# Patient Record
Sex: Female | Born: 1960 | Race: White | Hispanic: No | Marital: Married | Smoking: Never smoker
Health system: Southern US, Community
[De-identification: ages and names within clinical notes are randomized; demographics above are authoritative.]

## PROBLEM LIST (undated history)

## (undated) DIAGNOSIS — F319 Bipolar disorder, unspecified: Secondary | ICD-10-CM

## (undated) DIAGNOSIS — T7840XA Allergy, unspecified, initial encounter: Secondary | ICD-10-CM

## (undated) DIAGNOSIS — I10 Essential (primary) hypertension: Secondary | ICD-10-CM

## (undated) DIAGNOSIS — F32A Depression, unspecified: Secondary | ICD-10-CM

## (undated) DIAGNOSIS — K219 Gastro-esophageal reflux disease without esophagitis: Secondary | ICD-10-CM

## (undated) DIAGNOSIS — K589 Irritable bowel syndrome without diarrhea: Secondary | ICD-10-CM

## (undated) DIAGNOSIS — M545 Low back pain, unspecified: Secondary | ICD-10-CM

## (undated) DIAGNOSIS — S3992XA Unspecified injury of lower back, initial encounter: Secondary | ICD-10-CM

## (undated) DIAGNOSIS — G8929 Other chronic pain: Secondary | ICD-10-CM

## (undated) DIAGNOSIS — R51 Headache: Secondary | ICD-10-CM

## (undated) DIAGNOSIS — M797 Fibromyalgia: Secondary | ICD-10-CM

## (undated) DIAGNOSIS — F329 Major depressive disorder, single episode, unspecified: Secondary | ICD-10-CM

## (undated) HISTORY — DX: Allergy, unspecified, initial encounter: T78.40XA

## (undated) HISTORY — DX: Low back pain: M54.5

## (undated) HISTORY — PX: CARPAL TUNNEL RELEASE: SHX101

## (undated) HISTORY — PX: NASAL SINUS SURGERY: SHX719

## (undated) HISTORY — DX: Gastro-esophageal reflux disease without esophagitis: K21.9

## (undated) HISTORY — PX: OTHER SURGICAL HISTORY: SHX169

## (undated) HISTORY — DX: Depression, unspecified: F32.A

## (undated) HISTORY — DX: Irritable bowel syndrome, unspecified: K58.9

## (undated) HISTORY — DX: Headache: R51

## (undated) HISTORY — PX: CERVICAL SPINE SURGERY: SHX589

## (undated) HISTORY — DX: Essential (primary) hypertension: I10

## (undated) HISTORY — DX: Other chronic pain: G89.29

## (undated) HISTORY — DX: Fibromyalgia: M79.7

## (undated) HISTORY — DX: Low back pain, unspecified: M54.50

## (undated) HISTORY — DX: Bipolar disorder, unspecified: F31.9

## (undated) HISTORY — PX: TRIGGER FINGER RELEASE: SHX641

## (undated) HISTORY — DX: Major depressive disorder, single episode, unspecified: F32.9

## (undated) HISTORY — PX: CHOLECYSTECTOMY: SHX55

## (undated) HISTORY — PX: APPENDECTOMY: SHX54

## (undated) HISTORY — DX: Unspecified injury of lower back, initial encounter: S39.92XA

---

## 1994-09-20 HISTORY — PX: OTHER SURGICAL HISTORY: SHX169

## 1998-01-21 ENCOUNTER — Other Ambulatory Visit: Admission: RE | Admit: 1998-01-21 | Discharge: 1998-01-21 | Payer: Self-pay | Admitting: Gynecology

## 1998-07-28 ENCOUNTER — Emergency Department (HOSPITAL_COMMUNITY): Admission: EM | Admit: 1998-07-28 | Discharge: 1998-07-28 | Payer: Self-pay | Admitting: Emergency Medicine

## 1998-07-29 ENCOUNTER — Encounter: Payer: Self-pay | Admitting: Emergency Medicine

## 1998-07-29 ENCOUNTER — Emergency Department (HOSPITAL_COMMUNITY): Admission: RE | Admit: 1998-07-29 | Discharge: 1998-07-29 | Payer: Self-pay | Admitting: Emergency Medicine

## 1999-01-21 ENCOUNTER — Other Ambulatory Visit: Admission: RE | Admit: 1999-01-21 | Discharge: 1999-01-21 | Payer: Self-pay | Admitting: Gynecology

## 1999-05-27 ENCOUNTER — Ambulatory Visit (HOSPITAL_COMMUNITY): Admission: RE | Admit: 1999-05-27 | Discharge: 1999-05-27 | Payer: Self-pay | Admitting: Gastroenterology

## 1999-05-27 ENCOUNTER — Encounter: Payer: Self-pay | Admitting: Gastroenterology

## 1999-06-05 ENCOUNTER — Encounter: Payer: Self-pay | Admitting: Gastroenterology

## 1999-06-05 ENCOUNTER — Ambulatory Visit (HOSPITAL_COMMUNITY): Admission: RE | Admit: 1999-06-05 | Discharge: 1999-06-05 | Payer: Self-pay | Admitting: Gastroenterology

## 1999-08-31 ENCOUNTER — Ambulatory Visit (HOSPITAL_BASED_OUTPATIENT_CLINIC_OR_DEPARTMENT_OTHER): Admission: RE | Admit: 1999-08-31 | Discharge: 1999-08-31 | Payer: Self-pay | Admitting: Orthopedic Surgery

## 1999-11-09 ENCOUNTER — Ambulatory Visit: Admission: RE | Admit: 1999-11-09 | Discharge: 1999-11-09 | Payer: Self-pay | Admitting: *Deleted

## 2000-02-02 ENCOUNTER — Other Ambulatory Visit: Admission: RE | Admit: 2000-02-02 | Discharge: 2000-02-02 | Payer: Self-pay | Admitting: Gynecology

## 2000-12-15 ENCOUNTER — Encounter: Payer: Self-pay | Admitting: Gastroenterology

## 2000-12-15 ENCOUNTER — Encounter: Admission: RE | Admit: 2000-12-15 | Discharge: 2000-12-15 | Payer: Self-pay | Admitting: Gastroenterology

## 2001-01-18 ENCOUNTER — Ambulatory Visit (HOSPITAL_COMMUNITY): Admission: RE | Admit: 2001-01-18 | Discharge: 2001-01-18 | Payer: Self-pay | Admitting: Gastroenterology

## 2001-01-18 ENCOUNTER — Encounter: Payer: Self-pay | Admitting: Gastroenterology

## 2001-01-18 ENCOUNTER — Encounter: Admission: RE | Admit: 2001-01-18 | Discharge: 2001-04-18 | Payer: Self-pay | Admitting: Anesthesiology

## 2001-02-10 ENCOUNTER — Encounter (INDEPENDENT_AMBULATORY_CARE_PROVIDER_SITE_OTHER): Payer: Self-pay | Admitting: *Deleted

## 2001-02-10 ENCOUNTER — Ambulatory Visit (HOSPITAL_COMMUNITY): Admission: RE | Admit: 2001-02-10 | Discharge: 2001-02-11 | Payer: Self-pay | Admitting: General Surgery

## 2001-02-10 ENCOUNTER — Encounter (HOSPITAL_BASED_OUTPATIENT_CLINIC_OR_DEPARTMENT_OTHER): Payer: Self-pay | Admitting: General Surgery

## 2001-03-21 ENCOUNTER — Other Ambulatory Visit: Admission: RE | Admit: 2001-03-21 | Discharge: 2001-03-21 | Payer: Self-pay | Admitting: Gynecology

## 2002-05-02 ENCOUNTER — Other Ambulatory Visit: Admission: RE | Admit: 2002-05-02 | Discharge: 2002-05-02 | Payer: Self-pay | Admitting: Gynecology

## 2002-07-26 ENCOUNTER — Encounter: Payer: Self-pay | Admitting: Emergency Medicine

## 2002-07-26 ENCOUNTER — Emergency Department (HOSPITAL_COMMUNITY): Admission: EM | Admit: 2002-07-26 | Discharge: 2002-07-26 | Payer: Self-pay | Admitting: Emergency Medicine

## 2003-05-29 ENCOUNTER — Other Ambulatory Visit: Admission: RE | Admit: 2003-05-29 | Discharge: 2003-05-29 | Payer: Self-pay | Admitting: Gynecology

## 2003-11-29 ENCOUNTER — Encounter: Admission: RE | Admit: 2003-11-29 | Discharge: 2003-11-29 | Payer: Self-pay | Admitting: Orthopedic Surgery

## 2003-12-10 ENCOUNTER — Encounter: Admission: RE | Admit: 2003-12-10 | Discharge: 2003-12-10 | Payer: Self-pay | Admitting: Orthopedic Surgery

## 2004-02-27 ENCOUNTER — Ambulatory Visit (HOSPITAL_COMMUNITY): Admission: RE | Admit: 2004-02-27 | Discharge: 2004-02-28 | Payer: Self-pay | Admitting: Orthopaedic Surgery

## 2004-05-19 ENCOUNTER — Other Ambulatory Visit: Admission: RE | Admit: 2004-05-19 | Discharge: 2004-05-19 | Payer: Self-pay | Admitting: Gynecology

## 2004-07-28 ENCOUNTER — Ambulatory Visit: Payer: Self-pay | Admitting: Family Medicine

## 2004-08-05 ENCOUNTER — Encounter: Admission: RE | Admit: 2004-08-05 | Discharge: 2004-08-05 | Payer: Self-pay | Admitting: Family Medicine

## 2004-08-10 ENCOUNTER — Ambulatory Visit: Payer: Self-pay | Admitting: Family Medicine

## 2004-10-15 ENCOUNTER — Ambulatory Visit: Payer: Self-pay | Admitting: Family Medicine

## 2004-11-14 ENCOUNTER — Ambulatory Visit: Payer: Self-pay | Admitting: Family Medicine

## 2005-01-06 ENCOUNTER — Ambulatory Visit: Payer: Self-pay | Admitting: Family Medicine

## 2005-03-20 IMAGING — CR DG CHEST 2V
2 series · 2 of 2 positions shown · non-contrast
Comparison: none

CLINICAL DATA: Asthma.  Hypertension, C5-6 disk herniation, preoperative evaluation.
 CHEST TWO VIEWS
 Comparison report from [HOSPITAL] on 07/26/02.
 Normal sized heart.  Diffuse accentuation of the interstitial markings.  Cholecystectomy clips.  Unremarkable bones.  
 IMPRESSION
 Mild to moderate chronic interstitial lung disease.

[view not recorded (1 of 2)]
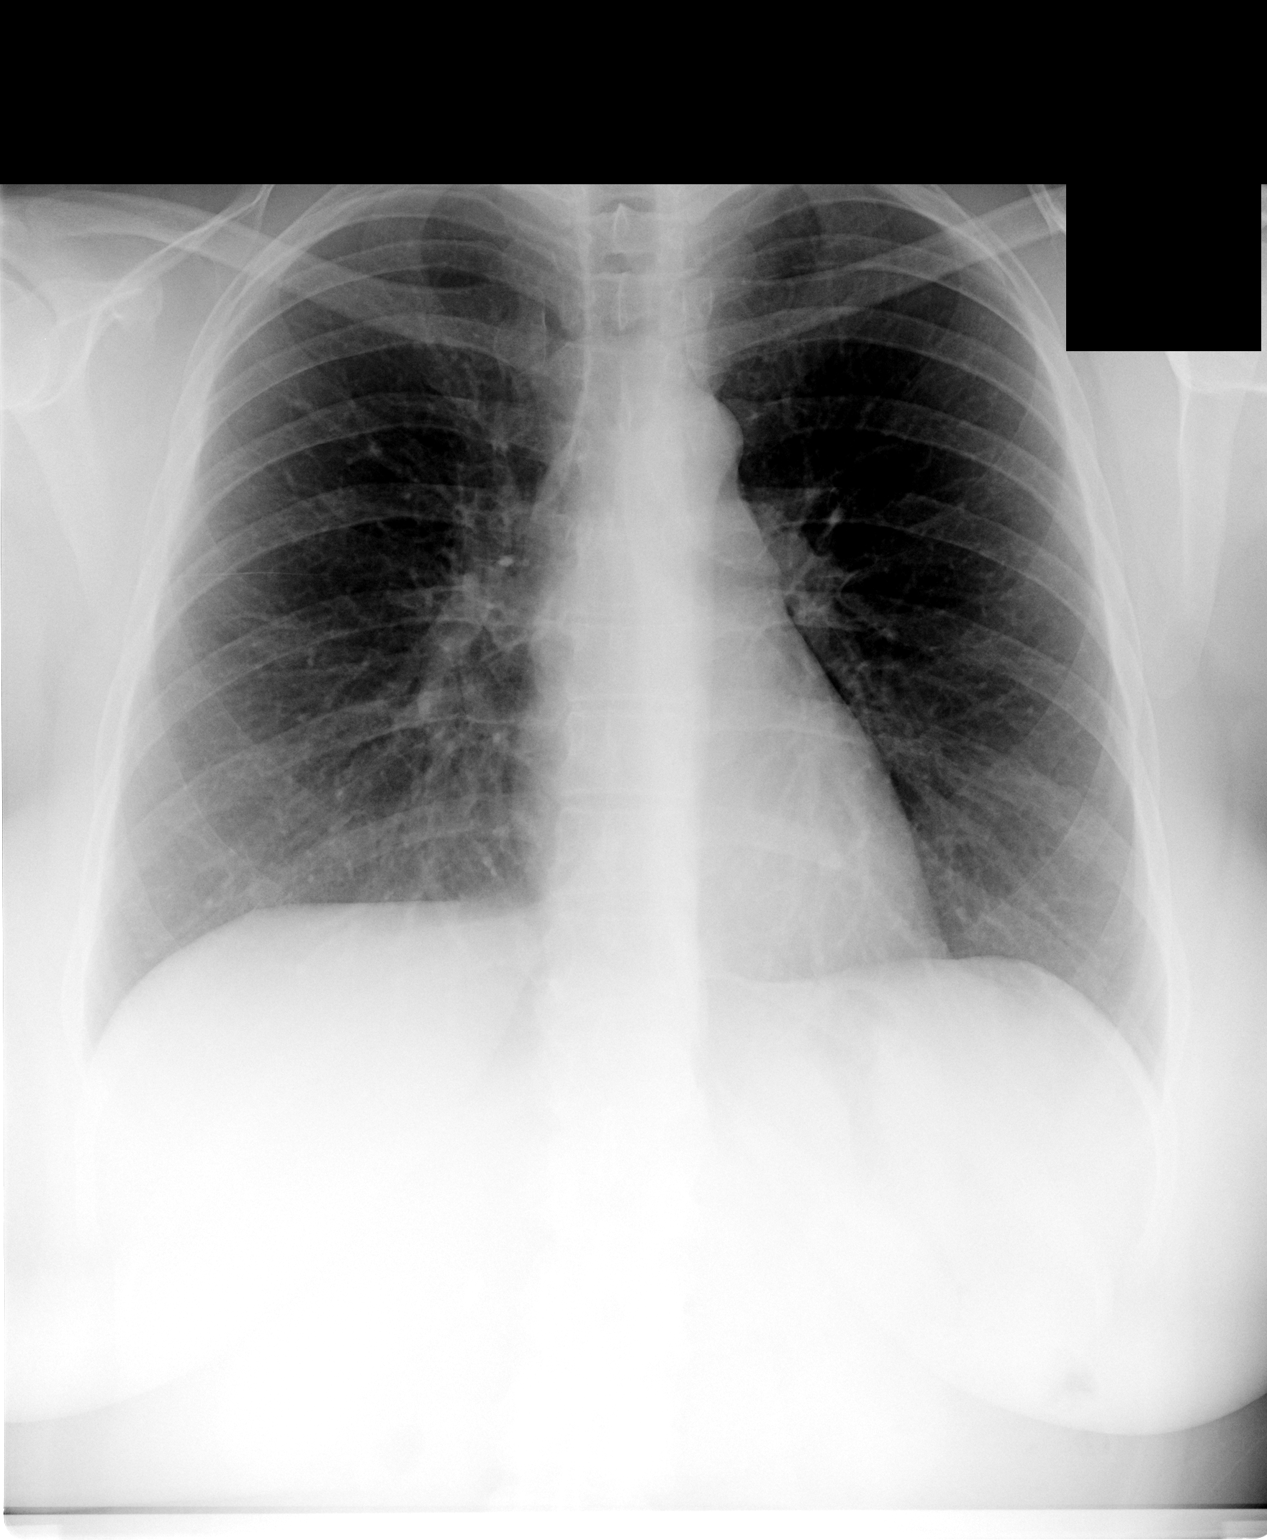

[view not recorded (2 of 2)]
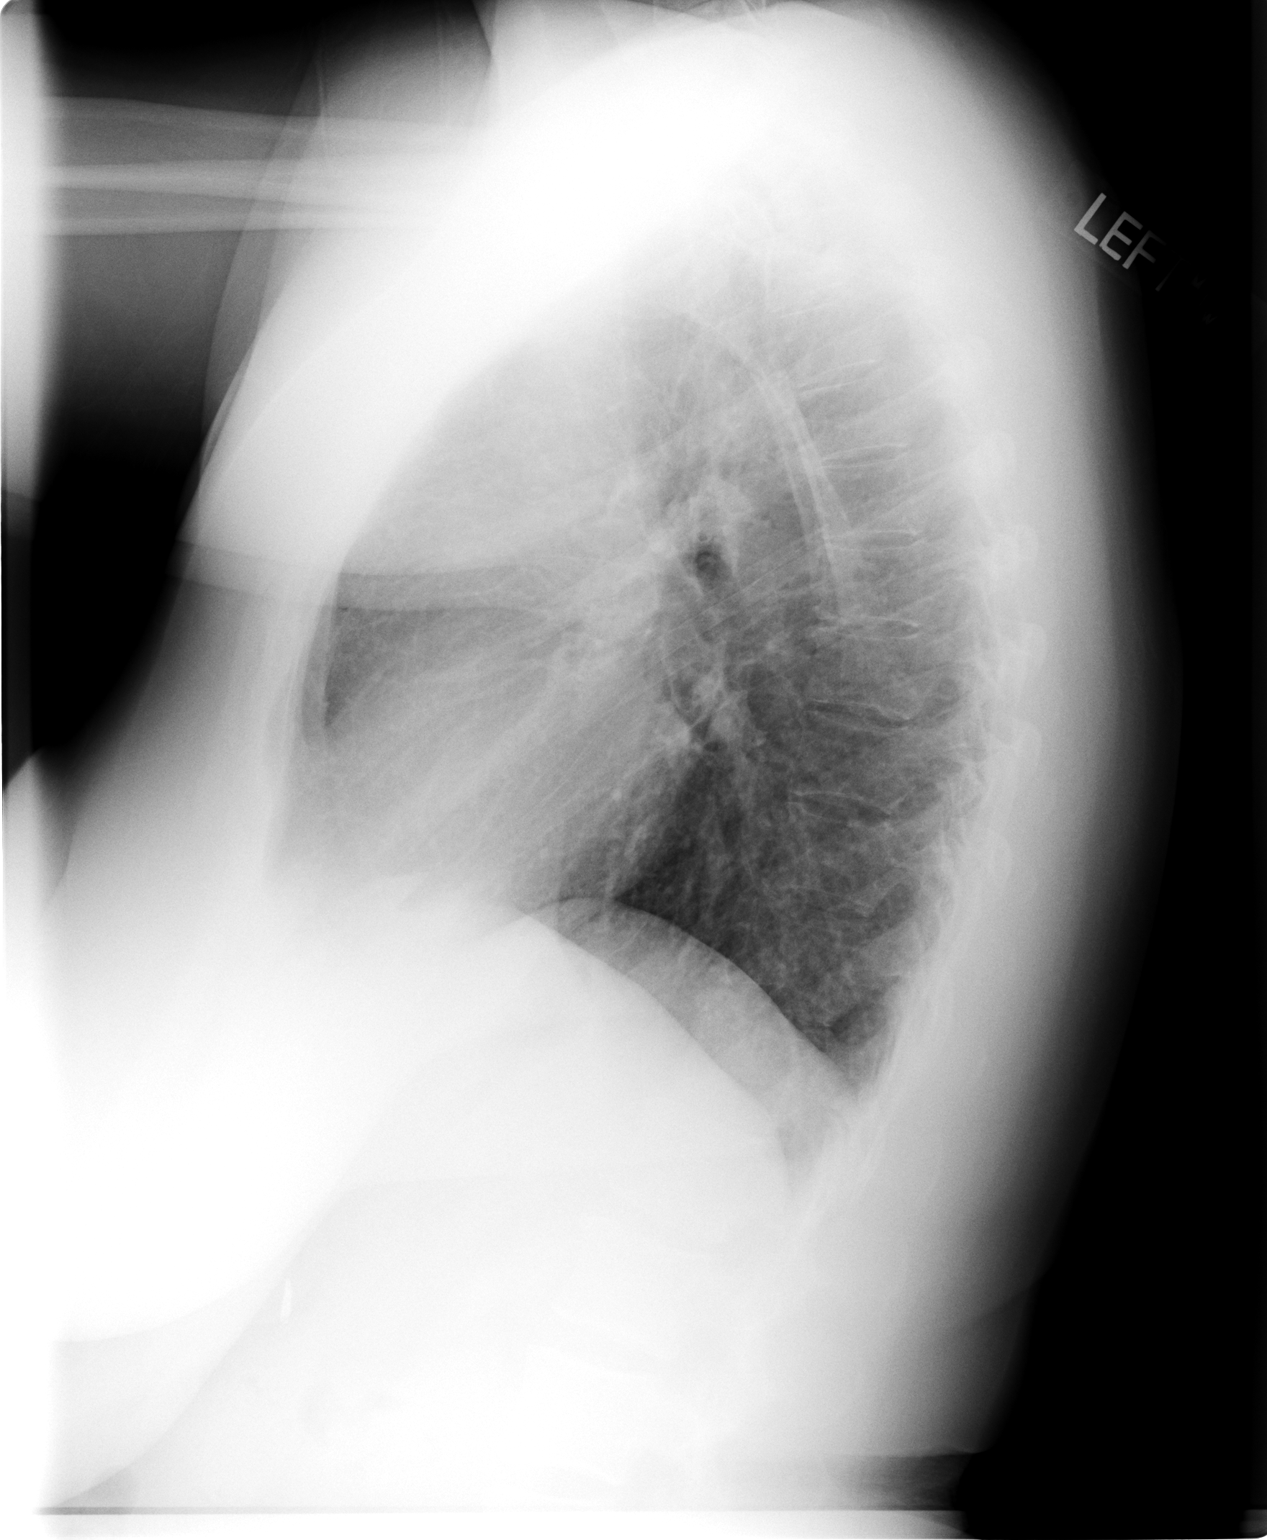

[2 of 2 positions shown; findings below may reference images not displayed]

## 2005-04-02 ENCOUNTER — Ambulatory Visit: Payer: Self-pay | Admitting: Family Medicine

## 2005-06-03 ENCOUNTER — Other Ambulatory Visit: Admission: RE | Admit: 2005-06-03 | Discharge: 2005-06-03 | Payer: Self-pay | Admitting: Gynecology

## 2005-06-24 ENCOUNTER — Ambulatory Visit: Payer: Self-pay | Admitting: Family Medicine

## 2005-07-07 ENCOUNTER — Ambulatory Visit: Payer: Self-pay | Admitting: Family Medicine

## 2005-07-09 ENCOUNTER — Ambulatory Visit: Payer: Self-pay | Admitting: Family Medicine

## 2005-08-05 ENCOUNTER — Ambulatory Visit: Payer: Self-pay | Admitting: Family Medicine

## 2005-09-15 ENCOUNTER — Ambulatory Visit: Payer: Self-pay | Admitting: Family Medicine

## 2005-09-23 DIAGNOSIS — S3992XA Unspecified injury of lower back, initial encounter: Secondary | ICD-10-CM

## 2005-09-23 HISTORY — DX: Unspecified injury of lower back, initial encounter: S39.92XA

## 2005-12-07 ENCOUNTER — Ambulatory Visit: Payer: Self-pay | Admitting: Family Medicine

## 2006-02-28 ENCOUNTER — Ambulatory Visit: Payer: Self-pay | Admitting: Family Medicine

## 2006-04-27 ENCOUNTER — Ambulatory Visit: Payer: Self-pay | Admitting: Family Medicine

## 2006-06-20 ENCOUNTER — Other Ambulatory Visit: Admission: RE | Admit: 2006-06-20 | Discharge: 2006-06-20 | Payer: Self-pay | Admitting: Gynecology

## 2006-07-01 ENCOUNTER — Ambulatory Visit (HOSPITAL_BASED_OUTPATIENT_CLINIC_OR_DEPARTMENT_OTHER): Admission: RE | Admit: 2006-07-01 | Discharge: 2006-07-01 | Payer: Self-pay | Admitting: Orthopedic Surgery

## 2006-07-11 ENCOUNTER — Ambulatory Visit: Payer: Self-pay | Admitting: Family Medicine

## 2006-07-11 LAB — CONVERTED CEMR LAB
ALT: 23 units/L (ref 0–40)
AST: 21 units/L (ref 0–37)
Albumin: 4.3 g/dL (ref 3.5–5.2)
Alkaline Phosphatase: 78 units/L (ref 39–117)
BUN: 7 mg/dL (ref 6–23)
Basophils Absolute: 0 10*3/uL (ref 0.0–0.1)
Basophils Relative: 0.5 % (ref 0.0–1.0)
CO2: 28 meq/L (ref 19–32)
Calcium: 9.7 mg/dL (ref 8.4–10.5)
Chloride: 111 meq/L (ref 96–112)
Chol/HDL Ratio, serum: 5.3
Cholesterol: 170 mg/dL (ref 0–200)
Creatinine, Ser: 1 mg/dL (ref 0.4–1.2)
Eosinophil percent: 0.8 % (ref 0.0–5.0)
GFR calc non Af Amer: 64 mL/min
Glomerular Filtration Rate, Af Am: 78 mL/min/{1.73_m2}
Glucose, Bld: 87 mg/dL (ref 70–99)
HCT: 43.4 % (ref 36.0–46.0)
HDL: 31.8 mg/dL — ABNORMAL LOW (ref >39.0)
Hemoglobin: 14.5 g/dL (ref 12.0–15.0)
LDL Cholesterol: 111 mg/dL — ABNORMAL HIGH (ref 0–99)
Lymphocytes Relative: 35.5 % (ref 12.0–46.0)
MCHC: 33.3 g/dL (ref 30.0–36.0)
MCV: 93.2 fL (ref 78.0–100.0)
Monocytes Absolute: 0.7 10*3/uL (ref 0.2–0.7)
Monocytes Relative: 8.1 % (ref 3.0–11.0)
Neutro Abs: 4.9 10*3/uL (ref 1.4–7.7)
Neutrophils Relative %: 55.1 % (ref 43.0–77.0)
Platelets: 310 10*3/uL (ref 150–400)
Potassium: 4 meq/L (ref 3.5–5.1)
RBC: 4.66 M/uL (ref 3.87–5.11)
RDW: 12.3 % (ref 11.5–14.6)
Sodium: 145 meq/L (ref 135–145)
TSH: 2.35 microintl units/mL (ref 0.35–5.50)
Total Bilirubin: 0.6 mg/dL (ref 0.3–1.2)
Total Protein: 7.1 g/dL (ref 6.0–8.3)
Triglyceride fasting, serum: 136 mg/dL (ref 0–149)
VLDL: 27 mg/dL (ref 0–40)
WBC: 8.8 10*3/uL (ref 4.5–10.5)

## 2006-07-19 ENCOUNTER — Ambulatory Visit: Payer: Self-pay | Admitting: Family Medicine

## 2006-08-03 ENCOUNTER — Ambulatory Visit: Payer: Self-pay | Admitting: Pain Medicine

## 2006-08-16 ENCOUNTER — Ambulatory Visit: Payer: Self-pay | Admitting: Family Medicine

## 2006-10-10 ENCOUNTER — Ambulatory Visit: Payer: Self-pay | Admitting: Family Medicine

## 2006-10-14 ENCOUNTER — Ambulatory Visit: Payer: Self-pay | Admitting: Family Medicine

## 2006-10-17 ENCOUNTER — Ambulatory Visit: Payer: Self-pay | Admitting: Family Medicine

## 2006-11-07 ENCOUNTER — Ambulatory Visit: Payer: Self-pay | Admitting: Family Medicine

## 2006-11-09 ENCOUNTER — Ambulatory Visit: Payer: Self-pay | Admitting: Pain Medicine

## 2006-11-17 ENCOUNTER — Ambulatory Visit: Payer: Self-pay | Admitting: Pain Medicine

## 2006-11-30 ENCOUNTER — Encounter: Payer: Self-pay | Admitting: Pain Medicine

## 2006-12-05 ENCOUNTER — Ambulatory Visit: Payer: Self-pay | Admitting: Physician Assistant

## 2006-12-20 ENCOUNTER — Encounter: Payer: Self-pay | Admitting: Pain Medicine

## 2006-12-22 ENCOUNTER — Ambulatory Visit: Payer: Self-pay | Admitting: Pain Medicine

## 2007-01-04 ENCOUNTER — Ambulatory Visit: Payer: Self-pay | Admitting: Physician Assistant

## 2007-01-19 ENCOUNTER — Ambulatory Visit: Payer: Self-pay | Admitting: Pain Medicine

## 2007-01-26 ENCOUNTER — Ambulatory Visit: Payer: Self-pay | Admitting: Pain Medicine

## 2007-01-30 ENCOUNTER — Ambulatory Visit: Payer: Self-pay | Admitting: Family Medicine

## 2007-02-28 ENCOUNTER — Ambulatory Visit: Payer: Self-pay | Admitting: Physician Assistant

## 2007-03-13 DIAGNOSIS — J309 Allergic rhinitis, unspecified: Secondary | ICD-10-CM | POA: Insufficient documentation

## 2007-03-13 DIAGNOSIS — F329 Major depressive disorder, single episode, unspecified: Secondary | ICD-10-CM

## 2007-03-13 DIAGNOSIS — R51 Headache: Secondary | ICD-10-CM

## 2007-03-13 DIAGNOSIS — J45909 Unspecified asthma, uncomplicated: Secondary | ICD-10-CM | POA: Insufficient documentation

## 2007-03-13 DIAGNOSIS — R519 Headache, unspecified: Secondary | ICD-10-CM | POA: Insufficient documentation

## 2007-03-13 DIAGNOSIS — K589 Irritable bowel syndrome without diarrhea: Secondary | ICD-10-CM

## 2007-03-13 DIAGNOSIS — M797 Fibromyalgia: Secondary | ICD-10-CM | POA: Insufficient documentation

## 2007-03-13 DIAGNOSIS — A6000 Herpesviral infection of urogenital system, unspecified: Secondary | ICD-10-CM | POA: Insufficient documentation

## 2007-03-29 ENCOUNTER — Ambulatory Visit: Admit: 2007-03-29 | Disposition: A | Discharge status: Home / Self Care | Payer: Self-pay | Admitting: Pain Medicine

## 2007-03-29 ENCOUNTER — Ambulatory Visit: Payer: Self-pay | Admitting: Physician Assistant

## 2007-04-06 ENCOUNTER — Ambulatory Visit: Payer: Self-pay | Admitting: Pain Medicine

## 2007-04-24 ENCOUNTER — Ambulatory Visit: Payer: Self-pay | Admitting: Family Medicine

## 2007-04-24 DIAGNOSIS — E876 Hypokalemia: Secondary | ICD-10-CM

## 2007-04-24 DIAGNOSIS — B370 Candidal stomatitis: Secondary | ICD-10-CM

## 2007-04-26 LAB — CONVERTED CEMR LAB
BUN: 10 mg/dL (ref 6–23)
CO2: 25 meq/L (ref 19–32)
Calcium: 9.2 mg/dL (ref 8.4–10.5)
Chloride: 109 meq/L (ref 96–112)
Creatinine, Ser: 0.8 mg/dL (ref 0.4–1.2)
GFR calc Af Amer: 100 mL/min
GFR calc non Af Amer: 83 mL/min
Glucose, Bld: 80 mg/dL (ref 70–99)
Potassium: 4.1 meq/L (ref 3.5–5.1)
Sodium: 142 meq/L (ref 135–145)

## 2007-04-27 ENCOUNTER — Ambulatory Visit: Payer: Self-pay | Admitting: Physician Assistant

## 2007-06-06 ENCOUNTER — Encounter: Payer: Self-pay | Admitting: Maternal & Fetal Medicine

## 2007-06-12 ENCOUNTER — Ambulatory Visit: Payer: Self-pay | Admitting: Physician Assistant

## 2007-06-21 ENCOUNTER — Encounter: Payer: Self-pay | Admitting: Maternal & Fetal Medicine

## 2007-06-26 ENCOUNTER — Telehealth: Payer: Self-pay | Admitting: Family Medicine

## 2007-06-27 ENCOUNTER — Telehealth: Payer: Self-pay | Admitting: Family Medicine

## 2007-06-28 ENCOUNTER — Telehealth: Payer: Self-pay | Admitting: Family Medicine

## 2007-07-11 ENCOUNTER — Ambulatory Visit: Payer: Self-pay | Admitting: Pain Medicine

## 2007-07-14 ENCOUNTER — Ambulatory Visit: Payer: Self-pay | Admitting: Family Medicine

## 2007-07-18 LAB — CONVERTED CEMR LAB
ALT: 116 units/L — ABNORMAL HIGH (ref 0–35)
AST: 115 units/L — ABNORMAL HIGH (ref 0–37)
Albumin: 4 g/dL (ref 3.5–5.2)
Alkaline Phosphatase: 90 units/L (ref 39–117)
BUN: 7 mg/dL (ref 6–23)
Basophils Absolute: 0 10*3/uL (ref 0.0–0.1)
Basophils Relative: 0 % (ref 0.0–1.0)
Bilirubin, Direct: 0.2 mg/dL (ref 0.0–0.3)
CO2: 29 meq/L (ref 19–32)
Calcium: 9.5 mg/dL (ref 8.4–10.5)
Chloride: 104 meq/L (ref 96–112)
Cholesterol: 176 mg/dL (ref 0–200)
Creatinine, Ser: 0.8 mg/dL (ref 0.4–1.2)
Direct LDL: 105.7 mg/dL
Eosinophils Absolute: 0 10*3/uL (ref 0.0–0.6)
Eosinophils Relative: 0.1 % (ref 0.0–5.0)
GFR calc Af Amer: 100 mL/min
GFR calc non Af Amer: 83 mL/min
Glucose, Bld: 93 mg/dL (ref 70–99)
HCT: 41.5 % (ref 36.0–46.0)
HDL: 38.8 mg/dL — ABNORMAL LOW (ref >39.0)
Hemoglobin: 14.7 g/dL (ref 12.0–15.0)
Lymphocytes Relative: 22.2 % (ref 12.0–46.0)
MCHC: 35.4 g/dL (ref 30.0–36.0)
MCV: 90.5 fL (ref 78.0–100.0)
Monocytes Absolute: 1.1 10*3/uL — ABNORMAL HIGH (ref 0.2–0.7)
Monocytes Relative: 8.7 % (ref 3.0–11.0)
Neutro Abs: 8.5 10*3/uL — ABNORMAL HIGH (ref 1.4–7.7)
Neutrophils Relative %: 69 % (ref 43.0–77.0)
Platelets: 321 10*3/uL (ref 150–400)
Potassium: 5 meq/L (ref 3.5–5.1)
RBC: 4.59 M/uL (ref 3.87–5.11)
RDW: 11.5 % (ref 11.5–14.6)
Sodium: 138 meq/L (ref 135–145)
TSH: 1.41 microintl units/mL (ref 0.35–5.50)
Total Bilirubin: 0.6 mg/dL (ref 0.3–1.2)
Total CHOL/HDL Ratio: 4.5
Total Protein: 6.8 g/dL (ref 6.0–8.3)
Triglycerides: 229 mg/dL (ref 0–149)
VLDL: 46 mg/dL — ABNORMAL HIGH (ref 0–40)
WBC: 12.4 10*3/uL — ABNORMAL HIGH (ref 4.5–10.5)

## 2007-07-22 ENCOUNTER — Encounter: Payer: Self-pay | Admitting: Maternal & Fetal Medicine

## 2007-08-07 ENCOUNTER — Other Ambulatory Visit: Admission: RE | Admit: 2007-08-07 | Discharge: 2007-08-07 | Payer: Self-pay | Admitting: Gynecology

## 2007-08-09 ENCOUNTER — Ambulatory Visit: Payer: Self-pay | Admitting: Physician Assistant

## 2007-08-09 ENCOUNTER — Telehealth: Payer: Self-pay | Admitting: Family Medicine

## 2007-08-14 ENCOUNTER — Ambulatory Visit: Payer: Self-pay | Admitting: Family Medicine

## 2007-08-14 DIAGNOSIS — N318 Other neuromuscular dysfunction of bladder: Secondary | ICD-10-CM | POA: Insufficient documentation

## 2007-08-14 LAB — CONVERTED CEMR LAB
Bilirubin Urine: NEGATIVE
Blood in Urine, dipstick: NEGATIVE
Glucose, Urine, Semiquant: NEGATIVE
Ketones, urine, test strip: NEGATIVE
Nitrite: NEGATIVE
Protein, U semiquant: NEGATIVE
Specific Gravity, Urine: <1.005
Urobilinogen, UA: 0.2
WBC Urine, dipstick: NEGATIVE
pH: 6

## 2007-08-23 ENCOUNTER — Encounter: Payer: Self-pay | Admitting: Family Medicine

## 2007-08-30 ENCOUNTER — Ambulatory Visit: Payer: Self-pay | Admitting: Family Medicine

## 2007-08-30 DIAGNOSIS — K219 Gastro-esophageal reflux disease without esophagitis: Secondary | ICD-10-CM

## 2007-09-01 ENCOUNTER — Telehealth: Payer: Self-pay | Admitting: Family Medicine

## 2007-09-05 ENCOUNTER — Ambulatory Visit: Payer: Self-pay | Admitting: Physician Assistant

## 2007-09-07 ENCOUNTER — Telehealth: Payer: Self-pay | Admitting: Family Medicine

## 2007-09-18 ENCOUNTER — Encounter: Payer: Self-pay | Admitting: Family Medicine

## 2007-09-18 ENCOUNTER — Telehealth (INDEPENDENT_AMBULATORY_CARE_PROVIDER_SITE_OTHER): Payer: Self-pay | Admitting: *Deleted

## 2007-09-29 ENCOUNTER — Encounter: Payer: Self-pay | Admitting: Family Medicine

## 2007-10-06 ENCOUNTER — Ambulatory Visit: Payer: Self-pay | Admitting: Family Medicine

## 2007-10-06 ENCOUNTER — Telehealth: Payer: Self-pay | Admitting: Family Medicine

## 2007-10-12 ENCOUNTER — Ambulatory Visit: Payer: Self-pay | Admitting: Pain Medicine

## 2007-10-16 ENCOUNTER — Telehealth: Payer: Self-pay | Admitting: Family Medicine

## 2007-10-18 ENCOUNTER — Encounter: Payer: Self-pay | Admitting: Family Medicine

## 2007-10-19 ENCOUNTER — Telehealth: Payer: Self-pay | Admitting: Family Medicine

## 2007-11-09 ENCOUNTER — Ambulatory Visit: Payer: Self-pay | Admitting: Physician Assistant

## 2007-11-30 ENCOUNTER — Ambulatory Visit: Payer: Self-pay | Admitting: Pain Medicine

## 2007-12-06 ENCOUNTER — Ambulatory Visit: Payer: Self-pay | Admitting: Physician Assistant

## 2007-12-11 ENCOUNTER — Encounter: Payer: Self-pay | Admitting: Pain Medicine

## 2007-12-20 ENCOUNTER — Encounter: Payer: Self-pay | Admitting: Pain Medicine

## 2007-12-22 ENCOUNTER — Ambulatory Visit: Payer: Self-pay | Admitting: Family Medicine

## 2007-12-22 ENCOUNTER — Telehealth: Payer: Self-pay | Admitting: Family Medicine

## 2008-01-09 ENCOUNTER — Ambulatory Visit: Payer: Self-pay | Admitting: Physician Assistant

## 2008-01-19 ENCOUNTER — Encounter: Payer: Self-pay | Admitting: Pain Medicine

## 2008-02-06 ENCOUNTER — Encounter: Payer: Self-pay | Admitting: Family Medicine

## 2008-02-06 ENCOUNTER — Encounter: Admission: RE | Admit: 2008-02-06 | Discharge: 2008-02-06 | Payer: Self-pay | Admitting: Family Medicine

## 2008-02-07 ENCOUNTER — Encounter: Payer: Self-pay | Admitting: Family Medicine

## 2008-02-07 ENCOUNTER — Ambulatory Visit: Payer: Self-pay | Admitting: Physician Assistant

## 2008-02-14 ENCOUNTER — Encounter: Payer: Self-pay | Admitting: Family Medicine

## 2008-02-19 ENCOUNTER — Encounter: Payer: Self-pay | Admitting: Pain Medicine

## 2008-03-06 ENCOUNTER — Ambulatory Visit: Payer: Self-pay | Admitting: Physician Assistant

## 2008-03-07 ENCOUNTER — Ambulatory Visit: Payer: Self-pay | Admitting: Family Medicine

## 2008-04-04 ENCOUNTER — Ambulatory Visit: Payer: Self-pay | Admitting: Physician Assistant

## 2008-04-11 ENCOUNTER — Ambulatory Visit: Payer: Self-pay | Admitting: Pain Medicine

## 2008-05-08 ENCOUNTER — Ambulatory Visit: Payer: Self-pay | Admitting: Physician Assistant

## 2008-05-29 ENCOUNTER — Ambulatory Visit: Payer: Self-pay | Admitting: Family Medicine

## 2008-05-29 DIAGNOSIS — R1031 Right lower quadrant pain: Secondary | ICD-10-CM

## 2008-05-29 LAB — CONVERTED CEMR LAB
Blood in Urine, dipstick: NEGATIVE
Glucose, Urine, Semiquant: NEGATIVE
Nitrite: NEGATIVE
Specific Gravity, Urine: >=1.03
Urobilinogen, UA: 1
WBC Urine, dipstick: NEGATIVE
pH: 5.5

## 2008-05-30 ENCOUNTER — Encounter (INDEPENDENT_AMBULATORY_CARE_PROVIDER_SITE_OTHER): Payer: Self-pay | Admitting: General Surgery

## 2008-05-30 ENCOUNTER — Inpatient Hospital Stay (HOSPITAL_COMMUNITY): Admission: EM | Admit: 2008-05-30 | Discharge: 2008-05-31 | Payer: Self-pay | Admitting: Emergency Medicine

## 2008-06-12 ENCOUNTER — Ambulatory Visit: Payer: Self-pay | Admitting: Family Medicine

## 2008-06-17 ENCOUNTER — Encounter: Payer: Self-pay | Admitting: Family Medicine

## 2008-06-27 ENCOUNTER — Ambulatory Visit: Payer: Self-pay | Admitting: Family Medicine

## 2008-07-15 ENCOUNTER — Encounter: Payer: Self-pay | Admitting: Family Medicine

## 2008-07-29 ENCOUNTER — Encounter: Payer: Self-pay | Admitting: Family Medicine

## 2008-08-05 ENCOUNTER — Other Ambulatory Visit: Admission: RE | Admit: 2008-08-05 | Discharge: 2008-08-05 | Payer: Self-pay | Admitting: Gynecology

## 2008-08-05 ENCOUNTER — Encounter: Payer: Self-pay | Admitting: Family Medicine

## 2008-08-06 ENCOUNTER — Ambulatory Visit: Payer: Self-pay | Admitting: Physician Assistant

## 2008-08-21 ENCOUNTER — Encounter: Payer: Self-pay | Admitting: Family Medicine

## 2008-09-04 ENCOUNTER — Ambulatory Visit: Payer: Self-pay | Admitting: Physician Assistant

## 2008-09-05 ENCOUNTER — Ambulatory Visit: Payer: Self-pay | Admitting: Family Medicine

## 2008-09-06 LAB — CONVERTED CEMR LAB
ALT: 28 units/L (ref 0–35)
AST: 29 units/L (ref 0–37)
Albumin: 4 g/dL (ref 3.5–5.2)
Alkaline Phosphatase: 86 units/L (ref 39–117)
BUN: 9 mg/dL (ref 6–23)
Basophils Absolute: 0 10*3/uL (ref 0.0–0.1)
Basophils Relative: 0.6 % (ref 0.0–3.0)
Bilirubin, Direct: 0.1 mg/dL (ref 0.0–0.3)
CO2: 25 meq/L (ref 19–32)
Calcium: 9.2 mg/dL (ref 8.4–10.5)
Chloride: 110 meq/L (ref 96–112)
Cholesterol: 189 mg/dL (ref 0–200)
Creatinine, Ser: 0.9 mg/dL (ref 0.4–1.2)
Eosinophils Absolute: 0.1 10*3/uL (ref 0.0–0.7)
Eosinophils Relative: 1.4 % (ref 0.0–5.0)
GFR calc Af Amer: 87 mL/min
GFR calc non Af Amer: 72 mL/min
Glucose, Bld: 93 mg/dL (ref 70–99)
HCT: 41.3 % (ref 36.0–46.0)
HDL: 31.6 mg/dL — ABNORMAL LOW (ref >39.0)
Hemoglobin: 14.3 g/dL (ref 12.0–15.0)
LDL Cholesterol: 122 mg/dL — ABNORMAL HIGH (ref 0–99)
Lymphocytes Relative: 36.6 % (ref 12.0–46.0)
MCHC: 34.5 g/dL (ref 30.0–36.0)
MCV: 90.5 fL (ref 78.0–100.0)
Monocytes Absolute: 0.8 10*3/uL (ref 0.1–1.0)
Monocytes Relative: 10.5 % (ref 3.0–12.0)
Neutro Abs: 3.7 10*3/uL (ref 1.4–7.7)
Neutrophils Relative %: 50.9 % (ref 43.0–77.0)
Platelets: 243 10*3/uL (ref 150–400)
Potassium: 4 meq/L (ref 3.5–5.1)
RBC: 4.57 M/uL (ref 3.87–5.11)
RDW: 12.5 % (ref 11.5–14.6)
Sodium: 142 meq/L (ref 135–145)
TSH: 2.67 microintl units/mL (ref 0.35–5.50)
Total Bilirubin: 0.7 mg/dL (ref 0.3–1.2)
Total CHOL/HDL Ratio: 6
Total Protein: 6.5 g/dL (ref 6.0–8.3)
Triglycerides: 176 mg/dL — ABNORMAL HIGH (ref 0–149)
VLDL: 35 mg/dL (ref 0–40)
WBC: 7.2 10*3/uL (ref 4.5–10.5)

## 2008-09-09 ENCOUNTER — Ambulatory Visit: Payer: Self-pay | Admitting: Family Medicine

## 2008-10-07 ENCOUNTER — Ambulatory Visit: Payer: Self-pay | Admitting: Family Medicine

## 2008-10-07 DIAGNOSIS — I10 Essential (primary) hypertension: Secondary | ICD-10-CM

## 2008-11-07 ENCOUNTER — Ambulatory Visit: Payer: Self-pay | Admitting: Physician Assistant

## 2008-11-27 ENCOUNTER — Ambulatory Visit: Payer: Self-pay | Admitting: Family Medicine

## 2008-11-28 ENCOUNTER — Encounter: Payer: Self-pay | Admitting: Family Medicine

## 2008-11-29 ENCOUNTER — Telehealth: Payer: Self-pay | Admitting: Family Medicine

## 2008-12-04 ENCOUNTER — Telehealth: Payer: Self-pay | Admitting: Family Medicine

## 2008-12-09 ENCOUNTER — Telehealth: Payer: Self-pay | Admitting: Family Medicine

## 2009-01-02 ENCOUNTER — Ambulatory Visit: Payer: Self-pay | Admitting: Physician Assistant

## 2009-01-06 ENCOUNTER — Ambulatory Visit: Payer: Self-pay | Admitting: Family Medicine

## 2009-01-06 DIAGNOSIS — M722 Plantar fascial fibromatosis: Secondary | ICD-10-CM | POA: Insufficient documentation

## 2009-01-14 ENCOUNTER — Ambulatory Visit: Payer: Self-pay | Admitting: Pain Medicine

## 2009-02-05 ENCOUNTER — Ambulatory Visit: Payer: Self-pay | Admitting: Physician Assistant

## 2009-02-19 ENCOUNTER — Ambulatory Visit: Payer: Self-pay | Admitting: Family Medicine

## 2009-02-21 ENCOUNTER — Telehealth: Payer: Self-pay | Admitting: Family Medicine

## 2009-03-21 ENCOUNTER — Telehealth (INDEPENDENT_AMBULATORY_CARE_PROVIDER_SITE_OTHER): Payer: Self-pay | Admitting: *Deleted

## 2009-05-01 ENCOUNTER — Ambulatory Visit: Payer: Self-pay | Admitting: Physician Assistant

## 2009-05-13 ENCOUNTER — Ambulatory Visit: Payer: Self-pay | Admitting: Family Medicine

## 2009-07-21 LAB — HM MAMMOGRAPHY: HM Mammogram: NORMAL

## 2009-07-21 LAB — CONVERTED CEMR LAB: Pap Smear: NORMAL

## 2009-08-04 ENCOUNTER — Ambulatory Visit: Payer: Self-pay | Admitting: Family Medicine

## 2009-08-05 ENCOUNTER — Ambulatory Visit: Payer: Self-pay | Admitting: Physician Assistant

## 2009-10-24 ENCOUNTER — Ambulatory Visit: Payer: Self-pay | Admitting: Family Medicine

## 2009-11-03 ENCOUNTER — Ambulatory Visit: Payer: Self-pay | Admitting: Pain Medicine

## 2009-11-03 ENCOUNTER — Encounter: Payer: Self-pay | Admitting: Family Medicine

## 2009-11-12 ENCOUNTER — Ambulatory Visit: Payer: Self-pay | Admitting: Family Medicine

## 2009-11-13 LAB — CONVERTED CEMR LAB
ALT: 18 U/L
AST: 19 U/L
Albumin: 3.9 g/dL
Alkaline Phosphatase: 104 U/L
BUN: 8 mg/dL
Basophils Absolute: 0 K/uL
Basophils Relative: 0.2 %
Bilirubin Urine: NEGATIVE
Bilirubin, Direct: 0.1 mg/dL
CO2: 26 meq/L
Calcium: 9.3 mg/dL
Chloride: 109 meq/L
Cholesterol: 168 mg/dL
Creatinine, Ser: 0.9 mg/dL
Eosinophils Absolute: 0.1 K/uL
Eosinophils Relative: 1 %
GFR calc non Af Amer: 71.44 mL/min
Glucose, Bld: 106 mg/dL — ABNORMAL HIGH
Glucose, Urine, Semiquant: NEGATIVE
HCT: 40.6 %
HDL: 38.9 mg/dL — ABNORMAL LOW
Hemoglobin: 13.5 g/dL
Ketones, urine, test strip: NEGATIVE
LDL Cholesterol: 108 mg/dL — ABNORMAL HIGH
Lymphocytes Relative: 34.1 %
Lymphs Abs: 2.7 K/uL
MCHC: 33.1 g/dL
MCV: 91.5 fL
Monocytes Absolute: 0.7 K/uL
Monocytes Relative: 9.1 %
Neutro Abs: 4.4 K/uL
Neutrophils Relative %: 55.6 %
Nitrite: NEGATIVE
Platelets: 256 K/uL
Potassium: 4.6 meq/L
Protein, U semiquant: NEGATIVE
RBC: 4.44 M/uL
RDW: 12.1 %
Sodium: 142 meq/L
Specific Gravity, Urine: 1.015
TSH: 2.81 u[IU]/mL
Total Bilirubin: 0.5 mg/dL
Total CHOL/HDL Ratio: 4
Total Protein: 6.7 g/dL
Triglycerides: 108 mg/dL
Urobilinogen, UA: 0.2
VLDL: 21.6 mg/dL
WBC: 7.9 10*3/microliter
pH: 5

## 2009-11-19 ENCOUNTER — Ambulatory Visit: Payer: Self-pay | Admitting: Family Medicine

## 2009-12-26 ENCOUNTER — Telehealth: Payer: Self-pay | Admitting: Family Medicine

## 2010-01-14 ENCOUNTER — Ambulatory Visit: Payer: Self-pay | Admitting: Family Medicine

## 2010-04-06 ENCOUNTER — Telehealth: Payer: Self-pay | Admitting: Family Medicine

## 2010-04-07 ENCOUNTER — Ambulatory Visit: Payer: Self-pay | Admitting: Family Medicine

## 2010-04-29 ENCOUNTER — Ambulatory Visit: Payer: Self-pay | Admitting: Pain Medicine

## 2010-06-17 ENCOUNTER — Telehealth: Payer: Self-pay | Admitting: Family Medicine

## 2010-06-26 ENCOUNTER — Telehealth: Payer: Self-pay | Admitting: Family Medicine

## 2010-06-29 ENCOUNTER — Ambulatory Visit: Payer: Self-pay | Admitting: Family Medicine

## 2010-08-20 ENCOUNTER — Ambulatory Visit: Payer: Self-pay | Admitting: Pain Medicine

## 2010-09-18 ENCOUNTER — Ambulatory Visit: Payer: Self-pay | Admitting: Family Medicine

## 2010-09-18 ENCOUNTER — Telehealth: Payer: Self-pay | Admitting: Family Medicine

## 2010-10-10 ENCOUNTER — Encounter: Payer: Self-pay | Admitting: Family Medicine

## 2010-10-20 NOTE — Assessment & Plan Note (Signed)
Summary: DEPO PROVERA INJ//CCM  Nurse Visit   Allergies: 1)  ! Aspirin 2)  ! Cipro 3)  ! * Keppra 4)  ! Neurontin 5)  ! * Ultracet 6)  ! * Medical Tape  Medication Administration  Injection # 1:    Medication: Depo-Provera 150mg     Diagnosis: CONTRACEPTIVE MANAGEMENT (ICD-V25.09)    Route: IM    Site: RUOQ gluteus    Exp Date: 02/14    Lot #: [id number]    Mfr: [email or web address]    Patient tolerated injection without complications    Given by: Raechel Ache, RN (April 07, 2010 1:44 PM)  Orders Added: 1)  Depo-Provera 150mg  [J1055] 2)  Admin of Therapeutic Inj  intramuscular or subcutaneous [[id number]]

## 2010-10-20 NOTE — Progress Notes (Signed)
Summary: refill  Phone Note Refill Request Call back at Home Phone [phone] Message from:  Patient---live call  Refills Requested: Medication #1:  KLOR-CON M20 20 MEQ TBCR 1 by mouth two times a day send to [facility] pharmacy  Initial call taken by: Warnell Forester,  June 17, 2010 1:23 PM  Follow-up for Phone Call        done pt aware. Follow-up by: Pura Spice, RN,  June 17, 2010 2:25 PM    New/Updated Medications: KLOR-CON M20 20 MEQ TBCR (POTASSIUM CHLORIDE CRYS CR) 1 by mouth two times a day Prescriptions: KLOR-CON M20 20 MEQ TBCR (POTASSIUM CHLORIDE CRYS CR) 1 by mouth two times a day  #60 x 6   Entered by:   Pura Spice, RN   Authorized by:   Nelwyn Salisbury MD   Signed by:   Pura Spice, RN on 06/17/2010   Method used:   Electronically to        [facility]* (retail)       [address]       [place], [place]  [address]       Ph: [phone]       Fax: [phone]   RxID:   [id number]

## 2010-10-20 NOTE — Letter (Signed)
Summary: [facility]  [facility]   Imported By: Maryln Gottron 11/06/2009 15:05:28  _____________________________________________________________________  External Attachment:    Type:   Image     Comment:   External Document

## 2010-10-20 NOTE — Progress Notes (Signed)
Summary: adjustment to meds for 15mg  bid  Phone Note Call from Patient Call back at Home Phone [phone]   Caller: Patient Call For: Nelwyn Salisbury MD Summary of Call: pt stated pharmacist at [facility]  will no longer be able to get opane er 15 mg because manufactor will no longer make 15 mg however they will still make 5 and 10 mg. Pt will be able to get med this month only. please advise for month of may 2011  Initial call taken by: Heron Sabins,  December 26, 2009 11:58 AM  Follow-up for Phone Call        for next month i will plan on writing for one 5mg  and one 10mg  two times a day for a total of 15mg  two times a day  Follow-up by: Nelwyn Salisbury MD,  December 26, 2009 3:53 PM  Additional Follow-up for Phone Call Additional follow up Details #1::        attempt to call - ans mach - LMTCB if questions - Dr. Clent Ridges will be making adjustments to see that she gets 15 mg two times a day. KIK Additional Follow-up by: Duard Brady LPN,  December 26, 2009 5:08 PM

## 2010-10-20 NOTE — Assessment & Plan Note (Signed)
Summary: DEPO SHOT//CCM  Nurse Visit   Vital Signs:  Patient profile:   50 year old female BP sitting:   110 / 90  (left arm) Cuff size:   large  Vitals Entered By: Raechel Ache, RN (January 14, 2010 2:07 PM)   Allergies: 1)  ! Aspirin 2)  ! Cipro 3)  ! * Keppra 4)  ! Neurontin 5)  ! * Ultracet 6)  ! * Medical Tape  Medication Administration  Injection # 1:    Medication: Depo-Provera 150mg     Diagnosis: CONTRACEPTIVE MANAGEMENT (ICD-V25.09)    Route: IM    Site: LUOQ gluteus    Exp Date: 11/13    Lot #: [id number]    Mfr: [email or web address]    Patient tolerated injection without complications    Given by: Raechel Ache, RN (January 14, 2010 2:10 PM)  Orders Added: 1)  Depo-Provera 150mg  [J1055] 2)  Admin of Therapeutic Inj  intramuscular or subcutaneous [[id number]] Prescriptions: HYDROCODONE-ACETAMINOPHEN 10-325 MG  TABS (HYDROCODONE-ACETAMINOPHEN) three times a day as needed pain  #90 x 5   Entered and Authorized by:   Nelwyn Salisbury MD   Signed by:   Nelwyn Salisbury MD on 01/14/2010   Method used:   Print then Give to Patient   RxID:   [id number] OPANA ER 5 MG XR12H-TAB (OXYMORPHONE HCL) take 3 tabs two times a day, may fill on 03-16-10  #180 x 0   Entered and Authorized by:   Nelwyn Salisbury MD   Signed by:   Nelwyn Salisbury MD on 01/14/2010   Method used:   Print then Give to Patient   RxID:   [id number] OPANA ER 5 MG XR12H-TAB (OXYMORPHONE HCL) take 3 tabs two times a day, may fill on 02-13-10  #180 x 0   Entered and Authorized by:   Nelwyn Salisbury MD   Signed by:   Nelwyn Salisbury MD on 01/14/2010   Method used:   Print then Give to Patient   RxID:   [id number] OPANA ER 5 MG XR12H-TAB (OXYMORPHONE HCL) take 3 tabs two times a day  #180 x 0   Entered and Authorized by:   Nelwyn Salisbury MD   Signed by:   Nelwyn Salisbury MD on 01/14/2010   Method used:   Print then Give to Patient   RxID:   [id number]

## 2010-10-20 NOTE — Assessment & Plan Note (Signed)
Summary: cpx/cjr   Vital Signs:  Patient profile:   50 year old female Height:      62.5 inches Weight:      199 pounds BMI:     35.95 Temp:     98.3 degrees F oral Pulse rate:   99 / minute BP sitting:   124 / 84  (left arm) Cuff size:   large  Vitals Entered By: Alfred Levins, CMA (November 19, 2009 2:41 PM) CC: cpx, no pap, look at mole and red spot on bottom   History of Present Illness: 50 yr old female for cpx. She feels well in general and has no current concerns. Her chronic low back pain and fibromyalgia pains are stable. She has been on her current medication regimen for several years now with no changes. She sees Dr. Laban Emperor for her chronic pain management, but he has recently asked [place] to take over writing her prescriptions for pain meds. He stated to me in a letter dated 11-03-09 that his office was short staffed and that he would no longer be able to see her for routine med refills. He will still be available for injections or other interventional treatments. [name] asks me today if I would be willing to take this over.   Current Medications (verified): 1)  Depo-Provera 150 Mg/ml Im Susp (Medroxyprogesterone Acetate) .... Every 3 Mths 2)  Klor-Con M20 20 Meq Tbcr (Potassium Chloride Crys Cr) .Marland Kitchen.. 1 By Mouth Two Times A Day 3)  Opana Er 10 Mg  Tb12 (Oxymorphone Hcl) .Marland Kitchen.. 1 Every 12 Hrs 4)  Hydrocodone-Acetaminophen 10-325 Mg  Tabs (Hydrocodone-Acetaminophen) .... Qid Prn 5)  Voltaren 1 %  Gel (Diclofenac Sodium) .... Qid 6)  Alprazolam 2 Mg  Tb24 (Alprazolam) .Marland Kitchen.. 1 Three Times A Day 7)  Albuterol Sulfate 2 Mg Tabs (Albuterol Sulfate) .... As Needed 8)  Trazodone Hcl 100 Mg Tabs (Trazodone Hcl) .... As Needed 9)  Bupropion Hcl 150 Mg  Tb12 (Bupropion Hcl) .Marland Kitchen.. 1 Every Morning 10)  Omeprazole 20 Mg Cpdr (Omeprazole) .... One By Mouth Daily 11)  Risperdal 2 Mg Tabs (Risperidone) .... Once Daily 12)  Acyclovir 400 Mg  Tabs (Acyclovir) .Marland Kitchen.. 1 Once Daily 13)  Reglan 5 Mg  Tabs  (Metoclopramide Hcl) .... As Needed 14)  Lidoderm 5 % Ptch (Lidocaine) .... As Needed 15)  Lomotil 2.5-0.025 Mg Tabs (Diphenoxylate-Atropine) .... As Needed 16)  Gas-X Extra Strength 125 Mg  Caps (Simethicone) .... As Needed 17)  Calcium Citrate 250 Mg  Tabs (Calcium Citrate) .... Two Times A Day 18)  Glucosamine 1500 Complex   Caps (Glucosamine-Chondroit-Vit C-Mn) .Marland Kitchen.. 1 Once Daily 19)  Multivitamins   Caps (Multiple Vitamin) .Marland Kitchen.. 1 By Mouth Once Daily 20)  Oxybutynin Chloride 5 Mg  Tabs (Oxybutynin Chloride) .Marland Kitchen.. 1 By Mouth Two Times A Day 21)  Norflex 100mg  .... Two Times A Day 22)  Sumatriptan Succinate 100 Mg Tabs (Sumatriptan Succinate) .... As Needed 23)  Sulfacetamide Sodium-Sulfur 10-5 % Susp (Sulfacetamide Sodium-Sulfur) .... 2 Drops in Eye 4 Times A Day 24)  Imitrex 20 Mg/act Soln (Sumatriptan) .Marland Kitchen.. 1 Spray As Needed 25)  Macrobid 100 Mg Caps (Nitrofurantoin Monohyd Macro) .Marland Kitchen.. 1 By Mouth Two Times A Day  Allergies (verified): 1)  ! Aspirin 2)  ! Cipro 3)  ! * Keppra 4)  ! Neurontin 5)  ! * Ultracet 6)  ! * Medical Tape  Past History:  Past Medical History: Asthma Depression Headache Allergic rhinitis GERD and IBS per  Dr. Ewing Schlein reflex sympathetic dystrophy left hand, per Dr. Amanda Pea and Dr. Ethelene Hal bipolar disorder, per Dr. Orpha Bur in [place] sees Dr. Chevis Pretty for GYN exams fibromyalgia, sees Dr. Delano Metz  in [place]  Low back pain  Past Surgical History: Cholecystectomy Repair left-sided shoulder separation Left knee sugery Sinus surgery received a total of 8 radiofrequency ablations in April 2010 to the lower spine per Dr. Laban Emperor  Family History: Reviewed history from 08/30/2007 and no changes required. Family History Hypertension  Social History: Reviewed history from 08/30/2007 and no changes required. Married Never Smoked Alcohol use-yes disabled  Review of Systems  The patient denies anorexia, fever, weight loss, weight  gain, vision loss, decreased hearing, hoarseness, chest pain, syncope, dyspnea on exertion, peripheral edema, prolonged cough, headaches, hemoptysis, abdominal pain, melena, hematochezia, severe indigestion/heartburn, hematuria, incontinence, genital sores, muscle weakness, suspicious skin lesions, transient blindness, difficulty walking, depression, unusual weight change, abnormal bleeding, enlarged lymph nodes, angioedema, breast masses, and testicular masses.    Physical Exam  General:  overweight-appearing.   Head:  Normocephalic and atraumatic without obvious abnormalities. No apparent alopecia or balding. Eyes:  No corneal or conjunctival inflammation noted. EOMI. Perrla. Funduscopic exam benign, without hemorrhages, exudates or papilledema. Vision grossly normal. Ears:  External ear exam shows no significant lesions or deformities.  Otoscopic examination reveals clear canals, tympanic membranes are intact bilaterally without bulging, retraction, inflammation or discharge. Hearing is grossly normal bilaterally. Nose:  External nasal examination shows no deformity or inflammation. Nasal mucosa are pink and moist without lesions or exudates. Mouth:  Oral mucosa and oropharynx without lesions or exudates.  Teeth in good repair. Neck:  No deformities, masses, or tenderness noted. Chest Wall:  No deformities, masses, or tenderness noted. Lungs:  Normal respiratory effort, chest expands symmetrically. Lungs are clear to auscultation, no crackles or wheezes. Heart:  Normal rate and regular rhythm. S1 and S2 normal without gallop, murmur, click, rub or other extra sounds. Abdomen:  Bowel sounds positive,abdomen soft and non-tender without masses, organomegaly or hernias noted. Msk:  No deformity or scoliosis noted of thoracic or lumbar spine.   Pulses:  R and L carotid,radial,femoral,dorsalis pedis and posterior tibial pulses are full and equal bilaterally Extremities:  No clubbing, cyanosis, edema,  or deformity noted with normal full range of motion of all joints.   Neurologic:  No cranial nerve deficits noted. Station and gait are normal. Plantar reflexes are down-going bilaterally. DTRs are symmetrical throughout. Sensory, motor and coordinative functions appear intact. Skin:  Intact without suspicious lesions or rashes Cervical Nodes:  No lymphadenopathy noted Axillary Nodes:  No palpable lymphadenopathy Inguinal Nodes:  No significant adenopathy Psych:  Cognition and judgment appear intact. Alert and cooperative with normal attention span and concentration. No apparent delusions, illusions, hallucinations   Impression & Recommendations:  Problem # 1:  WELL ADULT EXAM (ICD-V70.0)  Complete Medication List: 1)  Depo-provera 150 Mg/ml Im Susp (Medroxyprogesterone acetate) .... Every 3 mths 2)  Klor-con M20 20 Meq Tbcr (Potassium chloride crys cr) .Marland Kitchen.. 1 by mouth two times a day 3)  Opana Er 10 Mg Tb12 (Oxymorphone hcl) .Marland Kitchen.. 1 every 12 hrs 4)  Hydrocodone-acetaminophen 10-325 Mg Tabs (Hydrocodone-acetaminophen) .... Qid prn 5)  Voltaren 1 % Gel (Diclofenac sodium) .... Qid 6)  Alprazolam 2 Mg Tb24 (Alprazolam) .Marland Kitchen.. 1 three times a day 7)  Albuterol Sulfate 2 Mg Tabs (Albuterol sulfate) .... As needed 8)  Trazodone Hcl 100 Mg Tabs (Trazodone hcl) .... As needed 9)  Bupropion Hcl 150  Mg Tb12 (Bupropion hcl) .Marland Kitchen.. 1 every morning 10)  Omeprazole 20 Mg Cpdr (Omeprazole) .... One by mouth daily 11)  Risperdal 2 Mg Tabs (Risperidone) .... Once daily 12)  Acyclovir 400 Mg Tabs (Acyclovir) .Marland Kitchen.. 1 once daily 13)  Reglan 5 Mg Tabs (Metoclopramide hcl) .... As needed 14)  Lomotil 2.5-0.025 Mg Tabs (Diphenoxylate-atropine) .... As needed 15)  Gas-x Extra Strength 125 Mg Caps (Simethicone) .... As needed 16)  Calcium Citrate 250 Mg Tabs (Calcium citrate) .... Two times a day 17)  Glucosamine 1500 Complex Caps (Glucosamine-chondroit-vit c-mn) .Marland Kitchen.. 1 once daily 18)  Multivitamins Caps (Multiple  vitamin) .Marland Kitchen.. 1 by mouth once daily 19)  Oxybutynin Chloride 5 Mg Tabs (Oxybutynin chloride) .Marland Kitchen.. 1 by mouth two times a day 20)  Norflex 100mg   .... Two times a day 21)  Sumatriptan Succinate 100 Mg Tabs (Sumatriptan succinate) .... As needed 22)  Imitrex 20 Mg/act Soln (Sumatriptan) .Marland Kitchen.. 1 spray as needed 23)  Macrobid 100 Mg Caps (Nitrofurantoin monohyd macro) .Marland Kitchen.. 1 by mouth two times a day  Patient Instructions: 1)  It is important that you exercise reguarly at least 20 minutes 5 times a week. If you develop chest pain, have severe difficulty breathing, or feel very tired, stop exercising immediately and seek medical attention.  2)  You need to lose weight. Consider a lower calorie diet and regular exercise.  3)  I agreed to take over writing for her pain meds. She has enough to last her for the next 2 months.  4)  Please schedule a follow-up appointment in 2 months.   Preventive Care Screening  Mammogram:    Date:  07/21/2009    Results:  normal   Pap Smear:    Date:  07/21/2009    Results:  normal

## 2010-10-20 NOTE — Assessment & Plan Note (Signed)
Summary: DEPO INJ//SLM  Nurse Visit   Vital Signs:  Patient profile:   50 year old female Weight:      194 pounds BP standing:   140 / 90  (left arm) Cuff size:   regular  Vitals Entered By: Alfred Levins, CMA (October 24, 2009 2:04 PM)   Allergies: 1)  ! Aspirin 2)  ! Cipro 3)  ! * Keppra 4)  ! Neurontin 5)  ! * Ultracet 6)  ! * Medical Tape  Medication Administration  Injection # 1:    Medication: Depo-Provera 150mg     Diagnosis: CONTRACEPTIVE MANAGEMENT (ICD-V25.09)    Route: IM    Site: RUOQ gluteus    Exp Date: 4/13    Lot #: [id number]    Mfr: Francisca December    Patient tolerated injection without complications    Given by: Alfred Levins, CMA (October 24, 2009 2:05 PM)  Orders Added: 1)  Depo-Provera 150mg  [J1055] 2)  Admin of Therapeutic Inj  intramuscular or subcutaneous [[id number]]

## 2010-10-20 NOTE — Progress Notes (Signed)
Summary: 3 month rx  Phone Note Call from Patient Call back at Home Phone [phone]   Caller: Patient Call For: Nelwyn Salisbury MD Summary of Call: pt needs 3 month rx for opana 5 mg Initial call taken by: Heron Sabins,  April 06, 2010 11:17 AM  Follow-up for Phone Call        done  Follow-up by: Nelwyn Salisbury MD,  April 06, 2010 5:29 PM    New/Updated Medications: OPANA ER 5 MG XR12H-TAB (OXYMORPHONE HCL) take 3 tabs two times a day OPANA ER 5 MG XR12H-TAB (OXYMORPHONE HCL) take 3 tabs two times a day, may fill on 05-07-10 OPANA ER 5 MG XR12H-TAB (OXYMORPHONE HCL) take 3 tabs two times a day, may fill on 06-07-10 OPANA ER 5 MG XR12H-TAB (OXYMORPHONE HCL) take 3 tabs two times a day OPANA ER 5 MG XR12H-TAB (OXYMORPHONE HCL) take 3 tabs two times a day, may fill on 05-08-10 OPANA ER 5 MG XR12H-TAB (OXYMORPHONE HCL) take 3 tabs two times a day, may fill on 06-08-10 Prescriptions: OPANA ER 5 MG XR12H-TAB (OXYMORPHONE HCL) take 3 tabs two times a day, may fill on 06-08-10  #180 x 0   Entered and Authorized by:   Nelwyn Salisbury MD   Signed by:   Nelwyn Salisbury MD on 04/07/2010   Method used:   Print then Give to Patient   RxID:   [id number] GEXBM ER 5 MG XR12H-TAB (OXYMORPHONE HCL) take 3 tabs two times a day, may fill on 05-08-10  #180 x 0   Entered and Authorized by:   Nelwyn Salisbury MD   Signed by:   Nelwyn Salisbury MD on 04/07/2010   Method used:   Print then Give to Patient   RxID:   [id number] UYQIH ER 5 MG XR12H-TAB (OXYMORPHONE HCL) take 3 tabs two times a day  #180 x 0   Entered and Authorized by:   Nelwyn Salisbury MD   Signed by:   Nelwyn Salisbury MD on 04/07/2010   Method used:   Print then Give to Patient   RxID:   [id number] OPANA ER 5 MG XR12H-TAB (OXYMORPHONE HCL) take 3 tabs two times a day, may fill on 06-07-10  #180 x 0   Entered and Authorized by:   Nelwyn Salisbury MD   Signed by:   Nelwyn Salisbury MD on 04/06/2010   Method used:   Print then Give to  Patient   RxID:   [id number] WFUXN ER 5 MG XR12H-TAB (OXYMORPHONE HCL) take 3 tabs two times a day, may fill on 05-07-10  #180 x 0   Entered and Authorized by:   Nelwyn Salisbury MD   Signed by:   Nelwyn Salisbury MD on 04/06/2010   Method used:   Print then Give to Patient   RxID:   [id number] CBJSE ER 5 MG XR12H-TAB (OXYMORPHONE HCL) take 3 tabs two times a day  #180 x 0   Entered and Authorized by:   Nelwyn Salisbury MD   Signed by:   Nelwyn Salisbury MD on 04/06/2010   Method used:   Print then Give to Patient   RxID:   [id number]

## 2010-10-20 NOTE — Progress Notes (Signed)
Summary: new rx  Phone Note Refill Request Call back at Home Phone [phone] Message from:  Ashley [name]  Refills Requested: Medication #1:  OPANA ER 5 MG XR12H-TAB take 3 tabs two times a day  Medication #2:  HYDROCODONE-ACETAMINOPHEN 10-325 MG  TABS three times a day as needed pain  Medication #3:  VOLTAREN 1 %  GEL qid pt will pick up rxs monday opana must be written for 3 months  Initial call taken by: Heron Sabins,  June 26, 2010 2:42 PM  Follow-up for Phone Call        done Follow-up by: Nelwyn Salisbury MD,  June 26, 2010 4:42 PM  Additional Follow-up for Phone Call Additional follow up Details #1::        pt aware up front at window 4 Additional Follow-up by: Pura Spice, RN,  June 26, 2010 4:46 PM    New/Updated Medications: HYDROCODONE-ACETAMINOPHEN 10-325 MG  TABS (HYDROCODONE-ACETAMINOPHEN) three times a day as needed pain VOLTAREN 1 %  GEL (DICLOFENAC SODIUM) apply 4 times a day OPANA ER 5 MG XR12H-TAB (OXYMORPHONE HCL) take 3 tabs two times a day, may fill on 08-26-10 OPANA ER 5 MG XR12H-TAB (OXYMORPHONE HCL) take 3 tabs two times a day OPANA ER 5 MG XR12H-TAB (OXYMORPHONE HCL) take 3 tabs two times a day, may fill on 07-27-10 Prescriptions: VOLTAREN 1 %  GEL (DICLOFENAC SODIUM) apply 4 times a day  #120 x 5   Entered and Authorized by:   Nelwyn Salisbury MD   Signed by:   Nelwyn Salisbury MD on 06/26/2010   Method used:   Print then Give to Ashley [name]   RxID:   [id number] HYDROCODONE-ACETAMINOPHEN 10-325 MG  TABS (HYDROCODONE-ACETAMINOPHEN) three times a day as needed pain  #90 x 5   Entered and Authorized by:   Nelwyn Salisbury MD   Signed by:   Nelwyn Salisbury MD on 06/26/2010   Method used:   Print then Give to Ashley [name]   RxID:   [id number] OPANA ER 5 MG XR12H-TAB (OXYMORPHONE HCL) take 3 tabs two times a day, may fill on 08-26-10  #180 x 0   Entered and Authorized by:   Nelwyn Salisbury MD   Signed by:   Nelwyn Salisbury MD on 06/26/2010   Method  used:   Print then Give to Ashley [name]   RxID:   [id number] OPANA ER 5 MG XR12H-TAB (OXYMORPHONE HCL) take 3 tabs two times a day, may fill on 07-27-10  #180 x 0   Entered and Authorized by:   Nelwyn Salisbury MD   Signed by:   Nelwyn Salisbury MD on 06/26/2010   Method used:   Print then Give to Ashley [name]   RxID:   [id number] IEPPI ER 5 MG XR12H-TAB (OXYMORPHONE HCL) take 3 tabs two times a day  #180 x 0   Entered and Authorized by:   Nelwyn Salisbury MD   Signed by:   Nelwyn Salisbury MD on 06/26/2010   Method used:   Print then Give to Ashley [name]   RxID:   [id number]

## 2010-10-20 NOTE — Assessment & Plan Note (Signed)
Summary: DEPO SHOT//CCM  rt gluteal   Nurse Visit   Review of Systems       Flu Vaccine Consent Questions     Do you have a history of severe allergic reactions to this vaccine? no    Any prior history of allergic reactions to egg and/or gelatin? no    Do you have a sensitivity to the preservative Thimersol? no    Do you have a past history of Guillan-Barre Syndrome? no    Do you currently have an acute febrile illness? no    Have you ever had a severe reaction to latex? no    Vaccine information given and explained to patient? yes    Are you currently pregnant? no    Lot Number:[id number]   Exp Date:03/20/2011   Site Given  Left Deltoid IM Pura Spice, RN  June 29, 2010 1:50 PM    Allergies: 1)  ! Aspirin 2)  ! Cipro 3)  ! * Keppra 4)  ! Neurontin 5)  ! * Ultracet 6)  ! * Medical Tape  Medication Administration  Injection # 1:    Medication: Depo-Provera 150mg     Diagnosis: CONTRACEPTIVE MANAGEMENT (ICD-V25.09)    Route: IM    Site: RUOQ gluteus    Exp Date: 11/2012    Lot #: [id number]    Mfr: [email or web address]    Comments: due 09-20-2010    Patient tolerated injection without complications    Given by: Pura Spice, RN (June 29, 2010 1:51 PM)  Orders Added: 1)  Admin 1st Vaccine [90471] 2)  Flu Vaccine 91yrs + [27253] 3)  Depo-Provera 150mg  [J1055] 4)  Admin of Therapeutic Inj  intramuscular or subcutaneous [66440]

## 2010-10-22 NOTE — Progress Notes (Signed)
Summary: refills  Phone Note Refill Request Call back at Home Phone [phone] Message from:  Patient--live call  Refills Requested: Medication #1:  OPANA ER 5 MG XR12H-TAB take 3 tabs two times a day  Medication #2:  VOLTAREN 1 %  GEL apply 4 times a day needs 3 rxs...will be in this afternoon for her depo shot. can get rxs then. need more than 1 tube for the gel please. wants generic Norflex as well.   Initial call taken by: Warnell Forester,  September 18, 2010 9:49 AM  Follow-up for Phone Call        pt picked up rxs  Follow-up by: Pura Spice, RN,  September 18, 2010 2:07 PM    New/Updated Medications: OPANA ER 5 MG XR12H-TAB (OXYMORPHONE HCL) take 3 tabs two times a day OPANA ER 5 MG XR12H-TAB (OXYMORPHONE HCL) take 3 tabs two times a day, may fill on 10-19-10 OPANA ER 5 MG XR12H-TAB (OXYMORPHONE HCL) take 3 tabs two times a day, may fill on 11-17-10 Prescriptions: OPANA ER 5 MG XR12H-TAB (OXYMORPHONE HCL) take 3 tabs two times a day, may fill on 11-17-10  #180 x 0   Entered and Authorized by:   Nelwyn Salisbury MD   Signed by:   Nelwyn Salisbury MD on 09/18/2010   Method used:   Print then Give to Patient   RxID:   [id number] OPANA ER 5 MG XR12H-TAB (OXYMORPHONE HCL) take 3 tabs two times a day, may fill on 10-19-10  #180 x 0   Entered and Authorized by:   Nelwyn Salisbury MD   Signed by:   Nelwyn Salisbury MD on 09/18/2010   Method used:   Print then Give to Patient   RxID:   [id number] UXLKG ER 5 MG XR12H-TAB (OXYMORPHONE HCL) take 3 tabs two times a day  #180 x 0   Entered and Authorized by:   Nelwyn Salisbury MD   Signed by:   Nelwyn Salisbury MD on 09/18/2010   Method used:   Print then Give to Patient   RxID:   [id number] NORFLEX 100MG  two times a day  #60 x 11   Entered and Authorized by:   Nelwyn Salisbury MD   Signed by:   Nelwyn Salisbury MD on 09/18/2010   Method used:   Print then Give to Patient   RxID:   [id number]

## 2010-10-22 NOTE — Assessment & Plan Note (Signed)
Summary: depo injection//lch rsc per doc/njr pt rsc/njr left gluteal  Nurse Visit   Allergies: 1)  ! Aspirin 2)  ! Cipro 3)  ! * Keppra 4)  ! Neurontin 5)  ! * Ultracet 6)  ! * Medical Tape  Medication Administration  Injection # 1:    Medication: Depo-Provera 150mg     Diagnosis: CONTRACEPTIVE MANAGEMENT (ICD-V25.09)    Route: IM    Site: LUOQ gluteus    Exp Date: 01/2013    Lot #: [id number]    Mfr: greenstone    Comments: to return December 11 2010 for next inj.     Patient tolerated injection without complications    Given by: Pura Spice, RN (September 18, 2010 1:55 PM)  Orders Added: 1)  Depo-Provera 150mg  [J1055] 2)  Admin of Therapeutic Inj  intramuscular or subcutaneous [16109]

## 2010-11-02 ENCOUNTER — Ambulatory Visit: Payer: Self-pay | Admitting: Pain Medicine

## 2010-12-03 ENCOUNTER — Encounter: Payer: Self-pay | Admitting: Family Medicine

## 2010-12-11 ENCOUNTER — Telehealth: Payer: Self-pay | Admitting: Family Medicine

## 2010-12-11 ENCOUNTER — Ambulatory Visit: Payer: Self-pay | Admitting: Family Medicine

## 2010-12-11 ENCOUNTER — Ambulatory Visit (INDEPENDENT_AMBULATORY_CARE_PROVIDER_SITE_OTHER): Payer: Medicare Other | Admitting: Family Medicine

## 2010-12-11 DIAGNOSIS — Z309 Encounter for contraceptive management, unspecified: Secondary | ICD-10-CM

## 2010-12-11 MED ORDER — HYDROCODONE-ACETAMINOPHEN 10-325 MG PO TABS: 1.0000 | ORAL_TABLET | Freq: Three times a day (TID) | ORAL | Status: DC | PRN

## 2010-12-11 MED ORDER — MEDROXYPROGESTERONE ACETATE 150 MG/ML IM SUSP
150.0000 mg | Freq: Once | INTRAMUSCULAR | Status: AC
  Administered 2010-12-11: 150 mg via INTRAMUSCULAR

## 2010-12-11 MED ORDER — OXYMORPHONE HCL ER 5 MG PO TB12: 5.0000 mg | ORAL_TABLET | Freq: Two times a day (BID) | ORAL | Status: DC

## 2010-12-11 NOTE — Telephone Encounter (Signed)
Refill Norco and Opana. Patient will be here this afternoon for an injection and would like to pick up rxs at her appt .

## 2010-12-11 NOTE — Telephone Encounter (Signed)
done

## 2011-01-27 ENCOUNTER — Other Ambulatory Visit: Payer: Self-pay | Admitting: Family Medicine

## 2011-01-27 NOTE — Telephone Encounter (Signed)
Pt called re: her last Opana ER 5 mg tid twice daily. Pt said that he only filled for #180 and pt said that he normally fills enough to last for 3 month. Pt is req the other part of her refills. Pls call when ready for pick up.

## 2011-01-28 ENCOUNTER — Telehealth: Payer: Self-pay | Admitting: *Deleted

## 2011-01-28 MED ORDER — OXYMORPHONE HCL ER 5 MG PO TB12: 5.0000 mg | ORAL_TABLET | Freq: Two times a day (BID) | ORAL | Status: DC

## 2011-01-28 NOTE — Telephone Encounter (Signed)
LMOM for Pt to inform that Rxs are ready for p/u at office.

## 2011-01-28 NOTE — Telephone Encounter (Signed)
Done for 3 months

## 2011-02-02 NOTE — Op Note (Signed)
NAMEKYMORAH, KORF                    ACCOUNT NO.:  [id number]   MEDICAL RECORD NO.:  [id number]          PATIENT TYPE:  INP   LOCATION:  0098                         FACILITY:  [facility]   PHYSICIAN:  Juanetta Gosling, MDDATE OF BIRTH:  03-30-1961   DATE OF PROCEDURE:  05/30/2008  DATE OF DISCHARGE:                               OPERATIVE REPORT   PREOPERATIVE DIAGNOSIS:  Acute appendicitis.   POSTOPERATIVE DIAGNOSIS:  Acute suppurative appendicitis.   PROCEDURE:  Laparoscopic appendectomy.   SURGEON:  Juanetta Gosling, MD.   ASSISTANT:  None.   ANESTHESIA:  General endotracheal.   FINDINGS:  Acute suppurative appendicitis.   SPECIMENS:  Appendix to pathology.   ESTIMATED BLOOD LOSS:  Minimal.   COMPLICATIONS:  None.   DRAINS:  None.   DISPOSITION:  To PACU in stable condition.   HISTORY OF PRESENT ILLNESS:  Mrs. Greiner is a 50 year old female with  about a 24-hour history of abdominal pain that has localized mostly to  her right lower quadrant.  She was afebrile, was not hungry, had a  mildly elevated white count and an exam consistent with acute  appendicitis.  I counseled her for a laparoscopic or possible open  appendectomy.   PROCEDURE:  After informed consent was obtained, the patient was  administered 1 gram of Invanz  via the emergency room.  She was then  taken to the operating room, where she was placed under general  endotracheal anesthesia without complications.  Sequential monitoring  and devices were on her lower extremities throughout the operation.  Her  abdomen was then prepped and draped in standard sterile surgical  fashion.  After the insertion of a Foley catheter, a surgical time-out  was then performed.  A 10-mm vertical incision was then made below her  belly button.  The incision was carried out down to the level of her  fascia, which was entered sharply.  The peritoneum was then entered  bluntly with a Kelly clamp.  A Vicryl pursestring  suture was then placed  on and around at the defect.  Hasson trocar was then inserted and the  abdomen was insufflated to 15 mmHg pressure.  A further 5-mm trocar was  placed in the right upper quadrant and a 5-mm suprapubic trocar was also  placed, after infiltration with local anesthetic.  The appendix was  noted to be in the retrocecal position and curving upwards.  The white  line of Toldt was taken down with a harmonic scalpel for several  centimeters in able to roll the cecum more medial.  The appendix was  noted to be acutely suppurative.  The base was clearly identified.  There were multiple attachments of the appendix to the sidewall as well  as to the cecum, which were taken down with a combination of blunt  dissection and a harmonic scalpel.  Once the appendix was freed, the  base was then dissected free from the mesentery.  A 5-mm scope was then  placed in the right upper quadrant and a blue load on the stapler was  then placed  across the appendix.  This was then fired and with good  position of all the staples.  The appendix was only attached by the  mesentery, which was noted to be thickened.  I then inserted a vascular  load in the stapler and then it took 2 fires to come across the  mesentery.  This was hemostatic following this.  A portion of the area  that I had taken down for the white line of Toldt had some hematoma  associated with this.  Irrigation was performed.  The staple line for  the appendix was clean.  The staple line for the mesentery was also  clean without any evidence of bleeding.  I then inspected the area that  I had taken down to the white line of Toldt, and this removed all the  hematoma.  There was no evidence of any active bleeding in this area.  Irrigation was performed; this was clear.  I then removed the trocars,  desufflated the abdomen.  I tied the pursestring suture.  With no  evidence of any further defect, the wounds were all closed with a  4-0  Monocryl in subcuticular fashion.  Dermabond was placed over them.  Her  Foley catheter was removed.  She was extubated in the operating room and  transferred to the PACU in stable condition.      Juanetta Gosling, MD  Electronically Signed     MCW/MEDQ  D:  05/30/2008  T:  05/30/2008  Job:  [id number]

## 2011-02-02 NOTE — H&P (Signed)
NAME:  [name], [name]                    ACCOUNT NO.:  [id number]   MEDICAL RECORD NO.:  [id number]          PATIENT TYPE:  INP   LOCATION:  1620                         [facility]ACILITY:  [facility]   PHYSICIAN:  Juanetta Gosling, MDDATE OF BIRTH:  22-May-1961   DATE OF ADMISSION:  05/29/2008  DATE OF DISCHARGE:                              HISTORY & PHYSICAL   ER consult from Rozell Searing, PA   CHIEF COMPLAINT AND HISTORY OF THE PRESENT ILLNESS:  The patient  presents with a chief complaint of abdominal pain.  Ashley [name] is a 50 year old  female with about 24 hours of abdominal pain that has now localized  primarily to her right lower quadrant.  Ashley [name] denies any fevers; however,  Ashley [name] has been nauseated during this period as well.  Ashley [name] thought Ashley [name] had  a kidney stone and saw her primary care physician this afternoon where  he performed a urinalysis, which was negative.  He was concerned for an  appendicitis and then sent her to the emergency room at [facility].  Upon arrival here Ashley [name] had some labs drawn with an elevated white count  and a CT scan consistent with an acute appendicitis.   PAST MEDICAL HISTORY:  The past medical history is significant for:  1. Irritable bowel syndrome.  2. Chronic back pain.  3. Bipolar disorder.  4. Gastroesophageal reflux disease.  5. Reactive airway disease.   PAST SURGICAL HISTORY:  The patient's past surgical history includes:  1. Laparoscopic cholecystectomy.  2. Cervical fusion.  3. Bilateral carpal tunnel.  4. Surgery on the left shoulder x2.   SOCIAL HISTORY:  The social history include occasional alcohol.  Ashley [name] is  a nonsmoker.   ALLERGIES:  Drug allergies include ASPIRIN, CIPRO, KEPPRA, NEURONTIN,  SURGICAL TAPE, and ULTRACET.   MEDICATIONS:  The patient's medications are:  1. Trazodone 50 at bedtime.  2. Xanax 2 mg three times a day.  3. Seroquel 25/50 every day.  4. Potassium 20 mEq every day.  5. Wellbutrin 150 mg every morning.  6. Narco as  needed for pain.  7. Voltaren gel.  8. Reglan 10 mg three times a day.  9. Protonix 40 mg every day.   REVIEW OF SYSTEMS:  The review of systems is pertinent for her multiple  pain issues.   PHYSICAL EXAMINATION:  VITAL SIGNS:  Physical exam shows a temperature  of 98.1, pulse of 96, respirations of 20 and blood pressure 142/89.  GENERAL APPEARANCE:  On exam this is a well-appearing female in no  distress.  NECK:  The patient's neck is supple without adenopathy.  CHEST:  The chest is clear bilaterally.  HEART:  The heart has a regular rate and rhythm.  ABDOMEN:  The abdomen is soft with a well-healed laparoscopic scar.  Ashley [name]  has a tender right lower quadrant and right upper quadrant with local  peritoneal signs in the right lower quadrant.  EXTREMITIES:  The extremities are all nontender.  NEUROLOGIC EXAMINATION:  Neurologically Ashley [name] moves all her extremities;  and, this is grossly normal.   LABORATORY DATA:  The patient's laboratory exam shows an HCG that is  negative and a white blood cell count of 11.3.  Her x-ray examinations  showed a CT scan consistent with an acute appendicitis.   IMPRESSION:  Acute appendicitis.   PLAN:  The plan of action is a laparoscopic, possible open,  appendectomy.  We discussed this procedure as the treatment for her  disease.  Risks including, but not limited to bleeding, infection,  abscess and/or further procedures for infection, and then an open  procedure.  Ashley [name], her husband and her mother were present for this  conversation; and, all agreed to the planned procedure.  Ashley [name] has been  admitted  __________  the emergency room, and we will proceed tonight  with the appendectomy.      Juanetta Gosling, MD  Electronically Signed     MCW/MEDQ  D:  05/30/2008  T:  05/30/2008  Job:  [id number]

## 2011-02-05 NOTE — Assessment & Plan Note (Signed)
[facility] OFFICE NOTE   NAME:Ashley [name]                         MRN:          [id number]  DATE:07/19/2006                            DOB:          May 24, 1961    This is a 50 year old woman here for a nongynecological physical  examination. In general, she is doing well. As far as issues that we have  been working on, her depression is stable, her asthma is stable, she  continues to see Dr. Ethelene Hal for fibromyalgia and chronic back pain. She has  been under a lot of stress lately however fighting with lawyers and with  [employer]ers [employer] people to try to get her benefits paid as she thinks  she deserves. She was recently taken off of OxyContin and put back on  Vicodin. This has caused her to experience a good deal more myalgia-type  pains than she had been experiencing before. She recently had surgery to  release a tendon in her right thumb per Dr. Merlyn Lot. She continues to see Dr.  Chevis Pretty for gynecology exams. Further details of her past medical history,  family history, social history, habits, etc. per her last physical noted  dated July 09, 2005.   ALLERGIES:  ASPIRIN, ULTRACET, LYRICA, BIAXIN and CIPRO.   CURRENT MEDICATIONS:  1. Depo-Provera shots every 3 months.  2. K-Dur 20 mEq once a day.  3. Seroquel 100 mg q.h.s.  4. Wellbutrin 150 mg q.h.s.  5. Protonix 40 mg per day.  6. Pulmicort inhaler 1 puff per day.  7. Trazodone 25 mg q.h.s.  8. Valtrex 500 mg 1 every 3 days.  9. Imitrex 20 mg nasal spray as needed for migraine headaches.  10.Zanaflex 4 mg 2 capsules t.i.d.  11.Vicodin 10/325 up to 6 times a day as needed for pain.  12.Albuterol inhaler as needed.  13.Xanax 2 mg t.i.d. as needed.   OBJECTIVE:  VITAL SIGNS:  Height 5 foot 3 inches, weight 172, BP 142/98,  pulse 96 and regular.  GENERAL:  She remains obese. She is fairly anxious today.  HEENT:  Eyes clear, ears clear, oropharynx  clear.  NECK:  Supple without lymphadenopathy or masses.  LUNGS:  Clear.  CARDIAC:  Rate and rhythm regular without gallops, murmurs or rubs. Distal  pulses are full.  ABDOMEN:  Soft, normal bowel sounds, nontender, no masses.  EXTREMITIES:  No clubbing, cyanosis or edema.  NEUROLOGIC:  Grossly intact.   She was here for fasting labs on October 22. These were all in fairly good  shape. Her HDL remains low at 31 but has come up a lot in the past year. Her  LDL remains a little high at 111.   ASSESSMENT/PLAN:  1. Complete physical. We talked about increasing exercise and decreasing      weight as much as she is able.  2. Back pain and fibromyalgia. She will followup with Dr. Ethelene Hal.  3. Migraine headache, stable.  4. Asthma, stable.  5. Elevated blood pressure. Will continue to watch this closely and begin  treatment if it stays elevated.  6. Depression, stable.  7. She was given a flu shot today.    ______________________________  Tera Mater. Clent Ridges, MD    SAF/MedQ  DD: 07/19/2006  DT: 07/20/2006  Job #: [id number]

## 2011-02-05 NOTE — Op Note (Signed)
[place]  Patient:    Ashley [name]                  MRN: [id number] Proc. Date: 08/31/99 Adm. Date:  [id number] Attending:  Alinda Deem                           Operative Report  PREOPERATIVE DIAGNOSIS:  Chondromalacia patella of the left knee.  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  PROCEDURE:  Left knee arthroscopic removal of chondromalacia patella and suprapatellar fibrous band.  SURGEON:  Alinda Deem, M.D.  ASSISTANT:  Dorthula Matas, P.A.-C.  ANESTHESIA:  General endotracheal.  ESTIMATED BLOOD LOSS:  Minimal.  FLUID REPLACEMENT:  700 cc of crystalloid.  DRAINS:  None.  TOURNIQUET TIME:  None.  INDICATIONS:  A 50 year old woman followed for what clinically is patellofemoral joint syndrome of the left knee for the last few years, who has  failed conservative treatment with anti-inflammatory medicines, straight leg raising exercises and did get temporary relief from an intraarticular cortisone injection. Because of persistent pain, despite a normal MRI scan, arthroscopic evaluation f the left knee was desired and consented to by the patient.  Risks and benefits f surgery understood by the patient.  DESCRIPTION OF PROCEDURE:  The patient identified by armband, taken to the operating room at [facility].  Appropriate anesthetic monitors were attached and general endotracheal anesthesia induced with the patient in the supine position.  The left lower extremity was then prepped and draped in usual sterile fashion from the ankle to the mid thigh and a lateral post applied to the table. Standard inferomedial and inferolateral peripatellar portals were then made with a #11 blade allowing introduction of the arthroscope into the lateral portal and he outflow through the inferomedial portal.  Some chondromalacia with small  flap tearing of the lateral facet of the patella was identified and debrided with a 4.2 mm [employer], as well as, a suprapatellar fibrous band, which is also removed with the sucker/shaver.  The plica did not appear to be inflamed and was only incidentally trimmed.  The articular and meniscal cartilage of the  medial and lateral compartment were pristine and no debridement was undertaken.  The ACL and the PCL were intact as well.  The gutters were cleared, the knee was washed out with normal saline solution.  The arthroscopic instruments removed and a dressing of Xeroform, 4 x 4 dressing sponges, Webril and Ace wrap applied.  The  patient was then awakened and taken to recovery room without difficulty. DD:  08/31/99 TD:  08/31/99 Job: [id number]

## 2011-02-05 NOTE — Op Note (Signed)
[place]  Patient:    Ashley [name], Ashley [name]                   MRN: [id number] Proc. Date: 02/10/01 Adm. Date:  [id number] Attending:  Fortino Sic CC:         Petra Kuba, M.D.   Operative Report  PREOPERATIVE DIAGNOSIS:  Probable biliary dyskinesia.  POSTOPERATIVE DIAGNOSIS:  Cholesterolosis of the gallbladder.  PROCEDURE:  Laparoscopic cholecystectomy with intraoperative cholangiogram.  SURGEON:  Marnee Spring. Wiliam Ke, M.D.  ASSISTANT:  Mardene Celeste. Lurene Shadow, M.D.  ANESTHESIA:  Endotracheal by hospital.  DESCRIPTION OF PROCEDURE:  Under good endotracheal anesthesia, the skin of the abdomen was prepped and draped in the usual manner.  The abdomen was entered through an incision above the umbilicus, a pursestring suture was placed, Hasson cannula was inserted, and pneumoperitoneum was obtained.  The peritoneal surfaces appeared normal.  A 10 mm and two 5 mm cannulas were placed in the usual fashion.  Dissecting the porta hepatis, the cystic duct- gallbladder junction was identified.  The cystic duct was opened. Cholangiogram was performed with the Reddick catheter under fluoroscopic technique.  This was normal.  The cystic duct was triply clipped and cut, leaving two clips proximally, and the cystic artery was triply clipped and cut, leaving two clips proximally.  The gallbladder was removed via the liver bed with electrocautery current, and hemostasis was obtained with electrocautery current.  The right upper quadrant was irrigated with saline and sucked dry.  The gallbladder was removed in an Endopouch via the umbilical port.  The pursestring suture was tied, cannulas were removed.  Wounds were closed with 4-0 Dexon, and Steri-Strips were applied.  Estimated blood loss minimal.  The patient received no blood and left the operating room in stable condition after sponge and needle counts were verified. DD:  02/10/01 TD:  02/11/01 Job:  [id number]

## 2011-02-05 NOTE — Op Note (Signed)
NAME:  Ashley [name], Ashley [name]                    ACCOUNT NO.:  [id number]   MEDICAL RECORD NO.:  [id number]                   PATIENT TYPE:  OIB   LOCATION:  5006                                 FACILITY:  [facility]   PHYSICIAN:  Sharolyn Douglas, M.D.                     DATE OF BIRTH:  01-01-1961   DATE OF PROCEDURE:  02/27/2004  DATE OF DISCHARGE:  02/28/2004                                 OPERATIVE REPORT   PREOPERATIVE DIAGNOSIS:  Cervical spondylotic radiculopathy, C5-6.   POSTOPERATIVE DIAGNOSIS:  Cervical spondylotic radiculopathy, C5-6.   PROCEDURE:  1. Anterior cervical diskectomy, C5-6, with decompression of the spinal cord     and nerve roots bilaterally.  2. Anterior cervical arthrodesis, C5-6, with placement of an invasive     allograft prosthesis, spacer packed with local autogenous bone graft.  3. Anterior cervical plating, C5-6, utilizing the [employer] system.   SURGEON:  Sharolyn Douglas, M.D.   ASSISTANT:  Verlin Fester, P.A.   ANESTHESIA:  General endotracheal anesthesia.   COMPLICATIONS:  None.   INDICATIONS:  The patient  is a  50 year old female with severe right upper  extremity radiculopathy thought to be secondary to C5-6 radicular  spondylosis and disk herniation.  She has failed to respond to appropriate  conservative measures.  Risks, benefits and alternatives of ACDF, C5-6, with  allograft and plate were reviewed.  She elected to proceed.   DESCRIPTION OF PROCEDURE:  The patient was properly identified in the  holding area and taken to the operating room.  She underwent general  endotracheal anesthesia without difficulty.  She was given prophylactic IV  antibiotics.  She was carefully positioned on the operating room table with  the Mayfield head rest.  Her neck was placed in slight extension.  Five  pounds of halter traction applied.  Neck prepped, draped in the usual  sterile fashion.  A 4-cm transverse incision was made at the level of the  cricoid  cartilage in a natural skin crease on the left side.  Dissection was  carried sharply to the platysma.  The interval between the SCM and strap  muscles medially was developed down to the prevertebral space.  The C5-6  level was identified and a spinal needle placed to confirm location.  Intraoperative x-ray was taken.  The esophagus, trachea, carotid sheath were  identified and protected all times.  The longus colli muscle was elevated  out of the C5-6 interspace.  Deep Shurly retractor placed.  Caspar  distraction pins placed C5 and C6 vertebral bodies.  Microscope draped and  brought into the field.  Diskectomy was carried out to the posterior  longitudinal ligament using curets and pituitary rongeurs.  The disk  material was degenerative.  The disk space was narrowed.  Uncovertebral  osteophytes and posterior lipping identified.  A high-speed burr was used to  remove the posterior osteophytes as well as take down the uncovertebral  joints.  The posterior vertebral margins were under-cut using the 2-mm  Kerrison.  Wide foraminotomies were completed bilaterally.  We felt we had a  good decompression of the foramen.  The cartilaginous end plates were taken  down with the high-speed burr.  An 8-mm allograft prosthesis spacer was  packed with the local autogenous bone graft from the burr shavings and  carefully impacted into the interspace, counter-sunk 1 mm.  Anterior  cervical plate, 24 mm from the [employer] system was then chosen and  fixed with four 12-mm screws.  We had good screw purchase.  We ensured the  locking mechanism engaged.  Bleeding was controlled with bipolar  electrocautery and Gelfoam.  The wound was irrigated, the esophagus,  trachea, carotid sheath were inspected and there were no apparent injuries.  Deep Penrose drain left in place.  Platysma was closed with interrupted 2-0  Vicryl followed by 3-0 Vicryl in the subcutaneous layer and a 4-0 running  subcuticular  Vicryl suture to approximate the skin edges.  Benzoin and Steri-  Strips were placed.  Sterile dressing was applied.  Soft collar was applied.  The patient was extubated without difficulty and transferred to the recovery  room in stable condition, able to move her upper and lower extremities.                                               Sharolyn Douglas, M.D.    MC/MEDQ  D:  03/01/2004  T:  03/02/2004  Job:  [id number]

## 2011-02-05 NOTE — H&P (Signed)
[facility]  Patient:    Ashley, [name]                 MRN: [id number] Adm. Date:  [id number] Attending:  Thyra Breed CC:         Petra Kuba, M.D.  Candy Sledge, M.D.  Evette Georges, M.D. [facility]  Dr. Gaynell Face   History and Physical  NEW PATIENT EVALUATION  HISTORY OF PRESENT ILLNESS:  Ashley [name] is a 50 year old who is sent to [place] by Dr. Fransisca Connors for evaluation and consideration for intercostal nerve blocks.  The patient has a history of abdominal pain which began approximately 10 weeks ago when she was taken off of Neurontin and switched to Depakote.  With the switch to Depakote, she developed vomiting and wretching and within two to three days she developed right upper quadrant pain.  She was initially noted to have tenderness in the right inframammary region and saw Dr. Maple Hudson, her gynecologist, who examined her and was concerned it might be gallbladder disease and sent her to Dr. Lina Sar.  At that time she was describing a very sharp localized discomfort.  Associated with this she was having nausea with eating and increased pain as well as diarrhea.  She has had persistent diarrhea since this time.  She has noted no blood in her stools.  Dr. Juanda Chance apparently obtained some laboratory investigations and did not feel that her pain was GI in origin.  She saw Dr. Clent Ridges, her primary care physician, who performed a CT scan which she was advised was unremarkable.  She underwent a chest x-ray to look for rib fractures which apparently did not show these problems, but she was told that she probably had some neuritis.  She was sent to Dr. Noreene Filbert who saw her initially on December 08, 2000, although the date of dictation is listed as December 09, 1999, at which time he felt she might have a radicular component to her pain and obtained a thoracic MRI which was interpreted by Dr. Orlin Hilding on December 28, 2000, as being normal.  She  was seen by Dr. Ewing Schlein, her previous gastroenterologist who had diagnosed her as having irritable bowel syndrome, and she underwent an ultrasound of the gallbladder which was apparently normal.  Laboratory investigations she was advised showed some mild abnormalities of her liver function tests, although she did not know the results of these.  Yesterday, she underwent a hepatobiliary scan which was interpreted as normal with gallbladder ejection fraction of 47.2%.  She describes her pain as a constant achy feeling in the right upper quadrant which gets sharp like spasms with eating.  She denied any numbness and tingling over the abdomen.  She denied any blood diarrhea, but does have have diarrhea and this has been fairly troublesome and has been attributed to irritable bowel syndrome.  She notes that her pain is made worse by lifting weights, especially with right upper extremity.  Two liter bottles of pop can aggravate this.  Heat will reduce her pain to an extent.  She has been tried on Phenergan, Zofran, Donnatal, and Lomotil for the nausea and diarrhea and given Vicodin and Ultram, which she states during the day will help to a degree, but at night is minimally helpful.  She does not note that these medications exacerbate her discomfort.  She complains predominantly of the pain in the inframammary right upper quadrant area.  CURRENT MEDICATIONS:  1. Donnatal p.r.n.  2. Phenergan 25 mg  q.6-8h.  3. Lomotil  4. Vicodin 5/500 1 q.4h. p.r.n.; she takes usually about 2 of these a day.  5. Xanax 2 mg 2 x q.d.  6. Ultram 50 mg 1 q.6-8h. which sometimes reduces her discomfort.  7. Zofran 4 mg q.8-12h.  8. Nasacort.  9. Proventil. 10. Singulair. 11. Protonix. 12. Valtrex. 13. Wellbutrin. 14. Ambien. 15. Seroquel. 16. Imitrex. 17. Claritin. 18. Proventil.  ALLERGIES:  ASPIRIN.  FAMILY HISTORY:  Positive for cancer, coronary artery disease, hypertension, and seizures.  PAST  SURGICAL HISTORY:  Significant for left knee arthroscopy, two shoulder surgeries for an impingement syndrome after a dislocated shoulder, ultimately requiring a distal clavectomy, and sinus surgery.  ACTIVE MEDICAL PROBLEMS:  Asthma, sleep apnea syndrome which seems to be exacerbated by her Neurontin, irritable bowel syndrome, manic depression, genital herpes, gastroesophageal reflux disease with hiatal hernia, and migraines.  REVIEW OF SYSTEMS:  GENERAL:  Negative.  HEAD:  Significant for migraines. EYES:  Significant for blurred vision at times.  NOSE/MOUTH/THROAT:  Negative. EARS:  Negative.  PULMONARY:  Positive for asthma.    CARDIOVASCULAR: Negative.  GI:  See HPI.  GU:  Negative, except for herpes.  MUSCULOSKELETAL: See past surgical history.   NEUROLOGIC:  Negative for seizures, strokes, or numbness and tingling anywhere.  HEMATOLOGIC:  Negative.  CUTANEOUS:  Positive for herpes and skin changes from her heating pad.  ENDOCRINE:  Negative. PSYCHIATRIC/ALLERGY/IMMUNOLOGIC:  Please see active medical problems.  PHYSICAL EXAMINATION:  VITAL SIGNS:  Blood pressure 123/90, heart rate 88, respiratory rate 18, O2 saturation 94%, and pain level 3/10.  GENERAL:  This is a pleasant female in no acute distress.  HEAD:  Normocephalic, atraumatic.  EYES:  Extraocular movements intact with conjunctivae and sclerae clear.  NOSE:  Patent nares without discharge.  OROPHARYNX:  Free of lesions.  NECK:  Supple without lymphadenopathy.  Carotids were 2+ and symmetric without bruits.  LUNGS:  Clear.  HEART:  Regular rate and rhythm.  BREASTS/ABDOMINAL/PELVIC/RECTAL:  Not performed.  ABDOMEN:  Tenderness on palpation of midepigastric region with no appreciable  organomegaly.  She was tender over the right lower rib cage with what appeared to be some discreet tenderness over some of the ribs.  She showed evidence of erythema at ab igne over the right lower quadrant.  Posteriorly she  had a discreet area of swelling which felt muscular paraspinally in the lower thoracic regions on the right side.  Maneuvers to increase discomfort in the rectus abdominal muscles did not exacerbate her pain.  EXTREMITIES:  No clubbing, cyanosis, or edema with radial pulses and dorsalis pedis pulses 2+ and symmetric.  NEUROlOGIC:  Oriented x 4.  Cranial nerves II-XII are grossly intact.  Deep tendon reflexes were symmetric in the upper extremities and lower extremities with downgoing toes.  Motor was 5/5 with symmetric bulk and tone.  Sensory was intact to vibratory sense and pinprick.  IMPRESSION:  1. Right upper quadrant pain, etiology unclear.  It seems to have a     musculoskeletal component which is not easily explained by her history of     increased symptoms with eating, rule out possible gastropathy, pancreatic     dysfunction, or transverse colon pathology in addition.  I doubt that     there is a component of intercostal neuropathy based on her exam.  2. Multiple other medical problems per her psychiatrist and primary care     physician which include asthma, sleep apnea, irritable bowel syndrome,     manic  depression genital herpes, gastroesophageal reflux disease with     hiatal hernia, and migraine headaches, rule out possible abdominal     migraine equivalent, which I doubt.  DISPOSITION:  1. I advised the patient that I wish to try her with Lidoderm patches applied     to the area of tenderness 12 hours on and 12 hours off.  A prescription     was written for 30 and she was explained about the potential side effects     of this.  2. Bone scan to assess whether she might have an occult rib fracture.  3. Followup with me in 4 weeks.  4. I encouraged her to follow up closely with Dr. Ewing Schlein to make sure that     there is no gastrointestinal etiology, especially since diet seems to     exacerbate her discomfort.  Alternative treatments might include Keppra     or another  anticonvulsant since the Neurontin seemed to do away with her     pain in an effort to try and find something that will not exacerbate her     sleep apnea syndrome. DD:  01/19/01 TD:  01/20/01 Job: [id number]

## 2011-02-05 NOTE — H&P (Signed)
NAME:  Ashley [name], Ashley [name]                    ACCOUNT NO.:  [id number]   MEDICAL RECORD NO.:  [id number]                   PATIENT TYPE:  OIB   LOCATION:                                       FACILITY:  [facility]   PHYSICIAN:  Sharolyn Douglas, M.D.                     DATE OF BIRTH:  1961-06-08   DATE OF ADMISSION:  02/27/2004  DATE OF DISCHARGE:                                HISTORY & PHYSICAL   CHIEF COMPLAINT:  Pain in my neck, right shoulder and arm.   HISTORY OF PRESENT ILLNESS:  This 50 year old white female who continues  with persistent right upper-extremity pain with headaches nd cervical  discomfort.  This has been going on now for some time.  She can recall an  incident which occurred about six or seven months ago.  She was descending  some steps, carrying a heavy box, when she fell.  She attempted to continue  holding on to the box despite her fall.  She has immediate if not rapid  onset of pain onto her right shoulder and arm.  Now it has become more  uncomfortable to the point where she is markedly uncomfortable.  She now has  marked limitation of range of motion in the cervical spine.  Cervical  compression test as well as Spurling's is positive.  She also has weakness  in the right upper extremity.  X-rays showed a loss of the normal cervical  lordosis.  MRI has shown a C5-C6 herniated nucleus pulposus on the right.  Due to consideration of the fact this patient did not respond to analgesics  as well as conservative care, it was felt she would benefit from surgical  intervention.  She is being admitted for anterior cervical diskectomy and  fusion at C5-C6 without allograft and plate.   The patient also complains of some right wrist pain.  This had been going on  for a little while.  It is felt that she had a right wrist tendonitis, and  she is fitted with a Futuro brace.   PAST MEDICAL HISTORY:  The patient has had carpal tunnel release March 2004,  cholecystectomy 2002,  left shoulder surgery in March and November of 1994,  and sinus surgery of some kind in May of 1985.  Dr. Tera Mater. Clent Ridges is her  family physician.   CURRENT MEDICATIONS:  1. Kcl 10 mEq, one b.i.d.  2. Oxycodone p.r.n. pain.  3. Zanaflex 3 mg, morning, afternoon and evening.  4. Ditropan 5 mg, one b.i.d.  5. Xanax 1 mg in the a.m., 0.5 mg in the afternoon, and 1.5 mg at bedtime.  6. Trazodone 25 mg at bedtime.  7. Seroquel 25 mg, one at bedtime.  8. Wellbutrin 150 mg in the morning.  9. Protonix 40 mg at bedtime.  10.      Valtrex 50 mg q.o.d.  11.      Rhinocort  32 micrograms, two puffs each nostril at bedtime.  12.      Pulmicort Turbuhaler 200 micrograms, two puffs in the morning.  13.      Depo-Provera injection once every three months.  14.      P.r.n. medications:  Lidoderm patches, Imitrex nasal spray,     albuterol, Reglan and Phenergan.  15.      She takes loratadine as an antihistamine, 10 mg q.a.m., and calcium     tablet as well.  16.      Loperamide hydrochlorothiazide.  She takes 2 mg p.r.n. q.a.m.   ALLERGIES:  ASPIRIN which causes airway constriction and CIPRO which causes  much the same.  She has difficulty with MORPHINE.   MEDICAL PROBLEMS:  1. Hiatal hernia.  2. Reflux.  3. Asthma.  4. Bronchitis.  5. She is bipolar.   FAMILY HISTORY:  Positive for heart disease, hypertension, cancer and  stroke.   SOCIAL HISTORY:  The patient is married.  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  She stopped on Jan 28, 2004, her smoking.  She  occasionally partakes of ETOH.  No children.  Husband, mother, friend and in  laws will be her caregiver when she gets home.   REVIEW OF SYSTEMS:  CNS:  No seizures, shoulder paralysis, numbness or  double vision, but the patient had radiculitis consistent with C5-C6 nerve  root impingement on the right.  CARDIOVASCULAR:  No chest pain, no  orthopnea.  RESPIRATORY:  No productive cough, no hemoptysis, no shortness   of breath.  GASTROINTESTINAL:  No nausea, vomiting, melena or bloody stools.  She does have diarrhea from time to time secondary to her irritable bowel  syndrome.  She takes medications and does well with her breathing above.  GENITOURINARY:  No discharge or hematuria.  MUSCULOSKELETAL:  Primarily in  present illness.   PHYSICAL EXAMINATION:  GENERAL:  Alert, cooperative, friendly, a 50 year old  white female accompanied by her husband Ashley [name].  VITAL SIGNS:  Blood pressure 142/82, pulse 88, respirations 12.  HEENT:  Normocephalic, PERRLA, EOM intact.  Oropharynx is clear.  CHEST:  Clear to auscultation.  No rhonchi or rales, no wheezes.  HEART:  Regular rate and rhythm, no murmurs are heard.  ABDOMEN:  Soft, nontender.  Spleen is not felt.  GENITALIA/ RECTAL/ PELVIC/ BREAST:  Not done.  Not pertinent in present  illness.   ADMITTING DIAGNOSES:  1. Herniated nucleus pulposus at C5-C6.  2. Bipolar personality.  3. Irritable bowel syndrome.  4. Migraines.  5. Asthma.  6. History of postoperative nausea.   PLAN:  The patient is to undergo anterior cervical diskectomy and fusion C5-  C6 with allograft and plate.      Dooley L. Cherlynn June.                 Sharolyn Douglas, M.D.    DLU/MEDQ  D:  02/18/2004  T:  02/18/2004  Job:  [id number]   cc:   Jeannett Senior A. Clent Ridges, M.D. [facility]

## 2011-02-05 NOTE — Op Note (Signed)
NAMESHARLA, Ashley [name]            ACCOUNT NO.:  [id number]   MEDICAL RECORD NO.:  [id number]          PATIENT TYPE:  AMB   LOCATION:  DSC                          FACILITY:  [facility]   PHYSICIAN:  Cindee Salt, M.D.       DATE OF BIRTH:  [birth year]   DATE OF PROCEDURE:  07/01/2006  DATE OF DISCHARGE:                                 OPERATIVE REPORT   PREOPERATIVE DIAGNOSIS:  Stenosing tenosynovitis, right thumb.   POSTOPERATIVE DIAGNOSIS:  Stenosing tenosynovitis, right thumb.   OPERATION:  Release A1 pulley, right thumb.   SURGEON:  Dr. Cindee Salt.   ANESTHESIA:  Forearm based IV regional.   HISTORY:  The patient is a 50 year old female with a history of chronic  regional pain in her left arm.  She is seen with a locked trigger thumb on  her right thumb.  She is admitted for release.  She is aware of the risks  and complications including infection, recurrence, injury to arteries,  nerves, tendons, complete relief of symptoms, recurrence of the dystrophy.  The area was marked by both the patient and surgeon in the preoperative  area.  A stellate ganglion block given for crypts protection.   DESCRIPTION OF PROCEDURE:  The patient was brought to the operating room  where a forearm based IV regional anesthetic was carried out without  difficulty.  She was prepped using DuraPrep, supine position, right arm  free.  A transverse incision was made over the metacarpophalangeal joint  crease of the thumb, carried down through subcutaneous tissue.  The  neurovascular structures were identified and protected, retractors placed.  The A1 pulley was identified.  This was released on its radial aspect.  The  oblique pulley was left intact.  The thumb was easily able to be moved  through a full range of motion, no further triggering was identified.  The  wound was irrigated and the skin closed with interrupted 5-0 nylon sutures.  A sterile compressive dressing was applied.  The patient tolerated  the  procedure well and was taken to the recovery room for observation in  satisfactory condition.  She is presently on Norco and OxyContin for pain.  She will be discharged home to return in 1 week.           ______________________________  Cindee Salt, M.D.     GK/MEDQ  D:  07/01/2006  T:  07/04/2006  Job:  [id number]

## 2011-02-05 NOTE — Assessment & Plan Note (Signed)
[facility]                                 ON-CALL NOTE   NAME:[name], Ashley SILVERTHORNE               MRN:          [id number]  DATE:11/25/2006                            DOB:          Aug 21, 1961    She is a patient of Dr. Claris Che.   TIME OF CALL:  05:36 p.m.   Called stating that she called in needing Nystatin swish and spit for a  thrush.  The patient had a procedure done recently, she had an epidural  and states that she gets thrush every time she gets that done and she  called yesterday for a prescription and nobody had returned her message  yet.  Nystatin swish and spit 150 cc 5 mL p.o. q.i.d., swish and spit,  p.r.n. and the patient will follow up with Dr. Clent Ridges next week if symptoms  not improved.     Lelon Perla, DO  Electronically Signed    Shawnie Dapper  DD: 11/25/2006  DT: 11/26/2006  Job #: [id number]

## 2011-02-12 ENCOUNTER — Other Ambulatory Visit: Payer: Self-pay | Admitting: *Deleted

## 2011-02-12 MED ORDER — ALPRAZOLAM 2 MG PO TABS: 2.0000 mg | ORAL_TABLET | Freq: Three times a day (TID) | ORAL | Status: AC | PRN

## 2011-02-12 NOTE — Telephone Encounter (Signed)
rx faxed to pharmacy

## 2011-02-12 NOTE — Telephone Encounter (Signed)
Refill xanax 2mg  1 po tid, originally rx'd by Dr Deboraha Sprang

## 2011-02-12 NOTE — Telephone Encounter (Signed)
Call in #90 with 5 rf 

## 2011-03-04 ENCOUNTER — Ambulatory Visit (INDEPENDENT_AMBULATORY_CARE_PROVIDER_SITE_OTHER): Payer: Medicare Other | Admitting: Family Medicine

## 2011-03-04 VITALS — BP 120/80 | HR 78 | Wt 157.0 lb

## 2011-03-04 DIAGNOSIS — Z309 Encounter for contraceptive management, unspecified: Secondary | ICD-10-CM

## 2011-03-04 MED ORDER — MEDROXYPROGESTERONE ACETATE 150 MG/ML IM SUSP
150.0000 mg | Freq: Once | INTRAMUSCULAR | Status: AC
  Administered 2011-03-04: 150 mg via INTRAMUSCULAR

## 2011-03-10 ENCOUNTER — Telehealth: Payer: Self-pay | Admitting: *Deleted

## 2011-03-10 NOTE — Telephone Encounter (Signed)
Refill oxybutynin 5 mg. Last CPE 11/19/09.

## 2011-03-10 NOTE — Telephone Encounter (Signed)
Rx refill oxybutynin sent to [facility], previously requested last week.

## 2011-03-11 NOTE — Telephone Encounter (Signed)
Call in Oxybutynin 5 mg bid, #60 with 11 rf

## 2011-03-12 MED ORDER — OXYBUTYNIN CHLORIDE 5 MG PO TABS: 5.0000 mg | ORAL_TABLET | Freq: Two times a day (BID) | ORAL | Status: DC

## 2011-03-12 NOTE — Telephone Encounter (Signed)
rx sent to pharmacy

## 2011-04-07 ENCOUNTER — Other Ambulatory Visit (INDEPENDENT_AMBULATORY_CARE_PROVIDER_SITE_OTHER): Payer: Medicare Other

## 2011-04-07 DIAGNOSIS — Z0000 Encounter for general adult medical examination without abnormal findings: Secondary | ICD-10-CM

## 2011-04-07 DIAGNOSIS — Z79899 Other long term (current) drug therapy: Secondary | ICD-10-CM

## 2011-04-07 LAB — CBC WITH DIFFERENTIAL/PLATELET
Eosinophils Absolute: 0.1 10*3/uL (ref 0.0–0.7)
Eosinophils Relative: 0.6 % (ref 0.0–5.0)
HCT: 42 % (ref 36.0–46.0)
Hemoglobin: 14.2 g/dL (ref 12.0–15.0)
Lymphocytes Relative: 32.6 % (ref 12.0–46.0)
MCHC: 33.8 g/dL (ref 30.0–36.0)
Monocytes Absolute: 0.6 10*3/uL (ref 0.1–1.0)
Neutrophils Relative %: 59.9 % (ref 43.0–77.0)
Platelets: 252 10*3/uL (ref 150.0–400.0)
RBC: 4.4 Mil/uL (ref 3.87–5.11)

## 2011-04-08 LAB — LIPID PANEL
Cholesterol: 154 mg/dL (ref 0–200)
Triglycerides: 80 mg/dL (ref 0.0–149.0)

## 2011-04-08 LAB — HEPATIC FUNCTION PANEL
AST: 24 U/L (ref 0–37)
Albumin: 4.6 g/dL (ref 3.5–5.2)
Alkaline Phosphatase: 105 U/L (ref 39–117)
Total Protein: 7.1 g/dL (ref 6.0–8.3)

## 2011-04-08 LAB — BASIC METABOLIC PANEL
CO2: 23 mEq/L (ref 19–32)
GFR: 60.1 mL/min (ref >60.00)
Glucose, Bld: 79 mg/dL (ref 70–99)
Potassium: 4.1 mEq/L (ref 3.5–5.1)
Sodium: 140 mEq/L (ref 135–145)

## 2011-04-09 NOTE — Progress Notes (Signed)
Pt has CPE with Dr. Clent Ridges next week. Will discuss labs at [facility], [place]

## 2011-04-14 ENCOUNTER — Encounter: Payer: Self-pay | Admitting: Family Medicine

## 2011-04-14 ENCOUNTER — Ambulatory Visit (INDEPENDENT_AMBULATORY_CARE_PROVIDER_SITE_OTHER): Payer: Medicare Other | Admitting: Family Medicine

## 2011-04-14 VITALS — BP 120/80 | HR 87 | Temp 98.4°F | Ht 63.0 in | Wt 153.0 lb

## 2011-04-14 DIAGNOSIS — H579 Unspecified disorder of eye and adnexa: Secondary | ICD-10-CM

## 2011-04-14 DIAGNOSIS — Z0000 Encounter for general adult medical examination without abnormal findings: Secondary | ICD-10-CM

## 2011-04-14 MED ORDER — POTASSIUM CHLORIDE CRYS ER 20 MEQ PO TBCR: 20.0000 meq | EXTENDED_RELEASE_TABLET | Freq: Two times a day (BID) | ORAL | Status: DC

## 2011-04-14 NOTE — Progress Notes (Signed)
  Subjective:    Patient ID: Ashley [name], female    DOB: Apr 06, 1961, 50 y.o.   MRN: [id number]  HPI 50 yr old female for a cpx. She feels well and is proud to say she has lost 48 lbs. She feels better and has more energy. She does mention a spot on the right lower eyelid that came up about a year ago and is slowly getting larger. It is not painful but it is impinging on her visual field.    Review of Systems  Constitutional: Negative.   HENT: Negative.   Eyes: Negative.   Respiratory: Negative.   Cardiovascular: Negative.   Gastrointestinal: Negative.   Genitourinary: Negative for dysuria, urgency, frequency, hematuria, flank pain, decreased urine volume, enuresis, difficulty urinating, pelvic pain and dyspareunia.  Musculoskeletal: Negative.   Skin: Negative.   Neurological: Negative.   Hematological: Negative.   Psychiatric/Behavioral: Negative.        Objective:   Physical Exam  Constitutional: She is oriented to person, place, and time. She appears well-developed and well-nourished. No distress.  HENT:  Head: Normocephalic and atraumatic.  Right Ear: External ear normal.  Left Ear: External ear normal.  Nose: Nose normal.  Mouth/Throat: Oropharynx is clear and moist. No oropharyngeal exudate.  Eyes: Conjunctivae and EOM are normal. Pupils are equal, round, and reactive to light. No scleral icterus.       There is a nontender cystic lesion on the right lower lid in the lateral corner  Neck: Normal range of motion. Neck supple. No JVD present. No thyromegaly present.  Cardiovascular: Normal rate, regular rhythm, normal heart sounds and intact distal pulses.  Exam reveals no gallop and no friction rub.   No murmur heard. Pulmonary/Chest: Effort normal and breath sounds normal. No respiratory distress. She has no wheezes. She has no rales. She exhibits no tenderness.  Abdominal: Soft. Bowel sounds are normal. She exhibits no distension and no mass. There is no tenderness. There  is no rebound and no guarding.  Musculoskeletal: Normal range of motion. She exhibits no edema and no tenderness.  Lymphadenopathy:    She has no cervical adenopathy.  Neurological: She is alert and oriented to person, place, and time. She has normal reflexes. No cranial nerve deficit. She exhibits normal muscle tone. Coordination normal.  Skin: Skin is warm and dry. No rash noted. No erythema.  Psychiatric: She has a normal mood and affect. Her behavior is normal. Judgment and thought content normal.          Assessment & Plan:  Continue current meds. I congratulated her on the weight loss. She has what appears to be a mucus cyst on the eyelid, so we will refer to Ophthalmology to remove it

## 2011-04-30 ENCOUNTER — Encounter: Payer: Self-pay | Admitting: Family Medicine

## 2011-04-30 ENCOUNTER — Ambulatory Visit (INDEPENDENT_AMBULATORY_CARE_PROVIDER_SITE_OTHER): Payer: Medicare Other | Admitting: Family Medicine

## 2011-04-30 VITALS — BP 120/80 | HR 100 | Temp 98.9°F | Wt 151.0 lb

## 2011-04-30 DIAGNOSIS — G8929 Other chronic pain: Secondary | ICD-10-CM

## 2011-04-30 DIAGNOSIS — M797 Fibromyalgia: Secondary | ICD-10-CM

## 2011-04-30 DIAGNOSIS — M545 Low back pain, unspecified: Secondary | ICD-10-CM

## 2011-04-30 DIAGNOSIS — IMO0001 Reserved for inherently not codable concepts without codable children: Secondary | ICD-10-CM

## 2011-04-30 MED ORDER — OXYMORPHONE HCL ER 5 MG PO TB12: 5.0000 mg | ORAL_TABLET | Freq: Two times a day (BID) | ORAL | Status: DC

## 2011-04-30 MED ORDER — HYDROCODONE-ACETAMINOPHEN 10-325 MG PO TABS: 1.0000 | ORAL_TABLET | Freq: Three times a day (TID) | ORAL | Status: DC | PRN

## 2011-04-30 NOTE — Progress Notes (Signed)
  Subjective:    Patient ID: Ashley [name], female    DOB: [birth year], 50 y.o.   MRN: [id number]  HPI Here for medication refills for her back pain and fibromyalgia. She is doing quite well and stays active.    Review of Systems  Constitutional: Negative.   Respiratory: Negative.   Cardiovascular: Negative.   Musculoskeletal: Positive for myalgias and back pain.  Psychiatric/Behavioral: Negative.        Objective:   Physical Exam  Constitutional: She appears well-developed and well-nourished.  Psychiatric: She has a normal mood and affect. Her behavior is normal. Thought content normal.          Assessment & Plan:  Stable pain syndromes. Meds were refilled

## 2011-05-14 ENCOUNTER — Ambulatory Visit: Payer: Medicare Other | Admitting: Family Medicine

## 2011-05-26 ENCOUNTER — Ambulatory Visit (INDEPENDENT_AMBULATORY_CARE_PROVIDER_SITE_OTHER): Payer: Medicare Other | Admitting: Family Medicine

## 2011-05-26 DIAGNOSIS — Z309 Encounter for contraceptive management, unspecified: Secondary | ICD-10-CM

## 2011-05-26 MED ORDER — MEDROXYPROGESTERONE ACETATE 150 MG/ML IM SUSP
150.0000 mg | Freq: Once | INTRAMUSCULAR | Status: AC
  Administered 2011-05-26: 150 mg via INTRAMUSCULAR

## 2011-06-17 ENCOUNTER — Telehealth: Payer: Self-pay | Admitting: Family Medicine

## 2011-06-17 NOTE — Telephone Encounter (Signed)
Refill request for Hydrocodone/APAP 10-325 mg take 1 po q8hrs prn and pt last here on 04/30/11.

## 2011-06-18 NOTE — Telephone Encounter (Signed)
NO we gave her #90 with 5 rf on 04-30-11

## 2011-06-21 ENCOUNTER — Ambulatory Visit: Payer: Medicare Other | Admitting: Family Medicine

## 2011-06-21 NOTE — Telephone Encounter (Signed)
Spoke with pt

## 2011-06-23 LAB — DIFFERENTIAL
Basophils Absolute: 0.1
Basophils Relative: 1
Lymphocytes Relative: 22
Neutro Abs: 7.8 — ABNORMAL HIGH
Neutrophils Relative %: 69

## 2011-06-23 LAB — COMPREHENSIVE METABOLIC PANEL
Alkaline Phosphatase: 133 — ABNORMAL HIGH
BUN: 7
CO2: 27
Chloride: 103
Creatinine, Ser: 0.84
GFR calc non Af Amer: >60
Glucose, Bld: 94
Total Bilirubin: 1.2

## 2011-06-23 LAB — CBC
HCT: 40.2
Hemoglobin: 13.8
MCV: 91.8
Platelets: 270
RBC: 4.38
WBC: 11.3 — ABNORMAL HIGH

## 2011-06-23 LAB — URINALYSIS, ROUTINE W REFLEX MICROSCOPIC
Bilirubin Urine: NEGATIVE
Glucose, UA: NEGATIVE
Hgb urine dipstick: NEGATIVE
Specific Gravity, Urine: 1.006
Urobilinogen, UA: 0.2

## 2011-08-18 ENCOUNTER — Encounter: Payer: Self-pay | Admitting: Family Medicine

## 2011-08-18 ENCOUNTER — Ambulatory Visit (INDEPENDENT_AMBULATORY_CARE_PROVIDER_SITE_OTHER): Payer: Medicare Other | Admitting: Family Medicine

## 2011-08-18 VITALS — BP 122/80 | HR 86 | Temp 98.5°F | Wt 151.0 lb

## 2011-08-18 DIAGNOSIS — F32A Depression, unspecified: Secondary | ICD-10-CM

## 2011-08-18 DIAGNOSIS — IMO0001 Reserved for inherently not codable concepts without codable children: Secondary | ICD-10-CM

## 2011-08-18 DIAGNOSIS — G8929 Other chronic pain: Secondary | ICD-10-CM

## 2011-08-18 DIAGNOSIS — M797 Fibromyalgia: Secondary | ICD-10-CM

## 2011-08-18 DIAGNOSIS — F329 Major depressive disorder, single episode, unspecified: Secondary | ICD-10-CM

## 2011-08-18 DIAGNOSIS — F3289 Other specified depressive episodes: Secondary | ICD-10-CM

## 2011-08-18 DIAGNOSIS — M549 Dorsalgia, unspecified: Secondary | ICD-10-CM

## 2011-08-18 MED ORDER — OXYMORPHONE HCL ER 5 MG PO TB12: ORAL_TABLET | ORAL | Status: DC

## 2011-08-18 MED ORDER — ORPHENADRINE CITRATE 100 MG PO TB12: 100.0000 mg | ORAL_TABLET | Freq: Two times a day (BID) | ORAL | Status: DC

## 2011-08-18 NOTE — Progress Notes (Signed)
  Subjective:    Patient ID: Ashley [name], female    DOB: [birth year], 50 y.o.   MRN: [id number]  HPI Here to follow up on chronic back pain. She has good days and bad days, but in general feels well. Walks for 30 minutes twice a day.    Review of Systems  Constitutional: Negative.   Musculoskeletal: Positive for back pain.  Neurological: Negative.        Objective:   Physical Exam  Constitutional: She is oriented to person, place, and time. She appears well-developed and well-nourished.  Musculoskeletal: Normal range of motion. She exhibits no edema and no tenderness.  Neurological: She is alert and oriented to person, place, and time.          Assessment & Plan:  Refilled meds for 3 months

## 2011-08-25 ENCOUNTER — Ambulatory Visit (INDEPENDENT_AMBULATORY_CARE_PROVIDER_SITE_OTHER): Payer: Medicare Other | Admitting: Family Medicine

## 2011-08-25 DIAGNOSIS — Z23 Encounter for immunization: Secondary | ICD-10-CM

## 2011-08-25 DIAGNOSIS — Z309 Encounter for contraceptive management, unspecified: Secondary | ICD-10-CM

## 2011-08-25 MED ORDER — MEDROXYPROGESTERONE ACETATE 150 MG/ML IM SUSP
150.0000 mg | Freq: Once | INTRAMUSCULAR | Status: AC
  Administered 2011-08-25: 150 mg via INTRAMUSCULAR

## 2011-09-15 ENCOUNTER — Other Ambulatory Visit: Payer: Self-pay | Admitting: Family Medicine

## 2011-11-01 ENCOUNTER — Other Ambulatory Visit: Payer: Self-pay | Admitting: Gynecology

## 2011-11-01 DIAGNOSIS — R928 Other abnormal and inconclusive findings on diagnostic imaging of breast: Secondary | ICD-10-CM

## 2011-11-11 ENCOUNTER — Ambulatory Visit
Admission: RE | Admit: 2011-11-11 | Discharge: 2011-11-11 | Disposition: A | Discharge status: Home / Self Care | Payer: Medicare Other | Source: Ambulatory Visit | Attending: Gynecology | Admitting: Gynecology

## 2011-11-11 DIAGNOSIS — R928 Other abnormal and inconclusive findings on diagnostic imaging of breast: Secondary | ICD-10-CM

## 2011-11-16 ENCOUNTER — Ambulatory Visit (INDEPENDENT_AMBULATORY_CARE_PROVIDER_SITE_OTHER): Payer: Medicare Other | Admitting: Family Medicine

## 2011-11-16 ENCOUNTER — Encounter: Payer: Self-pay | Admitting: Family Medicine

## 2011-11-16 ENCOUNTER — Ambulatory Visit: Payer: Worker's Compensation | Admitting: Family Medicine

## 2011-11-16 VITALS — BP 116/80 | HR 108 | Temp 98.9°F | Wt 154.0 lb

## 2011-11-16 DIAGNOSIS — M545 Low back pain, unspecified: Secondary | ICD-10-CM

## 2011-11-16 DIAGNOSIS — J45909 Unspecified asthma, uncomplicated: Secondary | ICD-10-CM

## 2011-11-16 DIAGNOSIS — M797 Fibromyalgia: Secondary | ICD-10-CM

## 2011-11-16 DIAGNOSIS — Z309 Encounter for contraceptive management, unspecified: Secondary | ICD-10-CM

## 2011-11-16 DIAGNOSIS — J45998 Other asthma: Secondary | ICD-10-CM

## 2011-11-16 DIAGNOSIS — IMO0001 Reserved for inherently not codable concepts without codable children: Secondary | ICD-10-CM

## 2011-11-16 MED ORDER — OXYMORPHONE HCL ER 5 MG PO TB12: ORAL_TABLET | ORAL | Status: DC

## 2011-11-16 MED ORDER — HYDROCODONE-ACETAMINOPHEN 10-325 MG PO TABS: 1.0000 | ORAL_TABLET | Freq: Three times a day (TID) | ORAL | Status: DC | PRN

## 2011-11-16 MED ORDER — MEDROXYPROGESTERONE ACETATE 150 MG/ML IM SUSP
150.0000 mg | Freq: Once | INTRAMUSCULAR | Status: AC
  Administered 2011-11-16: 150 mg via INTRAMUSCULAR

## 2011-11-16 MED ORDER — DICLOFENAC SODIUM 1 % TD GEL: 1.0000 "application " | Freq: Four times a day (QID) | TRANSDERMAL | Status: DC

## 2011-11-16 NOTE — Progress Notes (Signed)
  Subjective:    Patient ID: Ashley [name], female    DOB: 17-Feb-1961, 51 y.o.   MRN: [id number]  HPI Here for  med refills. She is doing fairly well all told. Her HAs are stable and her asthma rarely bothers her.    Review of Systems  Constitutional: Negative.   Respiratory: Negative.   Cardiovascular: Negative.   Musculoskeletal: Positive for back pain.       Objective:   Physical Exam  Constitutional: She appears well-developed and well-nourished.  Cardiovascular: Normal rate, regular rhythm, normal heart sounds and intact distal pulses.   Pulmonary/Chest: Effort normal and breath sounds normal.          Assessment & Plan:  At her baseline. Refilled meds.

## 2012-02-07 ENCOUNTER — Ambulatory Visit (INDEPENDENT_AMBULATORY_CARE_PROVIDER_SITE_OTHER): Payer: Medicare Other | Admitting: Family Medicine

## 2012-02-07 DIAGNOSIS — Z309 Encounter for contraceptive management, unspecified: Secondary | ICD-10-CM

## 2012-02-07 DIAGNOSIS — IMO0001 Reserved for inherently not codable concepts without codable children: Secondary | ICD-10-CM

## 2012-02-07 MED ORDER — MEDROXYPROGESTERONE ACETATE 150 MG/ML IM SUSP
150.0000 mg | Freq: Once | INTRAMUSCULAR | Status: AC
  Administered 2012-02-07: 150 mg via INTRAMUSCULAR

## 2012-02-15 ENCOUNTER — Ambulatory Visit (INDEPENDENT_AMBULATORY_CARE_PROVIDER_SITE_OTHER): Payer: Self-pay | Admitting: Family Medicine

## 2012-02-15 ENCOUNTER — Encounter: Payer: Self-pay | Admitting: Family Medicine

## 2012-02-15 VITALS — BP 114/80 | HR 98 | Temp 98.7°F | Wt 154.0 lb

## 2012-02-15 DIAGNOSIS — M797 Fibromyalgia: Secondary | ICD-10-CM

## 2012-02-15 DIAGNOSIS — IMO0001 Reserved for inherently not codable concepts without codable children: Secondary | ICD-10-CM

## 2012-02-15 DIAGNOSIS — I10 Essential (primary) hypertension: Secondary | ICD-10-CM

## 2012-02-15 MED ORDER — OXYMORPHONE HCL ER 5 MG PO TB12: ORAL_TABLET | ORAL | Status: DC

## 2012-02-15 NOTE — Progress Notes (Signed)
  Subjective:    Patient ID: Ashley [name], female    DOB: July 02, 1961, 51 y.o.   MRN: [id number]  HPI Here to follow up on fibromyalgia and HTN. She feels well in general. She always does better in warmer weather. She has a handicapped form to sign from the [facility].    Review of Systems  Constitutional: Negative.   Respiratory: Negative.   Cardiovascular: Negative.   Musculoskeletal: Positive for myalgias and arthralgias.       Objective:   Physical Exam  Constitutional: She appears well-developed and well-nourished.  Cardiovascular: Normal rate, regular rhythm, normal heart sounds and intact distal pulses.   Pulmonary/Chest: Effort normal and breath sounds normal.          Assessment & Plan:  Refilled meds for 3 months .

## 2012-02-16 ENCOUNTER — Ambulatory Visit: Payer: Medicare Other | Admitting: Family Medicine

## 2012-04-07 ENCOUNTER — Other Ambulatory Visit: Payer: Self-pay | Admitting: *Deleted

## 2012-04-07 MED ORDER — OXYBUTYNIN CHLORIDE 5 MG PO TABS: 5.0000 mg | ORAL_TABLET | Freq: Two times a day (BID) | ORAL | Status: DC

## 2012-04-26 ENCOUNTER — Ambulatory Visit (INDEPENDENT_AMBULATORY_CARE_PROVIDER_SITE_OTHER): Payer: Medicare Other | Admitting: Family Medicine

## 2012-04-26 DIAGNOSIS — Z3040 Encounter for surveillance of contraceptives, unspecified: Secondary | ICD-10-CM

## 2012-04-26 MED ORDER — MEDROXYPROGESTERONE ACETATE 150 MG/ML IM SUSP
150.0000 mg | Freq: Once | INTRAMUSCULAR | Status: AC
  Administered 2012-04-26: 150 mg via INTRAMUSCULAR

## 2012-04-27 ENCOUNTER — Ambulatory Visit: Payer: Medicare Other | Admitting: Family Medicine

## 2012-05-19 ENCOUNTER — Telehealth: Payer: Self-pay | Admitting: Family Medicine

## 2012-05-19 MED ORDER — POTASSIUM CHLORIDE CRYS ER 20 MEQ PO TBCR: 20.0000 meq | EXTENDED_RELEASE_TABLET | Freq: Two times a day (BID) | ORAL | Status: DC

## 2012-05-19 NOTE — Telephone Encounter (Signed)
Refill request for Potassium and I did send script e-scribe

## 2012-05-26 ENCOUNTER — Encounter: Payer: Self-pay | Admitting: Family Medicine

## 2012-05-26 ENCOUNTER — Ambulatory Visit (INDEPENDENT_AMBULATORY_CARE_PROVIDER_SITE_OTHER): Payer: Medicare Other | Admitting: Family Medicine

## 2012-05-26 VITALS — BP 112/76 | HR 102 | Temp 98.6°F | Wt 165.0 lb

## 2012-05-26 DIAGNOSIS — J45909 Unspecified asthma, uncomplicated: Secondary | ICD-10-CM

## 2012-05-26 DIAGNOSIS — IMO0001 Reserved for inherently not codable concepts without codable children: Secondary | ICD-10-CM

## 2012-05-26 DIAGNOSIS — F329 Major depressive disorder, single episode, unspecified: Secondary | ICD-10-CM

## 2012-05-26 DIAGNOSIS — I10 Essential (primary) hypertension: Secondary | ICD-10-CM

## 2012-05-26 DIAGNOSIS — M545 Low back pain: Secondary | ICD-10-CM

## 2012-05-26 MED ORDER — OXYMORPHONE HCL ER 5 MG PO TB12: ORAL_TABLET | ORAL | Status: DC

## 2012-05-26 MED ORDER — HYDROCODONE-ACETAMINOPHEN 10-325 MG PO TABS: 1.0000 | ORAL_TABLET | Freq: Three times a day (TID) | ORAL | Status: DC | PRN

## 2012-05-26 NOTE — Progress Notes (Signed)
  Subjective:    Patient ID: Ashley [name], female    DOB: 07/25/1961, 51 y.o.   MRN: [id number]  HPI Here for med refills. She has had a good summer but her lower back is starting to get worse again. Her last radiofrequency ablation was done 3 years ago, and she wonders if she needs another one.    Review of Systems  Constitutional: Negative.   Respiratory: Negative.   Cardiovascular: Negative.   Musculoskeletal: Positive for myalgias and back pain.       Objective:   Physical Exam  Constitutional: She appears well-developed and well-nourished.  Neck: No thyromegaly present.  Cardiovascular: Normal rate, regular rhythm, normal heart sounds and intact distal pulses.   Pulmonary/Chest: Effort normal and breath sounds normal.  Lymphadenopathy:    She has no cervical adenopathy.          Assessment & Plan:  Everything seems to be stable although her back pain is worsening. Advised her to see Dr. Joanna Puff again to ask about another ablation.

## 2012-06-09 ENCOUNTER — Other Ambulatory Visit: Payer: Medicare Other

## 2012-06-20 ENCOUNTER — Encounter: Payer: Medicare Other | Admitting: Family Medicine

## 2012-07-05 ENCOUNTER — Ambulatory Visit: Payer: Self-pay | Admitting: Pain Medicine

## 2012-07-11 ENCOUNTER — Ambulatory Visit (INDEPENDENT_AMBULATORY_CARE_PROVIDER_SITE_OTHER): Payer: Medicare Other | Admitting: Family Medicine

## 2012-07-11 ENCOUNTER — Other Ambulatory Visit (INDEPENDENT_AMBULATORY_CARE_PROVIDER_SITE_OTHER): Payer: Medicare Other

## 2012-07-11 DIAGNOSIS — Z79899 Other long term (current) drug therapy: Secondary | ICD-10-CM

## 2012-07-11 DIAGNOSIS — Z23 Encounter for immunization: Secondary | ICD-10-CM

## 2012-07-11 DIAGNOSIS — Z0000 Encounter for general adult medical examination without abnormal findings: Secondary | ICD-10-CM

## 2012-07-11 LAB — HEPATIC FUNCTION PANEL
AST: 21 U/L (ref 0–37)
Albumin: 4.1 g/dL (ref 3.5–5.2)
Total Bilirubin: 0.7 mg/dL (ref 0.3–1.2)

## 2012-07-11 LAB — LIPID PANEL
HDL: 38.3 mg/dL — ABNORMAL LOW (ref >39.00)
LDL Cholesterol: 112 mg/dL — ABNORMAL HIGH (ref 0–99)
Total CHOL/HDL Ratio: 5
Triglycerides: 115 mg/dL (ref 0.0–149.0)

## 2012-07-11 LAB — CBC WITH DIFFERENTIAL/PLATELET
Basophils Relative: 0.1 % (ref 0.0–3.0)
Eosinophils Absolute: 0.1 10*3/uL (ref 0.0–0.7)
Eosinophils Relative: 0.7 % (ref 0.0–5.0)
Hemoglobin: 14.7 g/dL (ref 12.0–15.0)
Lymphocytes Relative: 26.4 % (ref 12.0–46.0)
MCHC: 33.2 g/dL (ref 30.0–36.0)
MCV: 95.4 fl (ref 78.0–100.0)
Monocytes Absolute: 0.6 10*3/uL (ref 0.1–1.0)
Neutro Abs: 5.4 10*3/uL (ref 1.4–7.7)
Neutrophils Relative %: 65.1 % (ref 43.0–77.0)
RBC: 4.66 Mil/uL (ref 3.87–5.11)
WBC: 8.3 10*3/uL (ref 4.5–10.5)

## 2012-07-11 LAB — BASIC METABOLIC PANEL
CO2: 24 mEq/L (ref 19–32)
Chloride: 105 mEq/L (ref 96–112)
Sodium: 138 mEq/L (ref 135–145)

## 2012-07-11 NOTE — Progress Notes (Signed)
  Subjective:    Patient ID: Ashley [name], female    DOB: February 25, 1961, 51 y.o.   MRN: [id number]  HPI    Review of Systems     Objective:   Physical Exam        Assessment & Plan:  Given a flu shot only

## 2012-07-13 LAB — TSH: TSH: 1.02 u[IU]/mL (ref 0.35–5.50)

## 2012-07-18 ENCOUNTER — Encounter: Payer: Self-pay | Admitting: Family Medicine

## 2012-07-18 ENCOUNTER — Ambulatory Visit: Payer: Medicare Other | Admitting: Family Medicine

## 2012-07-18 ENCOUNTER — Ambulatory Visit (INDEPENDENT_AMBULATORY_CARE_PROVIDER_SITE_OTHER): Payer: Medicare Other | Admitting: Family Medicine

## 2012-07-18 VITALS — BP 118/74 | HR 99 | Temp 98.6°F | Ht 62.5 in | Wt 164.0 lb

## 2012-07-18 DIAGNOSIS — Z0000 Encounter for general adult medical examination without abnormal findings: Secondary | ICD-10-CM

## 2012-07-18 DIAGNOSIS — Z309 Encounter for contraceptive management, unspecified: Secondary | ICD-10-CM

## 2012-07-18 MED ORDER — MEDROXYPROGESTERONE ACETATE 150 MG/ML IM SUSP
150.0000 mg | Freq: Once | INTRAMUSCULAR | Status: AC
  Administered 2012-07-18: 150 mg via INTRAMUSCULAR

## 2012-07-18 NOTE — Progress Notes (Signed)
  Subjective:    Patient ID: Ashley [name], female    DOB: 1961-06-13, 51 y.o.   MRN: [id number]  HPI 51 yr old female for a cpx. She feels well and has no concerns.    Review of Systems  Constitutional: Negative.   HENT: Negative.   Eyes: Negative.   Respiratory: Negative.   Cardiovascular: Negative.   Gastrointestinal: Negative.   Genitourinary: Negative for dysuria, urgency, frequency, hematuria, flank pain, decreased urine volume, enuresis, difficulty urinating, pelvic pain and dyspareunia.  Musculoskeletal: Negative.   Skin: Negative.   Neurological: Negative.   Hematological: Negative.   Psychiatric/Behavioral: Negative.        Objective:   Physical Exam  Constitutional: She is oriented to person, place, and time. She appears well-developed and well-nourished. No distress.  HENT:  Head: Normocephalic and atraumatic.  Right Ear: External ear normal.  Left Ear: External ear normal.  Nose: Nose normal.  Mouth/Throat: Oropharynx is clear and moist. No oropharyngeal exudate.  Eyes: Conjunctivae normal and EOM are normal. Pupils are equal, round, and reactive to light. No scleral icterus.  Neck: Normal range of motion. Neck supple. No JVD present. No thyromegaly present.  Cardiovascular: Normal rate, regular rhythm, normal heart sounds and intact distal pulses.  Exam reveals no gallop and no friction rub.   No murmur heard. Pulmonary/Chest: Effort normal and breath sounds normal. No respiratory distress. She has no wheezes. She has no rales. She exhibits no tenderness.  Abdominal: Soft. Bowel sounds are normal. She exhibits no distension and no mass. There is no tenderness. There is no rebound and no guarding.  Musculoskeletal: Normal range of motion. She exhibits no edema and no tenderness.  Lymphadenopathy:    She has no cervical adenopathy.  Neurological: She is alert and oriented to person, place, and time. She has normal reflexes. No cranial nerve deficit. She exhibits  normal muscle tone. Coordination normal.  Skin: Skin is warm and dry. No rash noted. No erythema.  Psychiatric: She has a normal mood and affect. Her behavior is normal. Judgment and thought content normal.          Assessment & Plan:  Well exam.

## 2012-07-19 NOTE — Progress Notes (Signed)
Quick Note:  I left voice message with results. ______ 

## 2012-08-23 ENCOUNTER — Ambulatory Visit (INDEPENDENT_AMBULATORY_CARE_PROVIDER_SITE_OTHER): Payer: Medicare Other | Admitting: Family Medicine

## 2012-08-23 ENCOUNTER — Encounter: Payer: Self-pay | Admitting: Family Medicine

## 2012-08-23 VITALS — BP 120/80 | HR 105 | Temp 98.6°F | Wt 167.0 lb

## 2012-08-23 DIAGNOSIS — J45909 Unspecified asthma, uncomplicated: Secondary | ICD-10-CM

## 2012-08-23 DIAGNOSIS — M545 Low back pain: Secondary | ICD-10-CM

## 2012-08-23 DIAGNOSIS — I10 Essential (primary) hypertension: Secondary | ICD-10-CM

## 2012-08-23 DIAGNOSIS — F329 Major depressive disorder, single episode, unspecified: Secondary | ICD-10-CM

## 2012-08-23 DIAGNOSIS — IMO0001 Reserved for inherently not codable concepts without codable children: Secondary | ICD-10-CM

## 2012-08-23 MED ORDER — OXYCODONE HCL 5 MG PO TABA: 5.0000 mg | ORAL_TABLET | Freq: Three times a day (TID) | ORAL | Status: DC | PRN

## 2012-08-23 MED ORDER — OXYCODONE HCL 5 MG PO TABS: 5.0000 mg | ORAL_TABLET | Freq: Three times a day (TID) | ORAL | Status: DC | PRN

## 2012-08-23 MED ORDER — OXYMORPHONE HCL ER 5 MG PO TB12: ORAL_TABLET | ORAL | Status: DC

## 2012-08-23 MED ORDER — ORPHENADRINE CITRATE ER 100 MG PO TB12: 100.0000 mg | ORAL_TABLET | Freq: Two times a day (BID) | ORAL | Status: DC

## 2012-08-23 NOTE — Progress Notes (Signed)
  Subjective:    Patient ID: Ashley [name], female    DOB: [birth year], 51 y.o.   MRN: [id number]  HPI Here for med refills for her low back pain. She has been seeing Dr. Laban Emperor, and he wants her to have another radiofrequency ablation on her spine. They are fighting with Workers Comp to get this approved. She wants to change her meds to get the smallest amount of Acetominophen possible.   Review of Systems  Constitutional: Negative.   Respiratory: Negative.   Cardiovascular: Negative.   Musculoskeletal: Positive for back pain.       Objective:   Physical Exam  Constitutional: She is oriented to person, place, and time. She appears well-developed and well-nourished.  Cardiovascular: Normal rate, regular rhythm, normal heart sounds and intact distal pulses.   Pulmonary/Chest: Effort normal and breath sounds normal.  Neurological: She is alert and oriented to person, place, and time.          Assessment & Plan:  Refilled Oxymorphone, and we will switch from Norco to Oxycodone. She will follow up with Dr. Laban Emperor

## 2012-10-09 ENCOUNTER — Ambulatory Visit (INDEPENDENT_AMBULATORY_CARE_PROVIDER_SITE_OTHER): Payer: Medicare Other | Admitting: Family Medicine

## 2012-10-09 ENCOUNTER — Telehealth: Payer: Self-pay | Admitting: Family Medicine

## 2012-10-09 DIAGNOSIS — Z309 Encounter for contraceptive management, unspecified: Secondary | ICD-10-CM

## 2012-10-09 MED ORDER — MEDROXYPROGESTERONE ACETATE 150 MG/ML IM SUSP
150.0000 mg | Freq: Once | INTRAMUSCULAR | Status: AC
  Administered 2012-10-09: 150 mg via INTRAMUSCULAR

## 2012-10-09 NOTE — Telephone Encounter (Signed)
Yes, this is okay.

## 2012-10-09 NOTE — Telephone Encounter (Signed)
Pt aware.

## 2012-10-09 NOTE — Telephone Encounter (Signed)
Pt would like to come in around 2pm on April 14 for the depo injection, like she did today. Is that OK?

## 2012-11-15 ENCOUNTER — Encounter: Payer: Self-pay | Admitting: Family Medicine

## 2012-11-15 ENCOUNTER — Ambulatory Visit (INDEPENDENT_AMBULATORY_CARE_PROVIDER_SITE_OTHER): Payer: Medicare Other | Admitting: Family Medicine

## 2012-11-15 VITALS — BP 120/78 | HR 98 | Temp 98.3°F | Wt 168.0 lb

## 2012-11-15 DIAGNOSIS — M545 Low back pain: Secondary | ICD-10-CM

## 2012-11-15 DIAGNOSIS — IMO0001 Reserved for inherently not codable concepts without codable children: Secondary | ICD-10-CM

## 2012-11-15 MED ORDER — OXYCODONE HCL 5 MG PO TABS: 5.0000 mg | ORAL_TABLET | Freq: Three times a day (TID) | ORAL | Status: DC | PRN

## 2012-11-15 MED ORDER — OXYMORPHONE HCL ER 5 MG PO TB12: ORAL_TABLET | ORAL | Status: DC

## 2012-11-15 NOTE — Progress Notes (Signed)
  Subjective:    Patient ID: Ashley [name], female    DOB: June 05, 1961, 52 y.o.   MRN: [id number]  HPI Here for refills. She is doing well. She is contemplating having another radiofrequency ablation done on her back soon.    Review of Systems  Constitutional: Negative.   Musculoskeletal: Positive for myalgias and back pain.       Objective:   Physical Exam  Constitutional: She is oriented to person, place, and time. She appears well-developed and well-nourished.  Neurological: She is alert and oriented to person, place, and time.          Assessment & Plan:  Stable. Refilled meds.

## 2012-11-27 ENCOUNTER — Ambulatory Visit: Payer: Self-pay | Admitting: Pain Medicine

## 2013-01-01 ENCOUNTER — Ambulatory Visit (INDEPENDENT_AMBULATORY_CARE_PROVIDER_SITE_OTHER): Payer: Medicare Other | Admitting: Family Medicine

## 2013-01-01 DIAGNOSIS — Z309 Encounter for contraceptive management, unspecified: Secondary | ICD-10-CM

## 2013-01-01 MED ORDER — MEDROXYPROGESTERONE ACETATE 150 MG/ML IM SUSP
150.0000 mg | Freq: Once | INTRAMUSCULAR | Status: AC
  Administered 2013-01-01: 150 mg via INTRAMUSCULAR

## 2013-01-18 ENCOUNTER — Ambulatory Visit: Payer: Self-pay | Admitting: Pain Medicine

## 2013-02-14 ENCOUNTER — Ambulatory Visit (INDEPENDENT_AMBULATORY_CARE_PROVIDER_SITE_OTHER): Payer: Medicare Other | Admitting: Family Medicine

## 2013-02-14 ENCOUNTER — Encounter: Payer: Self-pay | Admitting: Family Medicine

## 2013-02-14 VITALS — BP 122/70 | HR 90 | Temp 98.4°F | Wt 166.0 lb

## 2013-02-14 DIAGNOSIS — I10 Essential (primary) hypertension: Secondary | ICD-10-CM

## 2013-02-14 DIAGNOSIS — M545 Low back pain: Secondary | ICD-10-CM

## 2013-02-14 DIAGNOSIS — IMO0001 Reserved for inherently not codable concepts without codable children: Secondary | ICD-10-CM

## 2013-02-14 MED ORDER — DICLOFENAC SODIUM 1 % TD GEL: 1.0000 "application " | Freq: Four times a day (QID) | TRANSDERMAL | Status: DC

## 2013-02-14 MED ORDER — OXYCODONE HCL 5 MG PO TABS: 5.0000 mg | ORAL_TABLET | Freq: Three times a day (TID) | ORAL | Status: DC | PRN

## 2013-02-14 MED ORDER — OXYMORPHONE HCL ER 5 MG PO TB12: ORAL_TABLET | ORAL | Status: DC

## 2013-02-14 NOTE — Progress Notes (Signed)
  Subjective:    Patient ID: Ashley [name], female    DOB: 03-01-1961, 52 y.o.   MRN: [id number]  HPI Here for refills. She is doing well. She had bilateral lumbar spine radiofrequency ablations per Dr. Laban Emperor on 01-18-13, and these have been successful.    Review of Systems  Constitutional: Negative.   Musculoskeletal: Positive for back pain.       Objective:   Physical Exam  Constitutional: She appears well-developed and well-nourished.          Assessment & Plan:  meds were refilled

## 2013-02-28 ENCOUNTER — Ambulatory Visit: Payer: Self-pay | Admitting: Pain Medicine

## 2013-03-26 ENCOUNTER — Ambulatory Visit (INDEPENDENT_AMBULATORY_CARE_PROVIDER_SITE_OTHER): Payer: Medicare Other | Admitting: Family Medicine

## 2013-03-26 DIAGNOSIS — Z309 Encounter for contraceptive management, unspecified: Secondary | ICD-10-CM

## 2013-03-26 MED ORDER — MEDROXYPROGESTERONE ACETATE 150 MG/ML IM SUSP
150.0000 mg | Freq: Once | INTRAMUSCULAR | Status: AC
  Administered 2013-03-26: 150 mg via INTRAMUSCULAR

## 2013-04-10 ENCOUNTER — Other Ambulatory Visit: Payer: Self-pay | Admitting: Family Medicine

## 2013-05-16 ENCOUNTER — Ambulatory Visit (INDEPENDENT_AMBULATORY_CARE_PROVIDER_SITE_OTHER): Payer: Worker's Compensation | Admitting: Family Medicine

## 2013-05-16 ENCOUNTER — Encounter: Payer: Self-pay | Admitting: Family Medicine

## 2013-05-16 VITALS — BP 120/80 | HR 103 | Temp 98.4°F | Wt 172.0 lb

## 2013-05-16 DIAGNOSIS — IMO0001 Reserved for inherently not codable concepts without codable children: Secondary | ICD-10-CM

## 2013-05-16 DIAGNOSIS — M545 Low back pain: Secondary | ICD-10-CM

## 2013-05-16 DIAGNOSIS — I10 Essential (primary) hypertension: Secondary | ICD-10-CM

## 2013-05-16 MED ORDER — OXYCODONE HCL 5 MG PO TABS: 5.0000 mg | ORAL_TABLET | Freq: Three times a day (TID) | ORAL | Status: DC | PRN

## 2013-05-16 MED ORDER — OXYMORPHONE HCL ER 5 MG PO TB12: ORAL_TABLET | ORAL | Status: DC

## 2013-05-16 NOTE — Progress Notes (Signed)
  Subjective:    Patient ID: Ashley [name], female    DOB: Jul 04, 1961, 52 y.o.   MRN: [id number]  HPI Here to follow up. She is doing well.    Review of Systems  Constitutional: Negative.   Musculoskeletal: Positive for back pain and arthralgias.       Objective:   Physical Exam  Constitutional: She is oriented to person, place, and time. She appears well-developed and well-nourished.  Cardiovascular: Normal rate, regular rhythm, normal heart sounds and intact distal pulses.   Pulmonary/Chest: Effort normal and breath sounds normal.  Neurological: She is alert and oriented to person, place, and time.          Assessment & Plan:  meds were refilled

## 2013-06-11 ENCOUNTER — Ambulatory Visit (INDEPENDENT_AMBULATORY_CARE_PROVIDER_SITE_OTHER): Payer: Medicare Other | Admitting: Family Medicine

## 2013-06-11 DIAGNOSIS — Z23 Encounter for immunization: Secondary | ICD-10-CM

## 2013-06-11 DIAGNOSIS — Z309 Encounter for contraceptive management, unspecified: Secondary | ICD-10-CM

## 2013-06-11 MED ORDER — MEDROXYPROGESTERONE ACETATE 150 MG/ML IM SUSP
150.0000 mg | Freq: Once | INTRAMUSCULAR | Status: AC
  Administered 2013-06-11: 150 mg via INTRAMUSCULAR

## 2013-06-12 ENCOUNTER — Other Ambulatory Visit: Payer: Self-pay

## 2013-06-12 MED ORDER — POTASSIUM CHLORIDE CRYS ER 20 MEQ PO TBCR: 20.0000 meq | EXTENDED_RELEASE_TABLET | Freq: Two times a day (BID) | ORAL | Status: DC

## 2013-07-04 ENCOUNTER — Telehealth: Payer: Self-pay | Admitting: Family Medicine

## 2013-07-04 NOTE — Telephone Encounter (Signed)
Worker's comp would like you to call them concerning pt and her meds. They need clarification on orphenadrine (NORFLEX) 100 MG tablet and  diclofenac sodium (VOLTAREN) 1 % GEL They would like to request that md do not refill these meds anymore. They would like to wean off these drugs.  Please call f/u w/ Nicki Guadalajara.

## 2013-07-05 NOTE — Telephone Encounter (Signed)
Please fax this note to [phone] stating the below message from Dr. Clent Ridges.

## 2013-07-05 NOTE — Telephone Encounter (Signed)
This page was printed, signed by Dr. Clent Ridges and I faxed to the below number.

## 2013-07-05 NOTE — Telephone Encounter (Signed)
Tell them that she still benefits from these meds and I want her to STAY on them

## 2013-07-26 ENCOUNTER — Other Ambulatory Visit: Payer: Self-pay | Admitting: Family Medicine

## 2013-08-15 ENCOUNTER — Encounter: Payer: Self-pay | Admitting: Family Medicine

## 2013-08-15 ENCOUNTER — Ambulatory Visit (INDEPENDENT_AMBULATORY_CARE_PROVIDER_SITE_OTHER): Payer: Worker's Compensation | Admitting: Family Medicine

## 2013-08-15 VITALS — BP 124/72 | HR 111 | Temp 98.8°F | Wt 173.0 lb

## 2013-08-15 DIAGNOSIS — I10 Essential (primary) hypertension: Secondary | ICD-10-CM

## 2013-08-15 DIAGNOSIS — M545 Low back pain, unspecified: Secondary | ICD-10-CM

## 2013-08-15 DIAGNOSIS — F329 Major depressive disorder, single episode, unspecified: Secondary | ICD-10-CM

## 2013-08-15 DIAGNOSIS — IMO0001 Reserved for inherently not codable concepts without codable children: Secondary | ICD-10-CM

## 2013-08-15 DIAGNOSIS — F3289 Other specified depressive episodes: Secondary | ICD-10-CM

## 2013-08-15 MED ORDER — ORPHENADRINE CITRATE ER 100 MG PO TB12: 100.0000 mg | ORAL_TABLET | Freq: Two times a day (BID) | ORAL | Status: DC

## 2013-08-15 MED ORDER — OXYCODONE HCL 5 MG PO TABS: 5.0000 mg | ORAL_TABLET | Freq: Three times a day (TID) | ORAL | Status: DC | PRN

## 2013-08-15 MED ORDER — OXYMORPHONE HCL ER 5 MG PO TB12: ORAL_TABLET | ORAL | Status: DC

## 2013-08-15 NOTE — Progress Notes (Signed)
   Subjective:    Patient ID: Ashley [name], female    DOB: 1960/12/03, 52 y.o.   MRN: [id number]  HPI Here for follow up of chronic pain syndromes resulting from a Workers Comp injury on 09-23-05. She had seen several pain management specialists but now is seeing [place] for medication management. Her Worker Comp case remains open, and she is accompanied by her case worker, Therapist, music. Ashley [name] still deals with daily low back pain and pain in the left arm, and she feels that she is not able to work at all. Her last functional capacities exam was about 4-5 years ago. Her case worker is questioning today whether Ashley [name] is able to return to work in any capacity or if she can come off some of her meds. Ashley [name] notes today that since the weather has turned cooler the last few weeks her fibromyalgia aches and her low back pain has been worse than usual.    Review of Systems  Constitutional: Negative.   Respiratory: Negative.   Cardiovascular: Negative.   Musculoskeletal: Positive for back pain and myalgias.       Objective:   Physical Exam  Constitutional: She is oriented to person, place, and time. She appears well-developed and well-nourished.  Cardiovascular: Normal rate, regular rhythm, normal heart sounds and intact distal pulses.   Pulmonary/Chest: Effort normal and breath sounds normal.  Neurological: She is alert and oriented to person, place, and time.          Assessment & Plan:  Her meds were refilled. She will go to the lab today for urine drug testing and she will sign a pain management contract with [place]. I suggested she have another FCE to give US objective data about what her work capacities really are at this point, and we all agreed. Her case worker will set this up soon.

## 2013-08-15 NOTE — Progress Notes (Signed)
Pre visit review using our clinic review tool, if applicable. No additional management support is needed unless otherwise documented below in the visit note. 

## 2013-09-05 ENCOUNTER — Other Ambulatory Visit: Payer: Self-pay

## 2013-09-05 MED ORDER — POTASSIUM CHLORIDE CRYS ER 20 MEQ PO TBCR: EXTENDED_RELEASE_TABLET | ORAL | Status: DC

## 2013-09-05 MED ORDER — OXYBUTYNIN CHLORIDE 5 MG PO TABS: 5.0000 mg | ORAL_TABLET | Freq: Two times a day (BID) | ORAL | Status: DC

## 2013-09-05 NOTE — Telephone Encounter (Signed)
Rx sent to pharmacy for 90 day supply.  

## 2013-09-06 ENCOUNTER — Ambulatory Visit (INDEPENDENT_AMBULATORY_CARE_PROVIDER_SITE_OTHER): Payer: Medicare Other | Admitting: Family Medicine

## 2013-09-06 DIAGNOSIS — Z309 Encounter for contraceptive management, unspecified: Secondary | ICD-10-CM

## 2013-09-06 MED ORDER — MEDROXYPROGESTERONE ACETATE 150 MG/ML IM SUSP
150.0000 mg | Freq: Once | INTRAMUSCULAR | Status: AC
  Administered 2013-09-06: 150 mg via INTRAMUSCULAR

## 2013-09-07 ENCOUNTER — Encounter: Payer: Self-pay | Admitting: Family Medicine

## 2013-09-26 ENCOUNTER — Telehealth: Payer: Self-pay | Admitting: Family Medicine

## 2013-09-26 NOTE — Telephone Encounter (Signed)
I spoke to customer service at [facility] and I was advised that the claim's adjuster, Carolynn Serve is denying any medication re-fills until they receive updated progress notes on patient.  The general fax number for [place] claims is, [phone] and the fax should have the claim's number [id number] on it.  The pt's claim's adjuster is Carolynn Serve and her number is [phone] and fax is [phone].

## 2013-09-26 NOTE — Telephone Encounter (Signed)
The last office note was printed and faxed to the below number.

## 2013-10-29 ENCOUNTER — Encounter: Payer: Self-pay | Admitting: Family Medicine

## 2013-10-29 ENCOUNTER — Ambulatory Visit (INDEPENDENT_AMBULATORY_CARE_PROVIDER_SITE_OTHER): Payer: Medicare Other | Admitting: Family Medicine

## 2013-10-29 VITALS — BP 128/86 | HR 118 | Temp 100.3°F | Ht 62.5 in | Wt 175.0 lb

## 2013-10-29 DIAGNOSIS — J209 Acute bronchitis, unspecified: Secondary | ICD-10-CM

## 2013-10-29 MED ORDER — HYDROCODONE-HOMATROPINE 5-1.5 MG/5ML PO SYRP: 5.0000 mL | ORAL_SOLUTION | ORAL | Status: DC | PRN

## 2013-10-29 MED ORDER — AMOXICILLIN-POT CLAVULANATE 875-125 MG PO TABS: 1.0000 | ORAL_TABLET | Freq: Two times a day (BID) | ORAL | Status: DC

## 2013-10-29 NOTE — Progress Notes (Signed)
Pre visit review using our clinic review tool, if applicable. No additional management support is needed unless otherwise documented below in the visit note. 

## 2013-10-29 NOTE — Progress Notes (Signed)
   Subjective:    Patient ID: Ashley [name], female    DOB: [birth year], 53 y.o.   MRN: [id number]  HPI Here for 3 days of fever, PND, chest congestion and a dry cough.    Review of Systems  Constitutional: Positive for fever.  HENT: Positive for congestion and postnasal drip.   Eyes: Negative.   Respiratory: Positive for cough.        Objective:   Physical Exam  Constitutional: She appears well-developed and well-nourished.  HENT:  Right Ear: External ear normal.  Left Ear: External ear normal.  Nose: Nose normal.  Mouth/Throat: Oropharynx is clear and moist.  Eyes: Conjunctivae are normal.  Pulmonary/Chest: Effort normal and breath sounds normal.  Lymphadenopathy:    She has no cervical adenopathy.          Assessment & Plan:  Add Mucinex

## 2013-11-01 ENCOUNTER — Telehealth: Payer: Self-pay | Admitting: Family Medicine

## 2013-11-01 NOTE — Telephone Encounter (Signed)
2nd call, pt stating that she is worse than when seen in office.  HA, Sinus pain and pressure, coughing, Pain from coughing, low grade fever, Sore throat.  Pt has been taking medication that was rx.  Pt is currently taking Augmentin.  Pt reports that she has just continued to get worse.  Pt states that she is eating and drinking well.

## 2013-11-01 NOTE — Telephone Encounter (Signed)
Pt was seen on 10-29-13 for bronchitis. Pt is no better. Pt is calling to see what md would like her to do. Pt does not want another appt

## 2013-11-02 ENCOUNTER — Ambulatory Visit: Payer: Self-pay | Admitting: Family Medicine

## 2013-11-02 ENCOUNTER — Telehealth: Payer: Self-pay | Admitting: Family Medicine

## 2013-11-02 MED ORDER — METHYLPREDNISOLONE 4 MG PO KIT: PACK | ORAL | Status: DC

## 2013-11-02 MED ORDER — AMOXICILLIN-POT CLAVULANATE 875-125 MG PO TABS: 1.0000 | ORAL_TABLET | Freq: Two times a day (BID) | ORAL | Status: DC

## 2013-11-02 MED ORDER — HYDROCODONE-HOMATROPINE 5-1.5 MG/5ML PO SYRP: 5.0000 mL | ORAL_SOLUTION | ORAL | Status: DC | PRN

## 2013-11-02 MED ORDER — NYSTATIN 100000 UNIT/ML MT SUSP: 5.0000 mL | Freq: Four times a day (QID) | OROMUCOSAL | Status: DC

## 2013-11-02 MED ORDER — CLARITHROMYCIN 500 MG PO TABS: 500.0000 mg | ORAL_TABLET | Freq: Two times a day (BID) | ORAL | Status: DC

## 2013-11-02 NOTE — Telephone Encounter (Signed)
Chris/Spouse Phone [phone] called to request appointment for follow up appointment for wife who has bronchitis.  MD instructed to call or be seen by 11/02/13, if symptoms not improving. Reports no one returned her call 11/01/13.   Declined triage.  Scheduled for 1330 11/02/13 with Dr Sarajane Jews per caller request.

## 2013-11-02 NOTE — Telephone Encounter (Signed)
I did send both scripts e-scribe and spoke with pt. She will need a refill on cough syrup also.

## 2013-11-02 NOTE — Telephone Encounter (Signed)
She has thrush in the mouth so we will treat with Nystatin

## 2013-11-02 NOTE — Telephone Encounter (Signed)
Stop the Augmentin and call in Biaxin 500 mg bid for 10 days. Also call in a Medrol Dose Pack. She already has cough syrup to use.

## 2013-11-13 ENCOUNTER — Encounter: Payer: Self-pay | Admitting: Family Medicine

## 2013-11-13 ENCOUNTER — Ambulatory Visit (INDEPENDENT_AMBULATORY_CARE_PROVIDER_SITE_OTHER): Payer: Worker's Compensation | Admitting: Family Medicine

## 2013-11-13 VITALS — BP 120/80 | HR 111 | Temp 99.5°F | Ht 62.5 in | Wt 175.0 lb

## 2013-11-13 DIAGNOSIS — M545 Low back pain, unspecified: Secondary | ICD-10-CM

## 2013-11-13 DIAGNOSIS — IMO0001 Reserved for inherently not codable concepts without codable children: Secondary | ICD-10-CM

## 2013-11-13 MED ORDER — OXYCODONE HCL 5 MG PO TABS: 5.0000 mg | ORAL_TABLET | Freq: Three times a day (TID) | ORAL | Status: DC | PRN

## 2013-11-13 MED ORDER — OXYMORPHONE HCL ER 5 MG PO TB12: ORAL_TABLET | ORAL | Status: DC

## 2013-11-13 NOTE — Progress Notes (Signed)
   Subjective:    Patient ID: Ashley [name], female    DOB: 1961/08/18, 53 y.o.   MRN: [id number]  HPI Here for med refills. She is doing well. She has recovered from the bronchitis we saw her for a few weeks ago.    Review of Systems  Constitutional: Negative.   Respiratory: Negative.   Musculoskeletal: Positive for arthralgias and back pain.       Objective:   Physical Exam  Constitutional: She appears well-developed.  Cardiovascular: Normal rate, regular rhythm, normal heart sounds and intact distal pulses.   Pulmonary/Chest: Effort normal and breath sounds normal. No respiratory distress. She has no wheezes. She has no rales. She exhibits no tenderness.          Assessment & Plan:  Refilled meds.

## 2013-11-13 NOTE — Progress Notes (Signed)
Pre visit review using our clinic review tool, if applicable. No additional management support is needed unless otherwise documented below in the visit note. 

## 2013-11-14 ENCOUNTER — Telehealth: Payer: Self-pay | Admitting: Family Medicine

## 2013-11-14 MED ORDER — FLUCONAZOLE 150 MG PO TABS: 150.0000 mg | ORAL_TABLET | Freq: Once | ORAL | Status: DC

## 2013-11-14 NOTE — Telephone Encounter (Signed)
Pt is needing new rx fluconazole for yeast infection. Pt states she started her antibiotic on last night and has yeast infection already, send to [facility].

## 2013-11-14 NOTE — Telephone Encounter (Signed)
I sent script e-scribe and left a voice message for pt. 

## 2013-11-14 NOTE — Telephone Encounter (Signed)
Call in Diflucan 150 mg #1 with 11 rf  

## 2013-11-30 ENCOUNTER — Ambulatory Visit (INDEPENDENT_AMBULATORY_CARE_PROVIDER_SITE_OTHER): Payer: Medicare Other | Admitting: Family Medicine

## 2013-11-30 DIAGNOSIS — Z309 Encounter for contraceptive management, unspecified: Secondary | ICD-10-CM

## 2013-11-30 MED ORDER — MEDROXYPROGESTERONE ACETATE 150 MG/ML IM SUSP
150.0000 mg | Freq: Once | INTRAMUSCULAR | Status: AC
  Administered 2013-11-30: 150 mg via INTRAMUSCULAR

## 2014-02-12 ENCOUNTER — Encounter: Payer: Self-pay | Admitting: Family Medicine

## 2014-02-12 ENCOUNTER — Ambulatory Visit (INDEPENDENT_AMBULATORY_CARE_PROVIDER_SITE_OTHER): Payer: Medicare Other | Admitting: Family Medicine

## 2014-02-12 VITALS — BP 123/93 | HR 94 | Temp 98.4°F | Ht 62.5 in | Wt 182.0 lb

## 2014-02-12 DIAGNOSIS — M545 Low back pain, unspecified: Secondary | ICD-10-CM

## 2014-02-12 DIAGNOSIS — IMO0001 Reserved for inherently not codable concepts without codable children: Secondary | ICD-10-CM

## 2014-02-12 DIAGNOSIS — I10 Essential (primary) hypertension: Secondary | ICD-10-CM

## 2014-02-12 MED ORDER — OXYMORPHONE HCL ER 5 MG PO TB12: ORAL_TABLET | ORAL | Status: DC

## 2014-02-12 MED ORDER — OXYCODONE HCL 5 MG PO TABS
5.0000 mg | ORAL_TABLET | Freq: Three times a day (TID) | ORAL | Status: DC | PRN
Start: 2014-02-12 — End: 2014-05-22

## 2014-02-12 MED ORDER — OXYCODONE HCL 5 MG PO TABS: 5.0000 mg | ORAL_TABLET | Freq: Three times a day (TID) | ORAL | Status: DC | PRN

## 2014-02-12 MED ORDER — ORPHENADRINE CITRATE ER 100 MG PO TB12: 100.0000 mg | ORAL_TABLET | Freq: Two times a day (BID) | ORAL | Status: DC

## 2014-02-12 MED ORDER — OXYCODONE HCL 5 MG PO TABS
5.0000 mg | ORAL_TABLET | Freq: Three times a day (TID) | ORAL | Status: DC | PRN
Start: 2014-02-12 — End: 2014-02-12

## 2014-02-12 MED ORDER — DICLOFENAC SODIUM 1 % TD GEL: 1.0000 "application " | Freq: Four times a day (QID) | TRANSDERMAL | Status: DC

## 2014-02-12 NOTE — Progress Notes (Signed)
   Subjective:    Patient ID: Ashley [name], female    DOB: 1961-08-24, 53 y.o.   MRN: [id number]  HPI Here to follow up. She is doing well. She has gained a little weight and this puts some stress on her back.    Review of Systems  Constitutional: Negative.   Musculoskeletal: Positive for back pain and myalgias.       Objective:   Physical Exam  Constitutional: She appears well-developed and well-nourished.  Musculoskeletal: Normal range of motion. She exhibits no edema and no tenderness.          Assessment & Plan:  Refilled meds. She plans to rejoin [employer] and to start her daily walks again.

## 2014-02-12 NOTE — Progress Notes (Signed)
Pre visit review using our clinic review tool, if applicable. No additional management support is needed unless otherwise documented below in the visit note. 

## 2014-05-09 ENCOUNTER — Other Ambulatory Visit: Payer: Self-pay | Admitting: Family Medicine

## 2014-05-22 ENCOUNTER — Encounter: Payer: Self-pay | Admitting: Family Medicine

## 2014-05-22 ENCOUNTER — Ambulatory Visit (INDEPENDENT_AMBULATORY_CARE_PROVIDER_SITE_OTHER): Payer: Medicare Other | Admitting: Family Medicine

## 2014-05-22 VITALS — BP 118/88 | HR 99 | Temp 98.9°F | Ht 62.5 in | Wt 166.0 lb

## 2014-05-22 DIAGNOSIS — I10 Essential (primary) hypertension: Secondary | ICD-10-CM

## 2014-05-22 DIAGNOSIS — R51 Headache: Secondary | ICD-10-CM

## 2014-05-22 DIAGNOSIS — M545 Low back pain, unspecified: Secondary | ICD-10-CM

## 2014-05-22 DIAGNOSIS — IMO0001 Reserved for inherently not codable concepts without codable children: Secondary | ICD-10-CM

## 2014-05-22 MED ORDER — OXYCODONE HCL 5 MG PO TABS: 5.0000 mg | ORAL_TABLET | Freq: Three times a day (TID) | ORAL | Status: DC | PRN

## 2014-05-22 MED ORDER — OXYMORPHONE HCL ER 5 MG PO TB12: ORAL_TABLET | ORAL | Status: DC

## 2014-05-22 MED ORDER — ORPHENADRINE CITRATE ER 100 MG PO TB12: 100.0000 mg | ORAL_TABLET | Freq: Two times a day (BID) | ORAL | Status: DC

## 2014-05-22 NOTE — Progress Notes (Signed)
Pre visit review using our clinic review tool, if applicable. No additional management support is needed unless otherwise documented below in the visit note. 

## 2014-05-22 NOTE — Progress Notes (Signed)
   Subjective:    Patient ID: Ashley [name], female    DOB: 1960/12/16, 53 y.o.   MRN: [id number]  HPI Here for refills. She is doing fairly well. She has good and bad days as far as pain levels go. Her BP is stable. She has joined [employer] again and she has lost 16 lbs from her last visit here.    Review of Systems  Constitutional: Negative.   Respiratory: Negative.   Cardiovascular: Negative.   Musculoskeletal: Positive for back pain and myalgias.  Neurological: Negative.        Objective:   Physical Exam  Constitutional: She is oriented to person, place, and time. She appears well-developed and well-nourished.  Cardiovascular: Normal rate, regular rhythm, normal heart sounds and intact distal pulses.   Pulmonary/Chest: Effort normal and breath sounds normal.  Neurological: She is alert and oriented to person, place, and time.          Assessment & Plan:  Refilled meds. She will set up a cpx with fasting labs soon.

## 2014-06-18 ENCOUNTER — Other Ambulatory Visit (INDEPENDENT_AMBULATORY_CARE_PROVIDER_SITE_OTHER): Payer: Medicare Other

## 2014-06-18 DIAGNOSIS — Z0000 Encounter for general adult medical examination without abnormal findings: Secondary | ICD-10-CM

## 2014-06-18 LAB — CBC WITH DIFFERENTIAL/PLATELET
BASOS PCT: 0.3 % (ref 0.0–3.0)
Basophils Absolute: 0 10*3/uL (ref 0.0–0.1)
Eosinophils Absolute: 0.1 10*3/uL (ref 0.0–0.7)
Eosinophils Relative: 1.6 % (ref 0.0–5.0)
HCT: 42.2 % (ref 36.0–46.0)
Hemoglobin: 14.5 g/dL (ref 12.0–15.0)
LYMPHS ABS: 2.3 10*3/uL (ref 0.7–4.0)
Lymphocytes Relative: 35 % (ref 12.0–46.0)
MCHC: 34.3 g/dL (ref 30.0–36.0)
MCV: 92.5 fl (ref 78.0–100.0)
MONO ABS: 0.6 10*3/uL (ref 0.1–1.0)
Monocytes Relative: 9 % (ref 3.0–12.0)
Neutro Abs: 3.6 10*3/uL (ref 1.4–7.7)
Neutrophils Relative %: 54.1 % (ref 43.0–77.0)
PLATELETS: 250 10*3/uL (ref 150.0–400.0)
RBC: 4.57 Mil/uL (ref 3.87–5.11)
RDW: 12.4 % (ref 11.5–15.5)
WBC: 6.6 10*3/uL (ref 4.0–10.5)

## 2014-06-18 LAB — POCT URINALYSIS DIPSTICK
Bilirubin, UA: NEGATIVE
Glucose, UA: NEGATIVE
Ketones, UA: NEGATIVE
Leukocytes, UA: NEGATIVE
Nitrite, UA: NEGATIVE
PROTEIN UA: NEGATIVE
SPEC GRAV UA: 1.015
Urobilinogen, UA: 0.2
pH, UA: 6.5

## 2014-06-18 LAB — BASIC METABOLIC PANEL
BUN: 12 mg/dL (ref 6–23)
CALCIUM: 9.5 mg/dL (ref 8.4–10.5)
CO2: 24 mEq/L (ref 19–32)
Chloride: 106 mEq/L (ref 96–112)
Creatinine, Ser: 0.9 mg/dL (ref 0.4–1.2)
GFR: 71.93 mL/min (ref >60.00)
GLUCOSE: 84 mg/dL (ref 70–99)
Potassium: 4.6 mEq/L (ref 3.5–5.1)
SODIUM: 140 meq/L (ref 135–145)

## 2014-06-18 LAB — LIPID PANEL
CHOL/HDL RATIO: 4
Cholesterol: 187 mg/dL (ref 0–200)
HDL: 44.2 mg/dL (ref >39.00)
LDL Cholesterol: 103 mg/dL — ABNORMAL HIGH (ref 0–99)
NONHDL: 142.8
Triglycerides: 200 mg/dL — ABNORMAL HIGH (ref 0.0–149.0)
VLDL: 40 mg/dL (ref 0.0–40.0)

## 2014-06-18 LAB — HEPATIC FUNCTION PANEL
ALT: 26 U/L (ref 0–35)
AST: 31 U/L (ref 0–37)
Albumin: 4.1 g/dL (ref 3.5–5.2)
Alkaline Phosphatase: 99 U/L (ref 39–117)
Bilirubin, Direct: 0.1 mg/dL (ref 0.0–0.3)
Total Bilirubin: 0.6 mg/dL (ref 0.2–1.2)
Total Protein: 6.9 g/dL (ref 6.0–8.3)

## 2014-06-18 LAB — TSH: TSH: 0.73 u[IU]/mL (ref 0.35–4.50)

## 2014-06-19 ENCOUNTER — Other Ambulatory Visit: Payer: Self-pay

## 2014-06-26 ENCOUNTER — Encounter: Payer: Self-pay | Admitting: Family Medicine

## 2014-06-26 ENCOUNTER — Ambulatory Visit (INDEPENDENT_AMBULATORY_CARE_PROVIDER_SITE_OTHER): Payer: Medicare Other | Admitting: Family Medicine

## 2014-06-26 VITALS — BP 105/87 | HR 89 | Temp 98.6°F | Ht 62.5 in | Wt 166.0 lb

## 2014-06-26 DIAGNOSIS — Z0000 Encounter for general adult medical examination without abnormal findings: Secondary | ICD-10-CM

## 2014-06-26 DIAGNOSIS — Z23 Encounter for immunization: Secondary | ICD-10-CM

## 2014-06-26 MED ORDER — SOLIFENACIN SUCCINATE 10 MG PO TABS: 10.0000 mg | ORAL_TABLET | Freq: Every day | ORAL | Status: DC

## 2014-06-26 MED ORDER — SUMATRIPTAN SUCCINATE 100 MG PO TABS: 100.0000 mg | ORAL_TABLET | ORAL | Status: DC | PRN

## 2014-06-26 NOTE — Addendum Note (Signed)
Addended by: Aggie Hacker A on: 06/26/2014 04:18 PM   Modules accepted: Orders

## 2014-06-26 NOTE — Progress Notes (Signed)
   Subjective:    Patient ID: Ashley [name], female    DOB: 1961-08-30, 53 y.o.   MRN: [id number]  HPI 53 yr old female for a cpx. She feels well in general other than her typical aches and pains. She still struggles with urinary urgency and Oxybutynin does not seem to work as well as it used to. She was due for a follow up colonoscopy last year with Dr. Earlean Shawl but has not set this up yet.    Review of Systems  Constitutional: Negative.   HENT: Negative.   Eyes: Negative.   Respiratory: Negative.   Cardiovascular: Negative.   Gastrointestinal: Negative.   Genitourinary: Negative for dysuria, urgency, frequency, hematuria, flank pain, decreased urine volume, enuresis, difficulty urinating, pelvic pain and dyspareunia.  Musculoskeletal: Negative.   Skin: Negative.   Neurological: Negative.   Psychiatric/Behavioral: Negative.        Objective:   Physical Exam  Constitutional: She is oriented to person, place, and time. She appears well-developed and well-nourished. No distress.  HENT:  Head: Normocephalic and atraumatic.  Right Ear: External ear normal.  Left Ear: External ear normal.  Nose: Nose normal.  Mouth/Throat: Oropharynx is clear and moist. No oropharyngeal exudate.  Eyes: Conjunctivae and EOM are normal. Pupils are equal, round, and reactive to light. No scleral icterus.  Neck: Normal range of motion. Neck supple. No JVD present. No thyromegaly present.  Cardiovascular: Normal rate, regular rhythm, normal heart sounds and intact distal pulses.  Exam reveals no gallop and no friction rub.   No murmur heard. EKG normal   Pulmonary/Chest: Effort normal and breath sounds normal. No respiratory distress. She has no wheezes. She has no rales. She exhibits no tenderness.  Abdominal: Soft. Bowel sounds are normal. She exhibits no distension and no mass. There is no tenderness. There is no rebound and no guarding.  Musculoskeletal: Normal range of motion. She exhibits no edema and  no tenderness.  Lymphadenopathy:    She has no cervical adenopathy.  Neurological: She is alert and oriented to person, place, and time. She has normal reflexes. No cranial nerve deficit. She exhibits normal muscle tone. Coordination normal.  Skin: Skin is warm and dry. No rash noted. No erythema.  Psychiatric: She has a normal mood and affect. Her behavior is normal. Judgment and thought content normal.          Assessment & Plan:  Well exam. Try Vesicare 10 mg daily.

## 2014-06-26 NOTE — Progress Notes (Signed)
Pre visit review using our clinic review tool, if applicable. No additional management support is needed unless otherwise documented below in the visit note. 

## 2014-07-01 ENCOUNTER — Telehealth: Payer: Self-pay | Admitting: Family Medicine

## 2014-07-01 NOTE — Telephone Encounter (Signed)
Pt is requesting a copy of the rx for her lawyer  diclofenac sodium (VOLTAREN) 1 % GEL said her husband will be in tomorrow and would like for him to pick it up

## 2014-07-02 NOTE — Telephone Encounter (Signed)
A copy of last script was printed and I left a voice message for pt.

## 2014-08-02 ENCOUNTER — Encounter: Payer: Self-pay | Admitting: Physician Assistant

## 2014-08-02 ENCOUNTER — Ambulatory Visit (INDEPENDENT_AMBULATORY_CARE_PROVIDER_SITE_OTHER): Payer: Medicare Other | Admitting: Physician Assistant

## 2014-08-02 VITALS — BP 123/89 | HR 100 | Temp 98.2°F | Resp 16 | Ht 62.5 in | Wt 168.4 lb

## 2014-08-02 DIAGNOSIS — J019 Acute sinusitis, unspecified: Secondary | ICD-10-CM | POA: Insufficient documentation

## 2014-08-02 DIAGNOSIS — B9689 Other specified bacterial agents as the cause of diseases classified elsewhere: Secondary | ICD-10-CM | POA: Insufficient documentation

## 2014-08-02 MED ORDER — AMOXICILLIN-POT CLAVULANATE 875-125 MG PO TABS: 1.0000 | ORAL_TABLET | Freq: Two times a day (BID) | ORAL | Status: DC

## 2014-08-02 MED ORDER — FLUTICASONE PROPIONATE 50 MCG/ACT NA SUSP: 2.0000 | Freq: Every day | NASAL | Status: DC

## 2014-08-02 MED ORDER — HYDROCODONE-HOMATROPINE 5-1.5 MG/5ML PO SYRP: 5.0000 mL | ORAL_SOLUTION | Freq: Three times a day (TID) | ORAL | Status: DC | PRN

## 2014-08-02 NOTE — Patient Instructions (Signed)
Please take antibiotic as directed.  Increase fluid intake.  Use Saline nasal spray.  Take a daily multivitamin. Flonase and Claritin daily.  Place a humidifier in the bedroom.  Please call or return clinic if symptoms are not improving.  Sinusitis Sinusitis is redness, soreness, and swelling (inflammation) of the paranasal sinuses. Paranasal sinuses are air pockets within the bones of your face (beneath the eyes, the middle of the forehead, or above the eyes). In healthy paranasal sinuses, mucus is able to drain out, and air is able to circulate through them by way of your nose. However, when your paranasal sinuses are inflamed, mucus and air can become trapped. This can allow bacteria and other germs to grow and cause infection. Sinusitis can develop quickly and last only a short time (acute) or continue over a long period (chronic). Sinusitis that lasts for more than 12 weeks is considered chronic.  CAUSES  Causes of sinusitis include:  Allergies.  Structural abnormalities, such as displacement of the cartilage that separates your nostrils (deviated septum), which can decrease the air flow through your nose and sinuses and affect sinus drainage.  Functional abnormalities, such as when the small hairs (cilia) that line your sinuses and help remove mucus do not work properly or are not present. SYMPTOMS  Symptoms of acute and chronic sinusitis are the same. The primary symptoms are pain and pressure around the affected sinuses. Other symptoms include:  Upper toothache.  Earache.  Headache.  Bad breath.  Decreased sense of smell and taste.  A cough, which worsens when you are lying flat.  Fatigue.  Fever.  Thick drainage from your nose, which often is green and may contain pus (purulent).  Swelling and warmth over the affected sinuses. DIAGNOSIS  Your caregiver will perform a physical exam. During the exam, your caregiver may:  Look in your nose for signs of abnormal growths in  your nostrils (nasal polyps).  Tap over the affected sinus to check for signs of infection.  View the inside of your sinuses (endoscopy) with a special imaging device with a light attached (endoscope), which is inserted into your sinuses. If your caregiver suspects that you have chronic sinusitis, one or more of the following tests may be recommended:  Allergy tests.  Nasal culture A sample of mucus is taken from your nose and sent to a lab and screened for bacteria.  Nasal cytology A sample of mucus is taken from your nose and examined by your caregiver to determine if your sinusitis is related to an allergy. TREATMENT  Most cases of acute sinusitis are related to a viral infection and will resolve on their own within 10 days. Sometimes medicines are prescribed to help relieve symptoms (pain medicine, decongestants, nasal steroid sprays, or saline sprays).  However, for sinusitis related to a bacterial infection, your caregiver will prescribe antibiotic medicines. These are medicines that will help kill the bacteria causing the infection.  Rarely, sinusitis is caused by a fungal infection. In theses cases, your caregiver will prescribe antifungal medicine. For some cases of chronic sinusitis, surgery is needed. Generally, these are cases in which sinusitis recurs more than 3 times per year, despite other treatments. HOME CARE INSTRUCTIONS   Drink plenty of water. Water helps thin the mucus so your sinuses can drain more easily.  Use a humidifier.  Inhale steam 3 to 4 times a day (for example, sit in the bathroom with the shower running).  Apply a warm, moist washcloth to your face 3 to  4 times a day, or as directed by your caregiver.  Use saline nasal sprays to help moisten and clean your sinuses.  Take over-the-counter or prescription medicines for pain, discomfort, or fever only as directed by your caregiver. SEEK IMMEDIATE MEDICAL CARE IF:  You have increasing pain or severe  headaches.  You have nausea, vomiting, or drowsiness.  You have swelling around your face.  You have vision problems.  You have a stiff neck.  You have difficulty breathing. MAKE SURE YOU:   Understand these instructions.  Will watch your condition.  Will get help right away if you are not doing well or get worse. Document Released: 09/06/2005 Document Revised: 11/29/2011 Document Reviewed: 09/21/2011 [facility] Patient Information 2014 [place], [place].

## 2014-08-02 NOTE — Progress Notes (Signed)
History of Present Illness: Ashley [name] is a 53 y.o. female who present to the clinic today complaining of sinus pressure, sinus pain and nasal congestion. Patient endorses L ear pain and productive cough.  Patient denies fever, chills, aches, recent travel or sick contact.  Tried OTC medications without relief of symptoms.  Symptoms have rapidly worsened over the past 4 days.   History: Past Medical History  Diagnosis Date  . Asthma   . Depression   . Headache(784.0)   . Allergy   . GERD (gastroesophageal reflux disease)   . IBS (irritable bowel syndrome)     dr Watt Climes  . Reflex sympathetic dystrophy     left hand dr Annie Main and dr Nelva Bush  . Bipolar disorder     dr Shirlee More   . Fibromyalgia     dr Milinda Pointer [place]  . HTN (hypertension)   . Chronic lower back pain     sees Dr. Milinda Pointer at [facility]   . Injury of lower back 09-23-05    Workers Comp case worker is Michelene Gardener (phone [phone] or fax [phone])   Current outpatient prescriptions: acyclovir (ZOVIRAX) 400 MG tablet, Take 400 mg by mouth daily.  , Disp: , Rfl: ;  alprazolam (XANAX) 2 MG tablet, Take 1 tablet (2 mg total) by mouth 3 (three) times daily as needed., Disp: 90 tablet, Rfl: 5;  BuPROPion HCl (WELLBUTRIN PO), Take 150 each by mouth 2 (two) times daily.  , Disp: , Rfl: ;  calcium-vitamin D (OSCAL) 250-125 MG-UNIT per tablet, Take 1 tablet by mouth daily.  , Disp: , Rfl:  diclofenac sodium (VOLTAREN) 1 % GEL, Apply 1 application topically 4 (four) times daily., Disp: 100 g, Rfl: 11;  fish oil-omega-3 fatty acids 1000 MG capsule, Take 2 g by mouth daily.  , Disp: , Rfl: ;  KLOR-CON M20 20 MEQ tablet, TAKE 1 TABLET (20 MEQ TOTAL) BY MOUTH 2 (TWO) TIMES DAILY., Disp: 180 tablet, Rfl: 1;  metoCLOPramide (REGLAN) 5 MG tablet, Take 5 mg by mouth 3 (three) times daily before meals. , Disp: , Rfl:  Multiple Vitamin (MULTIVITAMIN) capsule, Take 1 capsule by mouth  daily.  , Disp: , Rfl: ;  nystatin (MYCOSTATIN) 100000 UNIT/ML suspension, Take 5 mLs (500,000 Units total) by mouth 4 (four) times daily., Disp: 240 mL, Rfl: 0;  omeprazole (PRILOSEC) 20 MG capsule, Take 20 mg by mouth daily.  , Disp: , Rfl: ;  orphenadrine (NORFLEX) 100 MG tablet, Take 1 tablet (100 mg total) by mouth 2 (two) times daily., Disp: 180 tablet, Rfl: 3 oxybutynin (DITROPAN) 5 MG tablet, Take 5 mg by mouth 2 (two) times daily., Disp: , Rfl: ;  oxyCODONE (OXY IR/ROXICODONE) 5 MG immediate release tablet, Take 1 tablet (5 mg total) by mouth every 8 (eight) hours as needed for severe pain., Disp: 90 tablet, Rfl: 0;  oxymorphone (OPANA ER) 5 MG 12 hr tablet, Takes 3 tabs two times daily, Disp: 180 tablet, Rfl: 0;  risperiDONE (RISPERDAL) 2 MG tablet, Take 2 mg by mouth daily.  , Disp: , Rfl:  SUMAtriptan (IMITREX) 100 MG tablet, Take 1 tablet (100 mg total) by mouth as needed for migraine., Disp: 10 tablet, Rfl: 11;  amoxicillin-clavulanate (AUGMENTIN) 875-125 MG per tablet, Take 1 tablet by mouth 2 (two) times daily., Disp: 20 tablet, Rfl: 0;  fluticasone (FLONASE) 50 MCG/ACT nasal spray, Place 2 sprays into both nostrils daily., Disp: 16 g, Rfl: 6 HYDROcodone-homatropine (HYCODAN) 5-1.5 MG/5ML  syrup, Take 5 mLs by mouth every 8 (eight) hours as needed for cough., Disp: 120 mL, Rfl: 0 Allergies  Allergen Reactions  . Aspirin   . Ciprofloxacin   . Gabapentin   . Levetiracetam   . Tramadol-Acetaminophen    No family history on file. History   Social History  . Marital Status: Married    Spouse Name: N/A    Number of Children: N/A  . Years of Education: N/A   Social History Main Topics  . Smoking status: Never Smoker   . Smokeless tobacco: Never Used  . Alcohol Use: 1.0 oz/week    2 drink(s) per week  . Drug Use: No  . Sexual Activity: None   Other Topics Concern  . None   Social History Narrative    Review of Systems: See HPI.  All other ROS are negative.  Physical  Examination: BP 123/89 mmHg  Pulse 100  Temp(Src) 98.2 F (36.8 C) (Oral)  Resp 16  Ht 5' 2.5" (1.588 m)  Wt 168 lb 6 oz (76.374 kg)  BMI 30.29 kg/m2  SpO2 99%  General appearance: alert, cooperative and appears stated age Head: Normocephalic, without obvious abnormality, atraumatic, sinuses tender to percussion Eyes: conjunctivae/corneas clear. PERRL, EOM's intact. Fundi benign. Ears: normal TM's and external ear canals both ears Nose: moderate congestion, turbinates swollen, sinus tenderness left Throat: lips, mucosa, and tongue normal; teeth and gums normal Neck: no adenopathy, no carotid bruit, no JVD, supple, symmetrical, trachea midline and thyroid not enlarged, symmetric, no tenderness/mass/nodules Lungs: clear to auscultation bilaterally Chest wall: no tenderness  Assessment/Plan: Acute bacterial sinusitis Rx Augmentin.  Increase fluid intake.  Rest.  Saline nasal spray. Probiotic. Begin Flonase.  Rx Hydromet for cough. Please a humidifier in the bedroom.  Call or return to clinic if symptoms are not improving.

## 2014-08-02 NOTE — Assessment & Plan Note (Signed)
Rx Augmentin.  Increase fluid intake.  Rest.  Saline nasal spray. Probiotic. Begin Flonase.  Rx Hydromet for cough. Please a humidifier in the bedroom.  Call or return to clinic if symptoms are not improving.

## 2014-08-02 NOTE — Progress Notes (Signed)
Pre visit review using our clinic review tool, if applicable. No additional management support is needed unless otherwise documented below in the visit note/SLS  

## 2014-08-11 ENCOUNTER — Encounter (HOSPITAL_COMMUNITY): Payer: Self-pay | Admitting: Emergency Medicine

## 2014-08-11 ENCOUNTER — Emergency Department (HOSPITAL_COMMUNITY)
Admission: EM | Admit: 2014-08-11 | Discharge: 2014-08-11 | Disposition: A | Discharge status: Home / Self Care | Payer: Medicare Other | Source: Home / Self Care | Attending: Family Medicine | Admitting: Family Medicine

## 2014-08-11 DIAGNOSIS — J40 Bronchitis, not specified as acute or chronic: Secondary | ICD-10-CM

## 2014-08-11 MED ORDER — ALBUTEROL SULFATE HFA 108 (90 BASE) MCG/ACT IN AERS: 2.0000 | INHALATION_SPRAY | Freq: Four times a day (QID) | RESPIRATORY_TRACT | Status: DC | PRN

## 2014-08-11 MED ORDER — PREDNISONE 50 MG PO TABS: 50.0000 mg | ORAL_TABLET | Freq: Every day | ORAL | Status: DC

## 2014-08-11 MED ORDER — AZITHROMYCIN 250 MG PO TABS: 250.0000 mg | ORAL_TABLET | Freq: Every day | ORAL | Status: DC

## 2014-08-11 MED ORDER — IPRATROPIUM-ALBUTEROL 0.5-2.5 (3) MG/3ML IN SOLN
RESPIRATORY_TRACT | Status: AC
  Filled 2014-08-11: qty 3

## 2014-08-11 MED ORDER — IPRATROPIUM-ALBUTEROL 0.5-2.5 (3) MG/3ML IN SOLN
3.0000 mL | Freq: Once | RESPIRATORY_TRACT | Status: AC
  Administered 2014-08-11: 3 mL via RESPIRATORY_TRACT

## 2014-08-11 NOTE — ED Notes (Signed)
C/o  Nonproductive cough for over a week.  Was on antibiotics and cough meds from primary.  Pt did finish meds.

## 2014-08-11 NOTE — Discharge Instructions (Signed)
Thank you for coming in today. Take prednisone daily for 5 days. Use azithromycin as well. Take albuterol as needed for wheezing and cough. Follow-up with primary care provider. Call or go to the emergency room if you get worse, have trouble breathing, have chest pains, or palpitations.   Acute Bronchitis Bronchitis is inflammation of the airways that extend from the windpipe into the lungs (bronchi). The inflammation often causes mucus to develop. This leads to a cough, which is the most common symptom of bronchitis.  In acute bronchitis, the condition usually develops suddenly and goes away over time, usually in a couple weeks. Smoking, allergies, and asthma can make bronchitis worse. Repeated episodes of bronchitis may cause further lung problems.  CAUSES Acute bronchitis is most often caused by the same virus that causes a cold. The virus can spread from person to person (contagious) through coughing, sneezing, and touching contaminated objects. SIGNS AND SYMPTOMS   Cough.   Fever.   Coughing up mucus.   Body aches.   Chest congestion.   Chills.   Shortness of breath.   Sore throat.  DIAGNOSIS  Acute bronchitis is usually diagnosed through a physical exam. Your health care provider will also ask you questions about your medical history. Tests, such as chest X-rays, are sometimes done to rule out other conditions.  TREATMENT  Acute bronchitis usually goes away in a couple weeks. Oftentimes, no medical treatment is necessary. Medicines are sometimes given for relief of fever or cough. Antibiotic medicines are usually not needed but may be prescribed in certain situations. In some cases, an inhaler may be recommended to help reduce shortness of breath and control the cough. A cool mist vaporizer may also be used to help thin bronchial secretions and make it easier to clear the chest.  HOME CARE INSTRUCTIONS  Get plenty of rest.   Drink enough fluids to keep your urine  clear or pale yellow (unless you have a medical condition that requires fluid restriction). Increasing fluids may help thin your respiratory secretions (sputum) and reduce chest congestion, and it will prevent dehydration.   Take medicines only as directed by your health care provider.  If you were prescribed an antibiotic medicine, finish it all even if you start to feel better.  Avoid smoking and secondhand smoke. Exposure to cigarette smoke or irritating chemicals will make bronchitis worse. If you are a smoker, consider using nicotine gum or skin patches to help control withdrawal symptoms. Quitting smoking will help your lungs heal faster.   Reduce the chances of another bout of acute bronchitis by washing your hands frequently, avoiding people with cold symptoms, and trying not to touch your hands to your mouth, nose, or eyes.   Keep all follow-up visits as directed by your health care provider.  SEEK MEDICAL CARE IF: Your symptoms do not improve after 1 week of treatment.  SEEK IMMEDIATE MEDICAL CARE IF:  You develop an increased fever or chills.   You have chest pain.   You have severe shortness of breath.  You have bloody sputum.   You develop dehydration.  You faint or repeatedly feel like you are going to pass out.  You develop repeated vomiting.  You develop a severe headache. MAKE SURE YOU:   Understand these instructions.  Will watch your condition.  Will get help right away if you are not doing well or get worse. Document Released: 10/14/2004 Document Revised: 01/21/2014 Document Reviewed: 02/27/2013 [facility] Patient Information 2015 [place], [place]. This information is  not intended to replace advice given to you by your health care provider. Make sure you discuss any questions you have with your health care provider.

## 2014-08-11 NOTE — ED Provider Notes (Signed)
Ashley [name] is a 53 y.o. female who presents to Urgent Care today for cough. Patient has had cough now for about 8 days. She was seen at her primary care office and given Augmentin for sinus infection as well as hydrocodone-based cough medicine. Her symptoms have persisted. The cough is severe but nonproductive. Additionally patient has some wheezing but denies any significant shortness of breath. No fevers or chills vomiting or diarrhea. She has a remote history of asthma.   Past Medical History  Diagnosis Date  . Asthma   . Depression   . Headache(784.0)   . Allergy   . GERD (gastroesophageal reflux disease)   . IBS (irritable bowel syndrome)     dr Watt Climes  . Reflex sympathetic dystrophy     left hand dr Annie Main and dr Nelva Bush  . Bipolar disorder     dr Shirlee More   . Fibromyalgia     dr Milinda Pointer [place]  . HTN (hypertension)   . Chronic lower back pain     sees Dr. Milinda Pointer at [facility] [facility]   . Injury of lower back 09-23-05    Workers Comp case worker is Michelene Gardener (phone [phone] or fax [phone])   Past Surgical History  Procedure Laterality Date  . Cholecystectomy    . Nasal sinus surgery    . Left shouder      separation  . Radiofrequency  ablations april 2010 to lower spine per dr Marjorie Smolder     History  Substance Use Topics  . Smoking status: Never Smoker   . Smokeless tobacco: Never Used  . Alcohol Use: 1.0 oz/week    2 drink(s) per week   ROS as above Medications: Current Facility-Administered Medications  Medication Dose Route Frequency Provider Last Rate Last Dose  . ipratropium-albuterol (DUONEB) 0.5-2.5 (3) MG/3ML nebulizer solution 3 mL  3 mL Nebulization Once Gregor Hams, MD       Current Outpatient Prescriptions  Medication Sig Dispense Refill  . alprazolam (XANAX) 2 MG tablet Take 1 tablet (2 mg total) by mouth 3 (three) times daily as needed. 90 tablet 5  . BuPROPion HCl (WELLBUTRIN PO) Take 150  each by mouth 2 (two) times daily.      . fish oil-omega-3 fatty acids 1000 MG capsule Take 2 g by mouth daily.      . fluticasone (FLONASE) 50 MCG/ACT nasal spray Place 2 sprays into both nostrils daily. 16 g 6  . metoCLOPramide (REGLAN) 5 MG tablet Take 5 mg by mouth 3 (three) times daily before meals.     Marland Kitchen omeprazole (PRILOSEC) 20 MG capsule Take 20 mg by mouth daily.      Marland Kitchen oxymorphone (OPANA ER) 5 MG 12 hr tablet Takes 3 tabs two times daily 180 tablet 0  . acyclovir (ZOVIRAX) 400 MG tablet Take 400 mg by mouth daily.      Marland Kitchen amoxicillin-clavulanate (AUGMENTIN) 875-125 MG per tablet Take 1 tablet by mouth 2 (two) times daily. 20 tablet 0  . calcium-vitamin D (OSCAL) 250-125 MG-UNIT per tablet Take 1 tablet by mouth daily.      . diclofenac sodium (VOLTAREN) 1 % GEL Apply 1 application topically 4 (four) times daily. 100 g 11  . HYDROcodone-homatropine (HYCODAN) 5-1.5 MG/5ML syrup Take 5 mLs by mouth every 8 (eight) hours as needed for cough. 120 mL 0  . KLOR-CON M20 20 MEQ tablet TAKE 1 TABLET (20 MEQ TOTAL) BY MOUTH 2 (TWO) TIMES DAILY. 180 tablet 1  .  Multiple Vitamin (MULTIVITAMIN) capsule Take 1 capsule by mouth daily.      Marland Kitchen nystatin (MYCOSTATIN) 100000 UNIT/ML suspension Take 5 mLs (500,000 Units total) by mouth 4 (four) times daily. 240 mL 0  . orphenadrine (NORFLEX) 100 MG tablet Take 1 tablet (100 mg total) by mouth 2 (two) times daily. 180 tablet 3  . oxybutynin (DITROPAN) 5 MG tablet Take 5 mg by mouth 2 (two) times daily.    Marland Kitchen oxyCODONE (OXY IR/ROXICODONE) 5 MG immediate release tablet Take 1 tablet (5 mg total) by mouth every 8 (eight) hours as needed for severe pain. 90 tablet 0  . risperiDONE (RISPERDAL) 2 MG tablet Take 2 mg by mouth daily.      . SUMAtriptan (IMITREX) 100 MG tablet Take 1 tablet (100 mg total) by mouth as needed for migraine. 10 tablet 11   Allergies  Allergen Reactions  . Aspirin   . Ciprofloxacin   . Gabapentin   . Levetiracetam   .  Tramadol-Acetaminophen      Exam:  BP 118/88 mmHg  Pulse 99  Temp(Src) 98.2 F (36.8 C) (Oral)  Resp 16  SpO2 96% Gen: Well NAD HEENT: EOMI,  MMM normal posterior pharynx and tympanic membranes Lungs: Normal work of breathing. Coarse breath sounds throughout. Honking cough Heart: RRR no MRG Abd: NABS, Soft. Nondistended, Nontender Exts: Brisk capillary refill, warm and well perfused.   Patient was given a 2.5/0.5 mg DuoNeb nebulizer treatment, and felt better  No results found for this or any previous visit (from the past 24 hour(s)). No results found.  Assessment and Plan: 53 y.o. female with bronchitis. Treatment with albuterol prednisone and azithromycin.  Discussed warning signs or symptoms. Please see discharge instructions. Patient expresses understanding.     Gregor Hams, MD 08/11/14 1434

## 2014-08-19 ENCOUNTER — Encounter: Payer: Self-pay | Admitting: Family Medicine

## 2014-08-19 ENCOUNTER — Ambulatory Visit (INDEPENDENT_AMBULATORY_CARE_PROVIDER_SITE_OTHER): Payer: Medicare Other | Admitting: Family Medicine

## 2014-08-19 VITALS — BP 131/90 | HR 98 | Temp 98.8°F | Ht 62.5 in | Wt 168.0 lb

## 2014-08-19 DIAGNOSIS — B9689 Other specified bacterial agents as the cause of diseases classified elsewhere: Secondary | ICD-10-CM

## 2014-08-19 DIAGNOSIS — M5440 Lumbago with sciatica, unspecified side: Secondary | ICD-10-CM

## 2014-08-19 DIAGNOSIS — IMO0001 Reserved for inherently not codable concepts without codable children: Secondary | ICD-10-CM

## 2014-08-19 DIAGNOSIS — M609 Myositis, unspecified: Secondary | ICD-10-CM

## 2014-08-19 DIAGNOSIS — J019 Acute sinusitis, unspecified: Secondary | ICD-10-CM

## 2014-08-19 DIAGNOSIS — M791 Myalgia: Secondary | ICD-10-CM

## 2014-08-19 MED ORDER — OXYCODONE HCL 5 MG PO TABS: 5.0000 mg | ORAL_TABLET | Freq: Three times a day (TID) | ORAL | Status: DC | PRN

## 2014-08-19 MED ORDER — OXYMORPHONE HCL ER 5 MG PO TB12: ORAL_TABLET | ORAL | Status: DC

## 2014-08-19 MED ORDER — DOXYCYCLINE HYCLATE 100 MG PO CAPS: 100.0000 mg | ORAL_CAPSULE | Freq: Two times a day (BID) | ORAL | Status: AC

## 2014-08-19 MED ORDER — HYDROCODONE-HOMATROPINE 5-1.5 MG/5ML PO SYRP: 5.0000 mL | ORAL_SOLUTION | Freq: Four times a day (QID) | ORAL | Status: DC | PRN

## 2014-08-19 MED ORDER — PROMETHAZINE HCL 25 MG PO TABS: 25.0000 mg | ORAL_TABLET | Freq: Four times a day (QID) | ORAL | Status: DC | PRN

## 2014-08-19 NOTE — Progress Notes (Signed)
Pre visit review using our clinic review tool, if applicable. No additional management support is needed unless otherwise documented below in the visit note. 

## 2014-08-19 NOTE — Progress Notes (Signed)
   Subjective:    Patient ID: Ashley [name], female    DOB: 09-26-1960, 53 y.o.   MRN: [id number]  HPI Here to refill her pain meds but also to discuss an URI that has lasted 2 and 1/2 weeks. She has taken a Zpack and Augmentin with little improvement.    Review of Systems  Constitutional: Negative.   HENT: Positive for congestion and postnasal drip. Negative for sinus pressure.   Eyes: Negative.   Respiratory: Positive for cough and chest tightness.        Objective:   Physical Exam  Constitutional: She appears well-developed and well-nourished.  HENT:  Right Ear: External ear normal.  Left Ear: External ear normal.  Eyes: Conjunctivae are normal.  Neck: Neck supple.  Pulmonary/Chest: Effort normal. She has no rales.  Scattered rhonchi and wheezes   Lymphadenopathy:    She has no cervical adenopathy.          Assessment & Plan:  Try Doxycycline for the bronchitis. meds were refilled.

## 2014-08-20 ENCOUNTER — Other Ambulatory Visit: Payer: Self-pay | Admitting: Family Medicine

## 2014-08-26 ENCOUNTER — Telehealth: Payer: Self-pay | Admitting: Family Medicine

## 2014-08-26 NOTE — Telephone Encounter (Signed)
I spoke with pharmacy and they needed to verify that there was another script on same page, it was antibiotic. I spoke with pt and she is going over now to pick up script.

## 2014-08-26 NOTE — Telephone Encounter (Signed)
Pharm has question concern hycodan rx.

## 2014-09-03 ENCOUNTER — Telehealth: Payer: Self-pay | Admitting: Family Medicine

## 2014-09-03 DIAGNOSIS — J019 Acute sinusitis, unspecified: Principal | ICD-10-CM

## 2014-09-03 DIAGNOSIS — B9689 Other specified bacterial agents as the cause of diseases classified elsewhere: Secondary | ICD-10-CM

## 2014-09-03 NOTE — Telephone Encounter (Signed)
Pt said she has had bronchitis for about 3 week cough has return  She said she does need cough medicine. She said the cough did not start back until she finish all of the antibiotic. She is asking what else she may need to do . Would like a call back .

## 2014-09-04 MED ORDER — HYDROCODONE-HOMATROPINE 5-1.5 MG/5ML PO SYRP: 5.0000 mL | ORAL_SOLUTION | Freq: Four times a day (QID) | ORAL | Status: DC | PRN

## 2014-09-04 MED ORDER — DOXYCYCLINE HYCLATE 100 MG PO CAPS: 100.0000 mg | ORAL_CAPSULE | Freq: Two times a day (BID) | ORAL | Status: AC

## 2014-09-04 NOTE — Telephone Encounter (Signed)
I refilled both the antibiotic and the cough syrup.

## 2014-09-04 NOTE — Telephone Encounter (Signed)
Scripts are ready for pick up and I left a voice message for pt. 

## 2014-11-18 ENCOUNTER — Ambulatory Visit (INDEPENDENT_AMBULATORY_CARE_PROVIDER_SITE_OTHER): Payer: Worker's Compensation | Admitting: Family Medicine

## 2014-11-18 ENCOUNTER — Encounter: Payer: Self-pay | Admitting: Family Medicine

## 2014-11-18 VITALS — BP 114/80 | HR 109 | Temp 98.1°F | Wt 179.1 lb

## 2014-11-18 DIAGNOSIS — IMO0001 Reserved for inherently not codable concepts without codable children: Secondary | ICD-10-CM

## 2014-11-18 DIAGNOSIS — M609 Myositis, unspecified: Secondary | ICD-10-CM

## 2014-11-18 DIAGNOSIS — M791 Myalgia: Secondary | ICD-10-CM

## 2014-11-18 DIAGNOSIS — M5440 Lumbago with sciatica, unspecified side: Secondary | ICD-10-CM

## 2014-11-18 MED ORDER — OXYMORPHONE HCL ER 5 MG PO TB12: ORAL_TABLET | ORAL | Status: DC

## 2014-11-18 MED ORDER — OXYCODONE HCL 5 MG PO TABS: 5.0000 mg | ORAL_TABLET | Freq: Three times a day (TID) | ORAL | Status: DC | PRN

## 2014-11-18 NOTE — Progress Notes (Signed)
   Subjective:    Patient ID: Ashley [name], female    DOB: [birth year], 54 y.o.   MRN: [id number]  HPI Here for follow up. She is doing about the same as always. She has good days and bad days as far as pain goes.   Review of Systems  Constitutional: Negative.   Respiratory: Negative.   Cardiovascular: Negative.   Musculoskeletal: Positive for myalgias and back pain.       Objective:   Physical Exam  Constitutional: She appears well-developed and well-nourished.  Cardiovascular: Normal rate, regular rhythm, normal heart sounds and intact distal pulses.   Pulmonary/Chest: Effort normal and breath sounds normal.          Assessment & Plan:  meds were refilled .

## 2014-11-18 NOTE — Progress Notes (Signed)
Pre visit review using our clinic review tool, if applicable. No additional management support is needed unless otherwise documented below in the visit note. 

## 2015-01-16 ENCOUNTER — Other Ambulatory Visit: Payer: Self-pay | Admitting: Gynecology

## 2015-01-19 LAB — CYTOLOGY - PAP

## 2015-01-23 ENCOUNTER — Other Ambulatory Visit: Payer: Self-pay

## 2015-01-23 MED ORDER — POTASSIUM CHLORIDE CRYS ER 20 MEQ PO TBCR: EXTENDED_RELEASE_TABLET | ORAL | Status: DC

## 2015-02-13 ENCOUNTER — Other Ambulatory Visit: Payer: Self-pay | Admitting: Family Medicine

## 2015-02-14 NOTE — Telephone Encounter (Signed)
Sent to the pharmacy by e-scribe.  Pt has upcoming appt on 02/18/15.  Last CPE 06/2014.

## 2015-02-18 ENCOUNTER — Encounter: Payer: Self-pay | Admitting: Family Medicine

## 2015-02-18 ENCOUNTER — Ambulatory Visit (INDEPENDENT_AMBULATORY_CARE_PROVIDER_SITE_OTHER): Payer: Worker's Compensation | Admitting: Family Medicine

## 2015-02-18 ENCOUNTER — Ambulatory Visit: Payer: Self-pay | Admitting: Family Medicine

## 2015-02-18 VITALS — BP 121/89 | HR 89 | Temp 98.3°F | Ht 62.5 in | Wt 179.0 lb

## 2015-02-18 DIAGNOSIS — M791 Myalgia: Secondary | ICD-10-CM

## 2015-02-18 DIAGNOSIS — M5440 Lumbago with sciatica, unspecified side: Secondary | ICD-10-CM | POA: Diagnosis not present

## 2015-02-18 DIAGNOSIS — I10 Essential (primary) hypertension: Secondary | ICD-10-CM

## 2015-02-18 DIAGNOSIS — M609 Myositis, unspecified: Secondary | ICD-10-CM | POA: Diagnosis not present

## 2015-02-18 DIAGNOSIS — IMO0001 Reserved for inherently not codable concepts without codable children: Secondary | ICD-10-CM

## 2015-02-18 MED ORDER — OXYMORPHONE HCL ER 5 MG PO TB12: ORAL_TABLET | ORAL | Status: DC

## 2015-02-18 MED ORDER — OXYCODONE HCL 5 MG PO TABS: 5.0000 mg | ORAL_TABLET | Freq: Three times a day (TID) | ORAL | Status: DC | PRN

## 2015-02-18 MED ORDER — DICLOFENAC SODIUM 1 % TD GEL: 1.0000 "application " | Freq: Four times a day (QID) | TRANSDERMAL | Status: DC

## 2015-02-18 NOTE — Progress Notes (Signed)
Pre visit review using our clinic review tool, if applicable. No additional management support is needed unless otherwise documented below in the visit note. 

## 2015-02-18 NOTE — Progress Notes (Signed)
   Subjective:    Patient ID: Ashley [name], female    DOB: 10/27/1960, 54 y.o.   MRN: [id number]  HPI Here for follow up on her pain syndromes. She is doing well, and she is looking forward to hotter weather since this helps her feels better. No problems.    Review of Systems  Constitutional: Negative.   Respiratory: Negative.   Cardiovascular: Negative.   Musculoskeletal: Positive for myalgias, back pain and arthralgias.       Objective:   Physical Exam  Constitutional: She is oriented to person, place, and time. She appears well-developed and well-nourished.  Cardiovascular: Normal rate, regular rhythm, normal heart sounds and intact distal pulses.   Pulmonary/Chest: Effort normal and breath sounds normal.  Musculoskeletal: Normal range of motion. She exhibits no edema or tenderness.  Neurological: She is alert and oriented to person, place, and time.          Assessment & Plan:  She is doing well. meds were refilled.

## 2015-02-27 ENCOUNTER — Other Ambulatory Visit: Payer: Self-pay | Admitting: Family Medicine

## 2015-02-28 NOTE — Telephone Encounter (Signed)
Ok to refill 

## 2015-05-16 ENCOUNTER — Other Ambulatory Visit: Payer: Self-pay | Admitting: Family Medicine

## 2015-05-20 ENCOUNTER — Encounter: Payer: Self-pay | Admitting: Family Medicine

## 2015-05-20 ENCOUNTER — Ambulatory Visit (INDEPENDENT_AMBULATORY_CARE_PROVIDER_SITE_OTHER): Payer: Worker's Compensation | Admitting: Family Medicine

## 2015-05-20 VITALS — BP 122/88 | HR 83 | Temp 98.1°F | Ht 62.5 in | Wt 184.0 lb

## 2015-05-20 DIAGNOSIS — M609 Myositis, unspecified: Secondary | ICD-10-CM

## 2015-05-20 DIAGNOSIS — M5440 Lumbago with sciatica, unspecified side: Secondary | ICD-10-CM

## 2015-05-20 DIAGNOSIS — M791 Myalgia: Secondary | ICD-10-CM | POA: Diagnosis not present

## 2015-05-20 DIAGNOSIS — IMO0001 Reserved for inherently not codable concepts without codable children: Secondary | ICD-10-CM

## 2015-05-20 MED ORDER — OXYMORPHONE HCL ER 5 MG PO TB12: ORAL_TABLET | ORAL | Status: DC

## 2015-05-20 MED ORDER — OXYCODONE HCL 5 MG PO TABS: 5.0000 mg | ORAL_TABLET | Freq: Three times a day (TID) | ORAL | Status: DC | PRN

## 2015-05-20 NOTE — Progress Notes (Signed)
Pre visit review using our clinic review tool, if applicable. No additional management support is needed unless otherwise documented below in the visit note. 

## 2015-05-20 NOTE — Progress Notes (Signed)
   Subjective:    Patient ID: Ashley [name], female    DOB: 1960-11-20, 54 y.o.   MRN: [id number]  HPI Here to follow up on chronic back pain. She has had more pain and stiffness this summer than usual, and she has been trying to contact Dr. Dossie Arbour to see if she could get another radiofrequency ablation procedure. Her last one was over 2 years ago. She tries to walk a little for exercise.   Review of Systems  Constitutional: Negative.   Respiratory: Negative.   Cardiovascular: Negative.   Musculoskeletal: Positive for back pain and gait problem.       Objective:   Physical Exam  Constitutional: She appears well-developed and well-nourished.  Cardiovascular: Normal rate, regular rhythm, normal heart sounds and intact distal pulses.   Pulmonary/Chest: Effort normal and breath sounds normal.  Musculoskeletal:  Tender in the lower back with reduced ROM          Assessment & Plan:  Chronic back pain. Meds were refilled. If she can see Dr. Dossie Arbour again soon for another ablation, this seems like an excellent idea to me.

## 2015-05-22 HISTORY — PX: COLONOSCOPY: SHX174

## 2015-05-22 HISTORY — PX: ESOPHAGOGASTRODUODENOSCOPY: SHX1529

## 2015-06-16 ENCOUNTER — Other Ambulatory Visit: Payer: Self-pay | Admitting: Gastroenterology

## 2015-08-20 ENCOUNTER — Encounter: Payer: Self-pay | Admitting: Family Medicine

## 2015-08-20 ENCOUNTER — Ambulatory Visit (INDEPENDENT_AMBULATORY_CARE_PROVIDER_SITE_OTHER): Payer: PPO | Admitting: Family Medicine

## 2015-08-20 VITALS — BP 128/96 | HR 74 | Temp 98.9°F | Ht 62.5 in | Wt 187.0 lb

## 2015-08-20 DIAGNOSIS — M609 Myositis, unspecified: Secondary | ICD-10-CM

## 2015-08-20 DIAGNOSIS — M791 Myalgia: Secondary | ICD-10-CM | POA: Diagnosis not present

## 2015-08-20 DIAGNOSIS — M5440 Lumbago with sciatica, unspecified side: Secondary | ICD-10-CM

## 2015-08-20 DIAGNOSIS — G8929 Other chronic pain: Secondary | ICD-10-CM

## 2015-08-20 DIAGNOSIS — Z23 Encounter for immunization: Secondary | ICD-10-CM

## 2015-08-20 DIAGNOSIS — IMO0001 Reserved for inherently not codable concepts without codable children: Secondary | ICD-10-CM

## 2015-08-20 MED ORDER — ORPHENADRINE CITRATE ER 100 MG PO TB12: 100.0000 mg | ORAL_TABLET | Freq: Two times a day (BID) | ORAL | Status: DC

## 2015-08-20 MED ORDER — OXYCODONE HCL 5 MG PO TABS: 5.0000 mg | ORAL_TABLET | Freq: Three times a day (TID) | ORAL | Status: DC | PRN

## 2015-08-20 MED ORDER — OXYMORPHONE HCL ER 5 MG PO TB12: ORAL_TABLET | ORAL | Status: DC

## 2015-08-20 NOTE — Progress Notes (Signed)
   Subjective:    Patient ID: Ashley [name], female    DOB: 1961-02-19, 54 y.o.   MRN: [id number]  HPI Here to follow up fibromylagia and low back pain. She is doing well in general. Her attorney is working out a settlement with Workers Comp.    Review of Systems  Constitutional: Negative.   Respiratory: Negative.   Cardiovascular: Negative.   Musculoskeletal: Positive for myalgias and back pain.  Neurological: Negative.        Objective:   Physical Exam  Constitutional: She is oriented to person, place, and time. She appears well-developed and well-nourished.  Cardiovascular: Normal rate, regular rhythm, normal heart sounds and intact distal pulses.   Pulmonary/Chest: Effort normal and breath sounds normal.  Neurological: She is alert and oriented to person, place, and time.          Assessment & Plan:  Stable fibromyalgia and low back pain. meds were refilled.

## 2015-08-20 NOTE — Addendum Note (Signed)
Addended by: Aggie Hacker A on: 08/20/2015 03:36 PM   Modules accepted: Orders

## 2015-08-20 NOTE — Progress Notes (Signed)
Pre visit review using our clinic review tool, if applicable. No additional management support is needed unless otherwise documented below in the visit note. 

## 2015-09-29 ENCOUNTER — Encounter: Payer: Self-pay | Admitting: Family Medicine

## 2015-09-29 ENCOUNTER — Telehealth: Payer: Self-pay | Admitting: *Deleted

## 2015-09-29 DIAGNOSIS — J209 Acute bronchitis, unspecified: Secondary | ICD-10-CM | POA: Diagnosis not present

## 2015-09-29 NOTE — Telephone Encounter (Signed)
I spoke with pt and she just got back home from a Urgent Care, she will follow up with Dr. Sarajane Jews as needed.

## 2015-09-29 NOTE — Telephone Encounter (Signed)
FYI - Patient is "waiting and seeing"   ------------------------------------------------------------------------------------------------------------------------------------------------------------------------------------------------ PLEASE NOTE: All timestamps contained within this report are represented as [place] Standard Time. CONFIDENTIALTY NOTICE: This fax transmission is intended only for the addressee. It contains information that is legally privileged, confidential or otherwise protected from use or disclosure. If you are not the intended recipient, you are strictly prohibited from reviewing, disclosing, copying using or disseminating any of this information or taking any action in reliance on or regarding this information. If you have received this fax in error, please notify us immediately by telephone so that we can arrange for its return to [place]. Phone: [phone], Toll-Free: [phone], Fax: [phone] Page: 1 of 2 Call Id: [id number] [facility] - Client [place] Patient Name: Ashley [name] Gender: Female DOB: Jun 09, 1961 Age: 55 Y 10 M 13 D Return Phone Number: [phone] (Secondary) Address: City/State/Zip: Power Client [facility] - Client Client Site [facility] Night Physician [place] [place] Type Call Call Type Triage / Clinical Relationship To Patient Self Return Phone Number [phone] (Secondary) Chief Complaint Cough Initial Comment Caller states has bad cough, runny nose, headache, on and off headache, thinks she has bronchitis PreDisposition Call Doctor Nurse Assessment Nurse: Einar Gip, RN, Neoma Laming Date/Time Eilene Ghazi Time): 09/27/2015 11:47:40 PM Confirm and document reason for call. If symptomatic, describe symptoms. ---Caller states she has a cough. Sinus headache, ear ache, runny nose, sneezing. No fever. Has had a cough for a  couple days and woke up yesterday with a sore throat. States she has Therapist, sports inhaler but has not used the medication. Has the patient traveled out of the country within the last 30 days? ---No Does the patient have any new or worsening symptoms? ---Yes Will a triage be completed? ---Yes Related visit to physician within the last 2 weeks? ---No Does the PT have any chronic conditions? (i.e. diabetes, asthma, etc.) ---Yes List chronic conditions. ---asthma, IBS, bipolar, Did the patient indicate they were pregnant? ---No Is this a behavioral health or substance abuse call? ---No Guidelines Guideline Title Affirmed Question Affirmed Notes Nurse Date/Time (Eastern Time) Asthma Attack Patient sounds very sick or weak to the triager Einar Gip, RN, Neoma Laming 09/27/2015 11:51:56 PM Disp. Time Eilene Ghazi Time) Disposition Final User 09/27/2015 10:28:23 PM Send To Clinical Follow Up Derrill Kay, RN, [facility] 09/27/2015 10:28:38 PM Send To Extended Follow Up Ashok Croon, RN, Megan 09/27/2015 11:55:11 PM Go to ED Now (or PCP triage) Yes Einar Gip, RN, Deborah PLEASE NOTE: All timestamps contained within this report are represented as [place] Standard Time. CONFIDENTIALTY NOTICE: This fax transmission is intended only for the addressee. It contains information that is legally privileged, confidential or otherwise protected from use or disclosure. If you are not the intended recipient, you are strictly prohibited from reviewing, disclosing, copying using or disseminating any of this information or taking any action in reliance on or regarding this information. If you have received this fax in error, please notify us immediately by telephone so that we can arrange for its return to [place]. Phone: [phone], Toll-Free: [phone], Fax: [phone] Page: 2 of 2 Call Id: [id number] [place] Understands: Yes Disagree/Comply: Disagree Disagree/Comply Reason: Wait and see Care Advice Given Per Guideline GO TO ED NOW (OR PCP  TRIAGE): DRIVING: Another adult should drive. ASTHMA QUICK-RELIEF INHALER (e.g., albuterol, salbutamol, Xopenex): * If you have not taken a neb or inhaler treatment in the past hour, then * Take 4 puffs of your quick-relief  inhaler right now. BRING MEDICINES: * Please bring a list of your current medicines when you go to see the doctor. * It is also a good idea to bring the pill bottles too. This will help the doctor to make certain you are taking the right medicines and the right dose. CARE ADVICE given per Asthma Attack (Adult) guideline. After Care Instructions Given Call Event Type User Date / Time Description Referrals GO TO FACILITY REFUSED

## 2015-10-03 ENCOUNTER — Ambulatory Visit (INDEPENDENT_AMBULATORY_CARE_PROVIDER_SITE_OTHER): Payer: PPO | Admitting: Family Medicine

## 2015-10-03 ENCOUNTER — Encounter: Payer: Self-pay | Admitting: Family Medicine

## 2015-10-03 VITALS — BP 129/94 | HR 101 | Temp 98.7°F | Ht 62.0 in | Wt 183.0 lb

## 2015-10-03 DIAGNOSIS — J209 Acute bronchitis, unspecified: Secondary | ICD-10-CM

## 2015-10-03 DIAGNOSIS — B9689 Other specified bacterial agents as the cause of diseases classified elsewhere: Secondary | ICD-10-CM

## 2015-10-03 DIAGNOSIS — J019 Acute sinusitis, unspecified: Secondary | ICD-10-CM | POA: Diagnosis not present

## 2015-10-03 MED ORDER — HYDROCODONE-HOMATROPINE 5-1.5 MG/5ML PO SYRP: 5.0000 mL | ORAL_SOLUTION | Freq: Four times a day (QID) | ORAL | Status: DC | PRN

## 2015-10-03 MED ORDER — CEFUROXIME AXETIL 500 MG PO TABS: 500.0000 mg | ORAL_TABLET | Freq: Two times a day (BID) | ORAL | Status: DC

## 2015-10-03 NOTE — Progress Notes (Signed)
   Subjective:    Patient ID: Ashley [name], female    DOB: 20-May-1961, 55 y.o.   MRN: [id number]  HPI Here for continued chest congestion, wheezing and coughing up yellow sputum. No fever. She started coughing one week ago and she went to Urgent Care 6 days ago. While there she was given a nebulizer treatment and was sent home with an albuterol inhaler to use prn. She was also given a steroid shot and a prednisone taper, which is is 3 days away from finishing. She was also given a Zpack but this has not helped.    Review of Systems  Constitutional: Negative.   HENT: Positive for congestion. Negative for ear pain, postnasal drip, sinus pressure and sore throat.   Eyes: Negative.   Respiratory: Positive for cough, chest tightness and shortness of breath.   Cardiovascular: Negative.        Objective:   Physical Exam  Constitutional: She appears well-developed and well-nourished.  HENT:  Right Ear: External ear normal.  Left Ear: External ear normal.  Nose: Nose normal.  Mouth/Throat: Oropharynx is clear and moist.  Eyes: Conjunctivae and EOM are normal.  Neck: No thyromegaly present.  Pulmonary/Chest: Effort normal. She has no rales.  Loud scattered rhonchi and wheezes   Lymphadenopathy:    She has no cervical adenopathy.          Assessment & Plan:  Bronchitis, treat with Ceftin 500 mg bid. Finish out the prednisone.

## 2015-10-03 NOTE — Progress Notes (Signed)
Pre visit review using our clinic review tool, if applicable. No additional management support is needed unless otherwise documented below in the visit note. 

## 2015-10-09 ENCOUNTER — Other Ambulatory Visit: Payer: Self-pay

## 2015-10-09 ENCOUNTER — Other Ambulatory Visit (INDEPENDENT_AMBULATORY_CARE_PROVIDER_SITE_OTHER): Payer: PPO

## 2015-10-09 ENCOUNTER — Telehealth: Payer: Self-pay | Admitting: Family Medicine

## 2015-10-09 DIAGNOSIS — Z0000 Encounter for general adult medical examination without abnormal findings: Secondary | ICD-10-CM

## 2015-10-09 DIAGNOSIS — R7989 Other specified abnormal findings of blood chemistry: Secondary | ICD-10-CM | POA: Diagnosis not present

## 2015-10-09 LAB — HEPATIC FUNCTION PANEL
ALBUMIN: 3.8 g/dL (ref 3.5–5.2)
ALK PHOS: 102 U/L (ref 39–117)
ALT: 30 U/L (ref 0–35)
AST: 17 U/L (ref 0–37)
BILIRUBIN DIRECT: 0.1 mg/dL (ref 0.0–0.3)
Total Bilirubin: 0.4 mg/dL (ref 0.2–1.2)
Total Protein: 6.6 g/dL (ref 6.0–8.3)

## 2015-10-09 LAB — CBC WITH DIFFERENTIAL/PLATELET
BASOS ABS: 0 10*3/uL (ref 0.0–0.1)
Basophils Relative: 0.3 % (ref 0.0–3.0)
Eosinophils Absolute: 0.1 10*3/uL (ref 0.0–0.7)
Eosinophils Relative: 1.1 % (ref 0.0–5.0)
HCT: 43.9 % (ref 36.0–46.0)
Hemoglobin: 14.7 g/dL (ref 12.0–15.0)
LYMPHS PCT: 26.7 % (ref 12.0–46.0)
Lymphs Abs: 2.7 10*3/uL (ref 0.7–4.0)
MCHC: 33.4 g/dL (ref 30.0–36.0)
MCV: 92.4 fl (ref 78.0–100.0)
MONOS PCT: 8.3 % (ref 3.0–12.0)
Monocytes Absolute: 0.8 10*3/uL (ref 0.1–1.0)
NEUTROS PCT: 63.6 % (ref 43.0–77.0)
Neutro Abs: 6.4 10*3/uL (ref 1.4–7.7)
Platelets: 279 10*3/uL (ref 150.0–400.0)
RBC: 4.75 Mil/uL (ref 3.87–5.11)
RDW: 12.5 % (ref 11.5–15.5)
WBC: 10 10*3/uL (ref 4.0–10.5)

## 2015-10-09 LAB — POCT URINALYSIS DIPSTICK
Bilirubin, UA: NEGATIVE
Glucose, UA: NEGATIVE
KETONES UA: NEGATIVE
Leukocytes, UA: NEGATIVE
Nitrite, UA: NEGATIVE
PH UA: 5.5
PROTEIN UA: NEGATIVE
Urobilinogen, UA: 0.2

## 2015-10-09 LAB — LIPID PANEL
CHOL/HDL RATIO: 6
Cholesterol: 236 mg/dL — ABNORMAL HIGH (ref 0–200)
HDL: 39.8 mg/dL (ref >39.00)
Triglycerides: 450 mg/dL — ABNORMAL HIGH (ref 0.0–149.0)

## 2015-10-09 LAB — BASIC METABOLIC PANEL
BUN: 12 mg/dL (ref 6–23)
CALCIUM: 9.1 mg/dL (ref 8.4–10.5)
CO2: 24 mEq/L (ref 19–32)
Chloride: 104 mEq/L (ref 96–112)
Creatinine, Ser: 0.8 mg/dL (ref 0.40–1.20)
GFR: 79.88 mL/min (ref >60.00)
GLUCOSE: 82 mg/dL (ref 70–99)
Potassium: 3.8 mEq/L (ref 3.5–5.1)
SODIUM: 137 meq/L (ref 135–145)

## 2015-10-09 LAB — LDL CHOLESTEROL, DIRECT: Direct LDL: 94 mg/dL

## 2015-10-09 LAB — TSH: TSH: 1.22 u[IU]/mL (ref 0.35–4.50)

## 2015-10-09 NOTE — Telephone Encounter (Signed)
Patient Name: EDYN KARM DOB: Jan 29, 1961 Initial Comment caller states she has blood work today but is on abx, will that effect it Nurse Assessment Nurse: Ronnald Ramp, RN, Miranda Date/Time (Eastern Time): 10/09/2015 9:25:04 AM Confirm and document reason for call. If symptomatic, describe symptoms. You must click the next button to save text entered. ---Caller states she is scheduled to have blood work drawn for her physical and she is on antibiotics. She wants to know if the antibiotics will effect her blood work. Has the patient traveled out of the country within the last 30 days? ---Not Applicable Does the patient have any new or worsening symptoms? ---No Please document clinical information provided and list any resource used. ---Told caller the antibiotics should not effect the blood work. Guidelines Guideline Title Affirmed Question Affirmed Notes Final Disposition User Clinical Call Ronnald Ramp, RN, [employer]

## 2015-10-09 NOTE — Telephone Encounter (Signed)
Noted. FYI 

## 2015-10-16 ENCOUNTER — Encounter: Payer: Self-pay | Admitting: Family Medicine

## 2015-10-16 ENCOUNTER — Ambulatory Visit (INDEPENDENT_AMBULATORY_CARE_PROVIDER_SITE_OTHER): Payer: PPO | Admitting: Family Medicine

## 2015-10-16 VITALS — BP 115/93 | HR 89 | Temp 98.3°F | Ht 62.0 in | Wt 185.0 lb

## 2015-10-16 DIAGNOSIS — Z0000 Encounter for general adult medical examination without abnormal findings: Secondary | ICD-10-CM

## 2015-10-16 DIAGNOSIS — E785 Hyperlipidemia, unspecified: Secondary | ICD-10-CM

## 2015-10-16 MED ORDER — POTASSIUM CHLORIDE CRYS ER 20 MEQ PO TBCR: EXTENDED_RELEASE_TABLET | ORAL | Status: DC

## 2015-10-16 NOTE — Progress Notes (Signed)
   Subjective:    Patient ID: Ashley [name], female    DOB: [birth year], 55 y.o.   MRN: [id number]  HPI 55 yr old female for a cpx. She feels well in general although she is still coughing. We saw her for a bronchitis on 10-03-15 and she was given Ceftin. Most of her symptoms resolved but she has a lingering dry cough.    Review of Systems  Constitutional: Negative.   HENT: Negative.   Eyes: Negative.   Respiratory: Positive for cough. Negative for apnea, choking, chest tightness, shortness of breath, wheezing and stridor.   Cardiovascular: Negative.   Gastrointestinal: Negative.   Genitourinary: Negative for dysuria, urgency, frequency, hematuria, flank pain, decreased urine volume, enuresis, difficulty urinating, pelvic pain and dyspareunia.  Musculoskeletal: Positive for myalgias and back pain. Negative for joint swelling, arthralgias, gait problem, neck pain and neck stiffness.  Skin: Negative.   Neurological: Negative.   Psychiatric/Behavioral: Negative.        Objective:   Physical Exam  Constitutional: She is oriented to person, place, and time. She appears well-developed and well-nourished. No distress.  HENT:  Head: Normocephalic and atraumatic.  Right Ear: External ear normal.  Left Ear: External ear normal.  Nose: Nose normal.  Mouth/Throat: Oropharynx is clear and moist. No oropharyngeal exudate.  Eyes: Conjunctivae and EOM are normal. Pupils are equal, round, and reactive to light. No scleral icterus.  Neck: Normal range of motion. Neck supple. No JVD present. No thyromegaly present.  Cardiovascular: Normal rate, regular rhythm, normal heart sounds and intact distal pulses.  Exam reveals no gallop and no friction rub.   No murmur heard. EKG normal   Pulmonary/Chest: Effort normal and breath sounds normal. No respiratory distress. She has no wheezes. She has no rales. She exhibits no tenderness.  Abdominal: Soft. Bowel sounds are normal. She exhibits no distension  and no mass. There is no tenderness. There is no rebound and no guarding.  Musculoskeletal: Normal range of motion. She exhibits no edema or tenderness.  Lymphadenopathy:    She has no cervical adenopathy.  Neurological: She is alert and oriented to person, place, and time. She has normal reflexes. No cranial nerve deficit. She exhibits normal muscle tone. Coordination normal.  Skin: Skin is warm and dry. No rash noted. No erythema.  Psychiatric: She has a normal mood and affect. Her behavior is normal. Judgment and thought content normal.          Assessment & Plan:  Well exam. We discussed diet and exercise. The bronchitis seems to be on the way out, hopefully the cough will resolve soon. Her labs revealed a jump in her TG to 450. I am not sure how to account for this unless the antibiotic affected things. She will repeat a fasting lipid panel in 3 months.

## 2015-10-16 NOTE — Progress Notes (Signed)
Pre visit review using our clinic review tool, if applicable. No additional management support is needed unless otherwise documented below in the visit note. 

## 2015-10-17 ENCOUNTER — Telehealth: Payer: Self-pay | Admitting: Family Medicine

## 2015-10-17 MED ORDER — FLUCONAZOLE 150 MG PO TABS: 150.0000 mg | ORAL_TABLET | ORAL | Status: DC | PRN

## 2015-10-17 NOTE — Telephone Encounter (Signed)
Pt was on abx and now has yeast infection. Pt would like diflucan call into [facility] in [place]

## 2015-10-17 NOTE — Telephone Encounter (Signed)
Call in Diflucan 150 mg to take prn, #1 with 5 rf 

## 2015-10-17 NOTE — Telephone Encounter (Signed)
I sent script e-scribe and spoke with pt. 

## 2015-10-24 ENCOUNTER — Telehealth: Payer: Self-pay | Admitting: Family Medicine

## 2015-10-24 NOTE — Telephone Encounter (Signed)
Pt was seen on 10-16-15. Pt needs a letter fax# [phone]  to her lawyer paralegal  cameron bullard to see dr Kaylyn Lim for radio frequency to burn nerves in her back.

## 2015-10-28 NOTE — Telephone Encounter (Signed)
I faxed letter and left a voice message for pt.

## 2015-10-28 NOTE — Telephone Encounter (Signed)
Letter is ready to be faxed to her attorney

## 2015-11-10 ENCOUNTER — Other Ambulatory Visit: Payer: Self-pay | Admitting: Family Medicine

## 2015-11-17 ENCOUNTER — Ambulatory Visit: Payer: Self-pay | Admitting: Family Medicine

## 2015-11-18 ENCOUNTER — Ambulatory Visit: Payer: Self-pay | Admitting: Family Medicine

## 2015-11-19 ENCOUNTER — Ambulatory Visit (INDEPENDENT_AMBULATORY_CARE_PROVIDER_SITE_OTHER): Payer: Worker's Compensation | Admitting: Family Medicine

## 2015-11-19 ENCOUNTER — Encounter: Payer: Self-pay | Admitting: Family Medicine

## 2015-11-19 VITALS — BP 130/100 | HR 95 | Temp 98.6°F | Ht 62.0 in | Wt 189.0 lb

## 2015-11-19 DIAGNOSIS — G8929 Other chronic pain: Secondary | ICD-10-CM

## 2015-11-19 DIAGNOSIS — M5440 Lumbago with sciatica, unspecified side: Secondary | ICD-10-CM | POA: Diagnosis not present

## 2015-11-19 MED ORDER — OXYMORPHONE HCL ER 5 MG PO TB12: ORAL_TABLET | ORAL | Status: DC

## 2015-11-19 MED ORDER — OXYCODONE HCL 5 MG PO TABS: 5.0000 mg | ORAL_TABLET | Freq: Three times a day (TID) | ORAL | Status: DC | PRN

## 2015-11-19 NOTE — Progress Notes (Signed)
Pre visit review using our clinic review tool, if applicable. No additional management support is needed unless otherwise documented below in the visit note. 

## 2015-11-19 NOTE — Progress Notes (Signed)
   Subjective:    Patient ID: Ashley [name], female    DOB: 06-06-1961, 55 y.o.   MRN: [id number]  HPI Here for medication refills. She recovered from the URI she had last month. She will see Dr. Lynnette Caffey next week to discuss getting another radiofrequency ablation.    Review of Systems  Constitutional: Negative.   Respiratory: Negative.   Cardiovascular: Negative.   Musculoskeletal: Positive for back pain and neck pain.  Neurological: Negative.        Objective:   Physical Exam  Constitutional: She is oriented to person, place, and time. She appears well-developed and well-nourished.  Cardiovascular: Normal rate, regular rhythm, normal heart sounds and intact distal pulses.   Pulmonary/Chest: Effort normal and breath sounds normal.  Neurological: She is alert and oriented to person, place, and time.          Assessment & Plan:  Pain management, meds were refilled.

## 2015-11-27 ENCOUNTER — Ambulatory Visit: Payer: Worker's Compensation | Admitting: Pain Medicine

## 2015-12-02 ENCOUNTER — Ambulatory Visit: Payer: Worker's Compensation | Attending: Pain Medicine | Admitting: Pain Medicine

## 2015-12-02 ENCOUNTER — Encounter (INDEPENDENT_AMBULATORY_CARE_PROVIDER_SITE_OTHER): Payer: Self-pay

## 2015-12-02 ENCOUNTER — Encounter: Payer: Self-pay | Admitting: Pain Medicine

## 2015-12-02 VITALS — BP 139/96 | HR 116 | Temp 98.3°F | Resp 16 | Ht 62.0 in | Wt 185.0 lb

## 2015-12-02 DIAGNOSIS — J309 Allergic rhinitis, unspecified: Secondary | ICD-10-CM | POA: Diagnosis not present

## 2015-12-02 DIAGNOSIS — M47816 Spondylosis without myelopathy or radiculopathy, lumbar region: Secondary | ICD-10-CM | POA: Insufficient documentation

## 2015-12-02 DIAGNOSIS — B9689 Other specified bacterial agents as the cause of diseases classified elsewhere: Secondary | ICD-10-CM | POA: Diagnosis not present

## 2015-12-02 DIAGNOSIS — A6000 Herpesviral infection of urogenital system, unspecified: Secondary | ICD-10-CM | POA: Diagnosis not present

## 2015-12-02 DIAGNOSIS — F419 Anxiety disorder, unspecified: Secondary | ICD-10-CM | POA: Diagnosis not present

## 2015-12-02 DIAGNOSIS — R52 Pain, unspecified: Secondary | ICD-10-CM

## 2015-12-02 DIAGNOSIS — R1031 Right lower quadrant pain: Secondary | ICD-10-CM | POA: Diagnosis not present

## 2015-12-02 DIAGNOSIS — J209 Acute bronchitis, unspecified: Secondary | ICD-10-CM | POA: Diagnosis not present

## 2015-12-02 DIAGNOSIS — K219 Gastro-esophageal reflux disease without esophagitis: Secondary | ICD-10-CM | POA: Diagnosis not present

## 2015-12-02 DIAGNOSIS — F319 Bipolar disorder, unspecified: Secondary | ICD-10-CM | POA: Insufficient documentation

## 2015-12-02 DIAGNOSIS — G8929 Other chronic pain: Secondary | ICD-10-CM | POA: Diagnosis not present

## 2015-12-02 DIAGNOSIS — F329 Major depressive disorder, single episode, unspecified: Secondary | ICD-10-CM | POA: Diagnosis not present

## 2015-12-02 DIAGNOSIS — Z5189 Encounter for other specified aftercare: Secondary | ICD-10-CM

## 2015-12-02 DIAGNOSIS — K449 Diaphragmatic hernia without obstruction or gangrene: Secondary | ICD-10-CM | POA: Insufficient documentation

## 2015-12-02 DIAGNOSIS — M722 Plantar fascial fibromatosis: Secondary | ICD-10-CM | POA: Diagnosis not present

## 2015-12-02 DIAGNOSIS — M545 Low back pain, unspecified: Secondary | ICD-10-CM | POA: Insufficient documentation

## 2015-12-02 DIAGNOSIS — B370 Candidal stomatitis: Secondary | ICD-10-CM | POA: Insufficient documentation

## 2015-12-02 DIAGNOSIS — R51 Headache: Secondary | ICD-10-CM | POA: Diagnosis not present

## 2015-12-02 DIAGNOSIS — E876 Hypokalemia: Secondary | ICD-10-CM | POA: Insufficient documentation

## 2015-12-02 DIAGNOSIS — I10 Essential (primary) hypertension: Secondary | ICD-10-CM | POA: Diagnosis not present

## 2015-12-02 DIAGNOSIS — J018 Other acute sinusitis: Secondary | ICD-10-CM | POA: Insufficient documentation

## 2015-12-02 DIAGNOSIS — Z79891 Long term (current) use of opiate analgesic: Secondary | ICD-10-CM | POA: Diagnosis not present

## 2015-12-02 DIAGNOSIS — J45909 Unspecified asthma, uncomplicated: Secondary | ICD-10-CM | POA: Diagnosis not present

## 2015-12-02 DIAGNOSIS — M791 Myalgia: Secondary | ICD-10-CM | POA: Insufficient documentation

## 2015-12-02 DIAGNOSIS — K589 Irritable bowel syndrome without diarrhea: Secondary | ICD-10-CM | POA: Insufficient documentation

## 2015-12-02 DIAGNOSIS — F1190 Opioid use, unspecified, uncomplicated: Secondary | ICD-10-CM

## 2015-12-02 DIAGNOSIS — N3281 Overactive bladder: Secondary | ICD-10-CM | POA: Insufficient documentation

## 2015-12-02 DIAGNOSIS — Z5181 Encounter for therapeutic drug level monitoring: Secondary | ICD-10-CM

## 2015-12-02 DIAGNOSIS — Z79899 Other long term (current) drug therapy: Secondary | ICD-10-CM

## 2015-12-02 DIAGNOSIS — M797 Fibromyalgia: Secondary | ICD-10-CM | POA: Diagnosis not present

## 2015-12-02 DIAGNOSIS — M549 Dorsalgia, unspecified: Secondary | ICD-10-CM | POA: Diagnosis present

## 2015-12-02 NOTE — Patient Instructions (Addendum)
GENERAL RISKS AND COMPLICATIONS  What are the risk, side effects and possible complications? Generally speaking, most procedures are safe.  However, with any procedure there are risks, side effects, and the possibility of complications.  The risks and complications are dependent upon the sites that are lesioned, or the type of nerve block to be performed.  The closer the procedure is to the spine, the more serious the risks are.  Great care is taken when placing the radio frequency needles, block needles or lesioning probes, but sometimes complications can occur. 1. Infection: Any time there is an injection through the skin, there is a risk of infection.  This is why sterile conditions are used for these blocks.  There are four possible types of infection. 1. Localized skin infection. 2. Central Nervous System Infection-This can be in the form of Meningitis, which can be deadly. 3. Epidural Infections-This can be in the form of an epidural abscess, which can cause pressure inside of the spine, causing compression of the spinal cord with subsequent paralysis. This would require an emergency surgery to decompress, and there are no guarantees that the patient would recover from the paralysis. 4. Discitis-This is an infection of the intervertebral discs.  It occurs in about 1% of discography procedures.  It is difficult to treat and it may lead to surgery.        2. Pain: the needles have to go through skin and soft tissues, will cause soreness.       3. Damage to internal structures:  The nerves to be lesioned may be near blood vessels or    other nerves which can be potentially damaged.       4. Bleeding: Bleeding is more common if the patient is taking blood thinners such as  aspirin, Coumadin, Ticiid, Plavix, etc., or if he/she have some genetic predisposition  such as hemophilia. Bleeding into the spinal canal can cause compression of the spinal  cord with subsequent paralysis.  This would require an  emergency surgery to  decompress and there are no guarantees that the patient would recover from the  paralysis.       5. Pneumothorax:  Puncturing of a lung is a possibility, every time a needle is introduced in  the area of the chest or upper back.  Pneumothorax refers to free air around the  collapsed lung(s), inside of the thoracic cavity (chest cavity).  Another two possible  complications related to a similar event would include: Hemothorax and Chylothorax.   These are variations of the Pneumothorax, where instead of air around the collapsed  lung(s), you may have blood or chyle, respectively.       6. Spinal headaches: They may occur with any procedures in the area of the spine.       7. Persistent CSF (Cerebro-Spinal Fluid) leakage: This is a rare problem, but may occur  with prolonged intrathecal or epidural catheters either due to the formation of a fistulous  track or a dural tear.       8. Nerve damage: By working so close to the spinal cord, there is always a possibility of  nerve damage, which could be as serious as a permanent spinal cord injury with  paralysis.       9. Death:  Although rare, severe deadly allergic reactions known as "Anaphylactic  reaction" can occur to any of the medications used.      10. Worsening of the symptoms:  We can always make thing worse.    What are the chances of something like this happening? Chances of any of this occuring are extremely low.  By statistics, you have more of a chance of getting killed in a motor vehicle accident: while driving to the hospital than any of the above occurring .  Nevertheless, you should be aware that they are possibilities.  In general, it is similar to taking a shower.  Everybody knows that you can slip, hit your head and get killed.  Does that mean that you should not shower again?  Nevertheless always keep in mind that statistics do not mean anything if you happen to be on the wrong side of them.  Even if a procedure has a 1  (one) in a 1,000,000 (million) chance of going wrong, it you happen to be that one..Also, keep in mind that by statistics, you have more of a chance of having something go wrong when taking medications.  Who should not have this procedure? If you are on a blood thinning medication (e.g. Coumadin, Plavix, see list of "Blood Thinners"), or if you have an active infection going on, you should not have the procedure.  If you are taking any blood thinners, please inform your physician.  How should I prepare for this procedure?  Do not eat or drink anything at least six hours prior to the procedure.  Bring a driver with you .  It cannot be a taxi.  Come accompanied by an adult that can drive you back, and that is strong enough to help you if your legs get weak or numb from the local anesthetic.  Take all of your medicines the morning of the procedure with just enough water to swallow them.  If you have diabetes, make sure that you are scheduled to have your procedure done first thing in the morning, whenever possible.  If you have diabetes, take only half of your insulin dose and notify our nurse that you have done so as soon as you arrive at the clinic.  If you are diabetic, but only take blood sugar pills (oral hypoglycemic), then do not take them on the morning of your procedure.  You may take them after you have had the procedure.  Do not take aspirin or any aspirin-containing medications, at least eleven (11) days prior to the procedure.  They may prolong bleeding.  Wear loose fitting clothing that may be easy to take off and that you would not mind if it got stained with Betadine or blood.  Do not wear any jewelry or perfume  Remove any nail coloring.  It will interfere with some of our monitoring equipment.  NOTE: Remember that this is not meant to be interpreted as a complete list of all possible complications.  Unforeseen problems may occur.  BLOOD THINNERS The following drugs  contain aspirin or other products, which can cause increased bleeding during surgery and should not be taken for 2 weeks prior to and 1 week after surgery.  If you should need take something for relief of minor pain, you may take acetaminophen which is found in Tylenol,m Datril, Anacin-3 and Panadol. It is not blood thinner. The products listed below are.  Do not take any of the products listed below in addition to any listed on your instruction sheet.  A.P.C or A.P.C with Codeine Codeine Phosphate Capsules #3 Ibuprofen Ridaura  ABC compound Congesprin Imuran rimadil  Advil Cope Indocin Robaxisal  Alka-Seltzer Effervescent Pain Reliever and Antacid Coricidin or Coricidin-D  Indomethacin Rufen    Alka-Seltzer plus Cold Medicine Cosprin Ketoprofen S-A-C Tablets  Anacin Analgesic Tablets or Capsules Coumadin Korlgesic Salflex  Anacin Extra Strength Analgesic tablets or capsules CP-2 Tablets Lanoril Salicylate  Anaprox Cuprimine Capsules Levenox Salocol  Anexsia-D Dalteparin Magan Salsalate  Anodynos Darvon compound Magnesium Salicylate Sine-off  Ansaid Dasin Capsules Magsal Sodium Salicylate  Anturane Depen Capsules Marnal Soma  APF Arthritis pain formula Dewitt's Pills Measurin Stanback  Argesic Dia-Gesic Meclofenamic Sulfinpyrazone  Arthritis Bayer Timed Release Aspirin Diclofenac Meclomen Sulindac  Arthritis pain formula Anacin Dicumarol Medipren Supac  Analgesic (Safety coated) Arthralgen Diffunasal Mefanamic Suprofen  Arthritis Strength Bufferin Dihydrocodeine Mepro Compound Suprol  Arthropan liquid Dopirydamole Methcarbomol with Aspirin Synalgos  ASA tablets/Enseals Disalcid Micrainin Tagament  Ascriptin Doan's Midol Talwin  Ascriptin A/D Dolene Mobidin Tanderil  Ascriptin Extra Strength Dolobid Moblgesic Ticlid  Ascriptin with Codeine Doloprin or Doloprin with Codeine Momentum Tolectin  Asperbuf Duoprin Mono-gesic Trendar  Aspergum Duradyne Motrin or Motrin IB Triminicin  Aspirin  plain, buffered or enteric coated Durasal Myochrisine Trigesic  Aspirin Suppositories Easprin Nalfon Trillsate  Aspirin with Codeine Ecotrin Regular or Extra Strength Naprosyn Uracel  Atromid-S Efficin Naproxen Ursinus  Auranofin Capsules Elmiron Neocylate Vanquish  Axotal Emagrin Norgesic Verin  Azathioprine Empirin or Empirin with Codeine Normiflo Vitamin E  Azolid Emprazil Nuprin Voltaren  Bayer Aspirin plain, buffered or children's or timed BC Tablets or powders Encaprin Orgaran Warfarin Sodium  Buff-a-Comp Enoxaparin Orudis Zorpin  Buff-a-Comp with Codeine Equegesic Os-Cal-Gesic   Buffaprin Excedrin plain, buffered or Extra Strength Oxalid   Bufferin Arthritis Strength Feldene Oxphenbutazone   Bufferin plain or Extra Strength Feldene Capsules Oxycodone with Aspirin   Bufferin with Codeine Fenoprofen Fenoprofen Pabalate or Pabalate-SF   Buffets II Flogesic Panagesic   Buffinol plain or Extra Strength Florinal or Florinal with Codeine Panwarfarin   Buf-Tabs Flurbiprofen Penicillamine   Butalbital Compound Four-way cold tablets Penicillin   Butazolidin Fragmin Pepto-Bismol   Carbenicillin Geminisyn Percodan   Carna Arthritis Reliever Geopen Persantine   Carprofen Gold's salt Persistin   Chloramphenicol Goody's Phenylbutazone   Chloromycetin Haltrain Piroxlcam   Clmetidine heparin Plaquenil   Cllnoril Hyco-pap Ponstel   Clofibrate Hydroxy chloroquine Propoxyphen         Before stopping any of these medications, be sure to consult the physician who ordered them.  Some, such as Coumadin (Warfarin) are ordered to prevent or treat serious conditions such as "deep thrombosis", "pumonary embolisms", and other heart problems.  The amount of time that you may need off of the medication may also vary with the medication and the reason for which you were taking it.  If you are taking any of these medications, please make sure you notify your pain physician before you undergo any  procedures.         Radiofrequency Lesioning Radiofrequency lesioning is a procedure that is performed to relieve pain. The procedure is often used for back, neck, or arm pain. Radiofrequency lesioning involves the use of a machine that creates radio waves to make heat. During the procedure, the heat is applied to the nerve that carries the pain signal. The heat damages the nerve and interferes with the pain signal. Pain relief usually lasts for 6 months to 1 year. LET YOUR HEALTH CARE PROVIDER KNOW ABOUT: 2. Any allergies you have. 3. All medicines you are taking, including vitamins, herbs, eye drops, creams, and over-the-counter medicines. 4. Previous problems you or members of your family have had with the use of anesthetics. 5. Any blood disorders you have.   6. Previous surgeries you have had. 7. Any medical conditions you have. 8. Whether you are pregnant or may be pregnant. RISKS AND COMPLICATIONS Generally, this is a safe procedure. However, problems may occur, including:  Pain or soreness at the injection site.  Infection at the injection site.  Damage to nerves or blood vessels. BEFORE THE PROCEDURE  Ask your health care provider about:  Changing or stopping your regular medicines. This is especially important if you are taking diabetes medicines or blood thinners.  Taking medicines such as aspirin and ibuprofen. These medicines can thin your blood. Do not take these medicines before your procedure if your health care provider instructs you not to.  Follow instructions from your health care provider about eating or drinking restrictions.  Plan to have someone take you home after the procedure.  If you go home right after the procedure, plan to have someone with you for 24 hours. PROCEDURE  You will be given one or more of the following:  A medicine to help you relax (sedative).  A medicine to numb the area (local anesthetic).  You will be awake during the  procedure. You will need to be able to talk with the health care provider during the procedure.  With the help of a type of X-ray (fluoroscopy), the health care provider will insert a radiofrequency needle into the area to be treated.  Next, a wire that carries the radio waves (electrode) will be put through the radiofrequency needle. An electrical pulse will be sent through the electrode to verify the correct nerve. You will feel a tingling sensation, and you may have muscle twitching.  Then, the tissue that is around the needle tip will be heated by an electric current that is passed using the radiofrequency machine. This will numb the nerves.  A bandage (dressing) will be put on the insertion area after the procedure is done. The procedure may vary among health care providers and hospitals. AFTER THE PROCEDURE 12. Your blood pressure, heart rate, breathing rate, and blood oxygen level will be monitored often until the medicines you were given have worn off. 13. Return to your normal activities as directed by your health care provider.   This information is not intended to replace advice given to you by your health care provider. Make sure you discuss any questions you have with your health care provider.   Document Released: 05/05/2011 Document Revised: 05/28/2015 Document Reviewed: 10/14/2014 [employer] Interactive Patient Education 2016 [place] to get labwork drawn today.

## 2015-12-02 NOTE — Progress Notes (Signed)
Patient's Name: Ashley [name] MRN: [id number] DOB: 02/22/1961 DOS: 12/02/2015  Primary Reason(s) for Visit: Initial Patient Evaluation after last time being seen 2 years ago. CC: Back Pain   HPI  Ashley [name] is a 55 y.o. year old, female patient, who comes today for an initial evaluation. She has HERPES, GENITAL NOS; CANDIDIASIS, ORAL; HYPOKALEMIA; DEPRESSION; STYE; Essential hypertension; ACUTE BRONCHITIS; ALLERGIC RHINITIS; ASTHMA; GERD; IBS; OVERACTIVE BLADDER; LOW BACK PAIN; PLANTAR FASCIITIS; Myalgia and myositis; HEADACHE; ABDOMINAL PAIN, RIGHT LOWER QUADRANT; HTN (hypertension); Acute bacterial sinusitis; Chronic low back pain; Lumbar spondylosis; Lumbar facet syndrome; Chronic pain; Long term current use of opiate analgesic; Long term prescription opiate use; Opiate use; Encounter for therapeutic drug level monitoring; and Pain management on her problem list.. Her primarily concern today is the Back Pain    The patient returns to the clinics today after last time being seen 2 years ago. The patient was initially evaluated by me on 08/03/2006. At that time the patient had been referred to [place] by Ashley [name] at [facility]. The patient initially came in with a lumbar radiculitis which was treated with some lumbar epidural steroid injection. After this, the lower extremity radicular pain disappeared and further evaluation revealed that the patient was suffering from bilateral lumbar facet syndrome. On 07/11/2007 the patient underwent her first bilateral L2, L3, L4, L5, and S1 medial branch radiofrequency neurotomy under fluoroscopic guidance and IV sedation. This provided the patient with excellent relief of her pain until 01/14/2009 when the procedure had to be repeated again. Once again this provided the patient with excellent relief of her lower back and leg pain until 08/20/2010 with the procedure once more had to be repeated. The next time that we repeated the procedure  was on 01/18/2013 and since then she has not had any further problems until recently when the pain started coming back. She returns to me to see if we can go ahead and repeat this radiofrequency again. This patient has already undergone all of her diagnostic lumbar facet blocks and physical therapy and at this point we have already proven that this works well for her and we simply have to go ahead and have it repeated. All 4 prior radiofrequencies were done bilaterally and today she indicates that her primary pain is in the lower back with the worse side being the right.  The patient's second worst pain is that of her lower extremities with the right side being worse than the left. In the case that of both lower extremities to pain goes down to the posterior aspect of the knee running through the posterior aspect of the leg. The patient indicates that once in a blue moon she gets a flareup of her left lower extremity pain down to the bottom of the foot with what appears to be an S1 dermatomal distribution. This has not bothered the patient for a while and she indicates that she would like for [place] to work on her lower back.  In addition to this, the patient asked me if I would be willing to take over her medications and I indicated to her that I'll be more than happy to manage those. She indicates that she is still taking the same amount of medicine as when I was taking care of her a couple years ago. However reviewing the sixth 08/09/2013 note, I noticed that what she used to take then was oxymorphone (Opana ER) 15 mg by mouth twice a day plus hydrocodone/APAP 10/325 one tablet  by mouth 3 times a day when necessary for pain. She did return a prescription for oxycodone IR 5 mg 1-2 tablets by mouth 1-4 times a day when necessary for pain, which had been written for her for the post radiofrequency pain. At the time she indicated to me that she would not be needing any more pain medicine. In fact, the last note  reads: "She did not need to use her oxycodone and she returned her prescription to me today, which was voided her pain is down to a 2/10 and she says she does not need anything else for now." Reviewing the [place] PMP, I noticed that the last prescriptions that I wrote for her were on May 2015. However, I also noticed that the following month her primary care physician, Ashley [name], to go over her management and has continued to write for medications.  For now she indicates that she is having these prescribed by her primary care physician, Ashley [name].  Reported Pain Score: 3  Reported level is inconsistent with clinical obrservations. Pain Type: Chronic pain Pain Location: Back Pain Orientation: Lower, Mid Pain Descriptors / Indicators: Constant, Aching, Radiating Pain Frequency: Constant  Onset and Duration: Sudden, Date of onset: 10 years ago, Date of injury: 09/26/2005 and Present longer than 3 months Cause of pain: Work related accident or event Severity: No change since onset, NAS-11 at its worse: 9/10, NAS-11 at its best: 2/10, NAS-11 now: 3/10 and NAS-11 on the average: 6/10 Timing: Not influenced by the time of the day, During activity or exercise, After activity or exercise and After a period of immobility Aggravating Factors: Bending, Lifiting, Prolonged sitting, Prolonged standing, Squatting, Stooping , Twisting and Working Alleviating Factors: Stretching, Cold packs, Hot packs, Lying down, Medications, Nerve blocks, Resting, TENS, Warm showers or baths and Walking Associated Problems: Night-time cramps, Inability to concentrate and Temperature changes Quality of Pain: Aching, Annoying, Constant, Nagging, Uncomfortable and Work related Previous Examinations or Tests: MRI scan, Nerve block, X-rays and Nerve conduction test Previous Treatments: Epidural steroid injections, Facet blocks, Narcotic medications, Pool exercises, Radiofrequency and TENS  Historic Controlled Substance  Pharmacotherapy Review  Previously Prescribed Opioids:  Analgesic: Oxymorphone (Opana ER) 5 mg every 12 hours (3 tablets twice a day) (30 mg/day) plus oxycodone IR 5 mg 1 tablet every 8 hours. (15 mg/day) MME/day: 112.5 mg/day Pharmacokinetics: Onset of action (Liberation/Absorption): Within expected pharmacological parameters. (45 minutes to one hour) Time to Peak effect (Distribution): Timing and results are as within normal expected parameters. (2 hours) Duration of action (Metabolism/Excretion): Within normal limits for medication. (3-4 hours for the oxycodone IR and 4-5 hours for the Opana ER) Pharmacodynamics: Analgesic Effect: 60% for the Opana ER and 40% for the oxycodone IR Activity Facilitation: Medication(s) allow patient to sit, stand, walk, and do the basic ADLs Perceived Effectiveness: Described as relatively effective, allowing for increase in activities of daily living (ADL) Side-effects or Adverse reactions: None reported Historical Background Evaluation: [place] PDMP: Five (5) year initial data search conducted. According to the PMP, the patient has continued on the medications, nonstop, since she was last seen by me.  Historical Hospital-associated UDS Results:  No results found for: THCU, COCAINSCRNUR, PCPSCRNUR, MDMA, AMPHETMU, METHADONE, ETOH UDS Results: No UDS available, at this time UDS Interpretation: No UDS available, at this time Medication Assessment Form: Not applicable. Initial evaluation. The patient has not received any medications from our practice Treatment compliance: Not applicable. Initial evaluation Risk Assessment: Aberrant Behavior: None observed today  Opioid  Fatal Overdose Risk Factors: Concomitant use of benzodiazepines Substance Use Disorder (SUD) Risk Level: Pending results of Medical Psychology Evaluation for SUD Opioid Risk Tool (ORT) Score: Total Score: 3 Low Risk for SUD (Score <3) Depression Scale Score: PHQ-2: PHQ-2 Total Score: 0 No depression  (0) PHQ-9: PHQ-9 Total Score: 0 No depression (0-4)  Pharmacologic Plan: Pending ordered tests and/or consults  Neuromodulation Therapy Review  Type: No neuromodulatory devices implanted Side-effects or Adverse reactions: No device reported Effectiveness: No device reported  Allergies  Ms. Avella is allergic to aspirin; ciprofloxacin; gabapentin; levetiracetam; and tramadol-acetaminophen.  Meds  The patient has a current medication list which includes the following prescription(s): acyclovir, albuterol, alprazolam, bupropion hcl, calcium-vitamin d, diclofenac sodium, fish oil-omega-3 fatty acids, fluticasone, metoclopramide, multivitamin, omeprazole, orphenadrine, oxybutynin, oxycodone, oxymorphone, potassium chloride sa, risperidone, and sumatriptan. Requested Prescriptions    No prescriptions requested or ordered in this encounter    ROS  Cardiovascular History: Negative for hypertension, coronary artery diseas, myocardial infraction, anticoagulant therapy or heart failure Pulmonary or Respiratory History: Asthma, Snoring  and Bronchitis Neurological History: Negative for epilepsy, stroke, urinary or fecal inontinence, spina bifida or tethered cord syndrome Psychological-Psychiatric History: Psychiatric disorder, Anxiety, Depression and Panic Attacks. Bipolar disorder. Gastrointestinal History: Hiatal hernia, Reflux or heatburn and Irritable Bowel Syndrome (IBS) Genitourinary History: Negative for nephrolithiasis, hematuria, renal failure or chronic kidney disease Hematological History: Negative for anticoagulant therapy, anemia, bruising or bleeding easily, hemophilia, sickle cell disease or trait, thrombocytopenia or coagulupathies Endocrine History: Negative for diabetes or thyroid disease Rheumatologic History: Negative for lupus, osteoarthritis, rheumatoid arthritis, myositis, polymyositis or fibromyagia Musculoskeletal History: Negative for myasthenia gravis, muscular dystrophy,  multiple sclerosis or malignant hyperthermia Work History: Working part time in Therapist, art. She indicates been disabled since 1995 due to psychiatric (bipolar) reasons as well as low back pain.  [place]  Medical:  Ms. Belko  has a past medical history of Asthma; Depression; Headache(784.0); Allergy; GERD (gastroesophageal reflux disease); IBS (irritable bowel syndrome); Reflex sympathetic dystrophy; Bipolar disorder ([place]); Fibromyalgia; HTN (hypertension); Chronic lower back pain; and Injury of lower back (09-23-05). Family: family history is not on file. Surgical:  has past surgical history that includes Cholecystectomy; Nasal sinus surgery; left shouder; radiofrequency  ablations april 2010 to lower spine per dr Marjorie Smolder; Esophagogastroduodenoscopy (Sept. 2016); and Colonoscopy (Sept. 2016). Tobacco:  reports that she has never smoked. She has never used smokeless tobacco. Alcohol:  reports that she drinks about 1.2 oz of alcohol per week. Drug:  reports that she does not use illicit drugs.  Physical Exam  Vitals:  Today's Vitals   12/02/15 1301 12/02/15 1302  BP: 139/96   Pulse: 116   Temp: 98.3 F (36.8 C)   Resp: 16   Height: 5\' 2"  (1.575 m)   Weight: 185 lb (83.915 kg)   SpO2: 97%   PainSc: 3  3   PainLoc: Back     Calculated BMI: Body mass index is 33.83 kg/(m^2). Obese (Class I) (30-34.9 kg/m2) - 68% higher incidence of chronic pain  General appearance: alert, cooperative, appears stated age, no distress and mildly obese Eyes: PERLA Respiratory: No evidence respiratory distress, no audible rales or ronchi and no use of accessory muscles of respiration  Cervical Spine Inspection: Normal anatomy Alignment: Symetrical ROM: Adequate  Upper Extremities Inspection: No gross anomalies detected ROM: Adequate Sensory: Normal Motor: Unremarkable  Thoracic Spine Inspection: No gross anomalies detected Alignment: Symetrical ROM: Adequate  Lumbar Spine Inspection: No  gross anomalies detected Alignment: Symetrical ROM: Decreased  Palpation: Tender Provocative Tests: Lumbar Hyperextension and rotation test: Positive bilaterally Patrick's Maneuver: deferred Gait: WNL  Lower Extremities Inspection: No gross anomalies detected ROM: Adequate Sensory: Normal Motor: Unremarkable  Toe walk (S1): WNL  Heal walk (L5): WNL  Assessment  Primary Diagnosis & Pertinent Problem List: The primary encounter diagnosis was Chronic low back pain. Diagnoses of Lumbar spondylosis, unspecified spinal osteoarthritis, Lumbar facet syndrome, Chronic pain, Long term current use of opiate analgesic, Long term prescription opiate use, Opiate use, Encounter for therapeutic drug level monitoring, and Pain management were also pertinent to this visit.  Visit Diagnosis: 1. Chronic low back pain   2. Lumbar spondylosis, unspecified spinal osteoarthritis   3. Lumbar facet syndrome   4. Chronic pain   5. Long term current use of opiate analgesic   6. Long term prescription opiate use   7. Opiate use   8. Encounter for therapeutic drug level monitoring   9. Pain management     Assessment: No problem-specific assessment & plan notes found for this encounter.   Plan of Care  Note: As per protocol, today's visit has been an evaluation only. We have not taken over the patient's controlled substance management.  Pharmacotherapy (Medications Ordered): No orders of the defined types were placed in this encounter.    Lab-work & Procedure Ordered: Orders Placed This Encounter  Procedures  . Radiofrequency,Lumbar    Standing Status: Future     Number of Occurrences:      Standing Expiration Date: 12/01/2016    Scheduling Instructions:     Side(s): Right-sided     Level(s): L2, L3, L4, L5, & S1 Medial Branch Nerves     Sedation: With Sedation.     Timeframe: ASAA    Order Specific Question:  Where will this procedure be performed?    Answer:  [facility]  .  [facility] Drug [facility], Ur    Volume: 30 ml(s). Minimum 3 ml of urine is needed. Document temperature of fresh sample. Indications: Long term (current) use of opiate analgesic (Z79.891) Test#: [id number] (Comprehensive Profile)  . Comprehensive metabolic panel    Standing Status: Future     Number of Occurrences:      Standing Expiration Date: 01/01/2016    Order Specific Question:  Has the patient fasted?    Answer:  No  . C-reactive protein    Standing Status: Future     Number of Occurrences:      Standing Expiration Date: 01/01/2016  . Magnesium    Standing Status: Future     Number of Occurrences:      Standing Expiration Date: 01/01/2016  . Sedimentation rate    Standing Status: Future     Number of Occurrences:      Standing Expiration Date: 01/01/2016  . Vitamin D pnl(25-hydrxy+1,25-dihy)-bld    Standing Status: Future     Number of Occurrences:      Standing Expiration Date: 01/01/2016    Imaging Ordered: None  Interventional Therapies: Scheduled: Palliative, right sided lumbar facet radiofrequency ablation under fluoroscopic guidance and IV sedation, #5. (Last one done on 01/18/2013) PRN Procedures: 2-6 weeks after doing the right side, we'll plan on doing the left.    Referral(s) or Consult(s): None at this time.  Medications administered during this visit: Ms. Ashwell had no medications administered during this visit.  Future Appointments Date Time Provider [place]  01/14/2016 11:30 AM [facility]  02/19/2016 10:45 AM Laurey Morale, MD [facility]  Primary Care Physician: Laurey Morale, MD Location: [facility] Note by: Kathlen Brunswick. Dossie Arbour, M.D, DABA, DABAPM, DABPM, DABIPP, FIPP

## 2015-12-02 NOTE — Progress Notes (Deleted)
Patient's Name: Ashley [name] MRN: [id number] DOB: March 10, 1961 DOS: 12/02/2015  Primary Reason(s) for Visit: {Blank single:19197::"Post-Procedure evaluation","Initial Patient Evaluation","Evaluation of a New Problem","Evaluation of uncontrolled established, chronic problem","Monitoring of two or more stable chronic illnesses","Evaluation of an undiagnosed new problem with uncertain prognosis","Evaluation of one or more chronic illness with mild exacerbation or progression","Encounter for Adjustment & Management of Implanted Device","Encounter for Adjustment & Management of Intrathecal Pump","Encounter for Adjustment & Management of Neurostimulator","Encounter for evaluation before starting a Medication","Encounter for counseling on Nicotine Dependence","Encounter for counseling and surveillance for Substance Use Disorder","Encounter for Medical Counseling","Encounter to establish an Opiate Use Agreement","Encounter for Pre-op Evaluation","Encounter for Therapeutic Drug Level Monitoring","Encounter for Medication Management"} CC: Back Pain   HPI  Ms. Nawabi is a 55 y.o. year old, female patient, who returns today as an established patient. She has HERPES, GENITAL NOS; CANDIDIASIS, ORAL; HYPOKALEMIA; DEPRESSION; STYE; Essential hypertension; ACUTE BRONCHITIS; ALLERGIC RHINITIS; ASTHMA; GERD; IBS; OVERACTIVE BLADDER; LOW BACK PAIN; PLANTAR FASCIITIS; Myalgia and myositis; HEADACHE; ABDOMINAL PAIN, RIGHT LOWER QUADRANT; HTN (hypertension); Acute bacterial sinusitis; Chronic low back pain; Lumbar spondylosis; Lumbar facet syndrome; Chronic pain; Long term current use of opiate analgesic; Long term prescription opiate use; Opiate use; Encounter for therapeutic drug level monitoring; and Pain management on her problem list.. Her primarily concern today is the Back Pain   ***  Pain Assessment: Self-Reported Pain Score: 3  {Blank single:19197::"Clear symptom exaggeration. Reported level of pain is  incompatible with clinical observations","Reported level is inconsistent with clinical obrservations","Reported level is compatible with observation"} Pain Type: Chronic pain Pain Location: Back Pain Orientation: Lower, Mid Pain Descriptors / Indicators: Constant, Aching, Radiating Pain Frequency: Constant  Date of Last Visit:  (may 2 yrs ago had a RF .  GOt 75% relief for over a yea)    Controlled Substance Pharmacotherapy Assessment  Analgesic: *** MME/day: *** Pharmacokinetics: Onset of action (Liberation/Absorption): {Blank single:19197::"Slower than expected. Possible absorption problems","Within expected pharmacological parameters"} Time to Peak effect (Distribution): {Blank single:19197::"Patient describes as insufficient","Some oversedation appears to be evident","Timing and results are as within normal expected parameters"} Duration of action (Metabolism/Excretion): {Blank single:19197::"Shorter than expected. This could suggest rapid metabolism or elimination of the pharmacological agent","Longer than pharmacokineticly expected. This could suggest possible elimination problems","Within normal limits for medication"} Pharmacodynamics: Analgesic Effect: {Blank single:19197::"Less than 50%","At least 50%","More than 50%"} Activity Facilitation: {Blank single:19197::"Patient refers being unable to sit, stand, walk, or do any of the basic ADLs","Medication(s) allow patient to have a normal productive life, including accomplishing all ADL","Medication(s) allow patient to sit, stand, walk, and do the basic ADLs"} Perceived Effectiveness: {Blank single:19197::"None","Described as ineffective and would like to make some changes","Described as relatively effective but with some room for improvement","Described as relatively effective, allowing for increase in activities of daily living (ADL)"} Side-effects or Adverse reactions: {Blank  single:19197::"Minimal","Moderate","Significant","Constipation","Oversedation. This could suggest a drug interaction or accumulation","None reported"} Monitoring: [place] PMP: {Blank A999333 applicable. Initial evaluation","Non-compliant","Abnormal. Results discussed with patient","Compliant with practice rules and regulations"} UDS Results/interpretation: *** Medication Assessment Form: {Blank A999333 applicable. Initial evaluation. The patient has not received any medications from our practice","Discrepancies found between patient's report and information collected","Reviewed. Abnormalities discussed","Reviewed. Patient indicates being compliant with therapy"} Treatment compliance: {Blank A999333 applicable. Initial evaluation","Non-compliant","Deficiencies noted and steps taken to remind the patient of the seriousness of adequate therapy compliance","Non-compliant. Steps taken to remind the patient of the seriousness of adequate therapy compliance","Recurrent non-compliance, despite repeated warnings","Compliant"} Risk Assessment: Aberrant Behavior: {Aberrant Behavior:210120800::"None observed today"} Substance Use Disorder (SUD) Risk Level: {Blank single:19197::"Pending results of Medical Psychology Evaluation  for SUD","Very High","High","High-to-Moderate","Moderate","Moderate-to-Low","Low"} Opioid Risk Tool (ORT) Score: Total Score: 3 {Blank single:19197::"Low Risk for SUD (Score <3)","Moderate Risk for SUD (Score between 4-7)","High Risk for Opioid Abuse (Score >8)","  "} Depression Scale Score: PHQ-2: PHQ-2 Total Score: 0 {Blank single:19197::"No depression (0)","15.4% Probability of major depressive disorder (1)","21.1% Probability of major depressive disorder (2)","38.4% Probability of major depressive disorder (3)","45.5% Probability of major depressive disorder (4)","56.4% Probability of major depressive disorder (5)","78.6% Probability of major depressive disorder  (6)","  "} PHQ-9: PHQ-9 Total Score: 0 {Blank single:19197::"No depression (0-4)","Mild depression (5-9)","Moderate depression (10-14)","Moderately severe depression (15-19)","Severe depression (20-27). (Risk: 2.4 times more likely to use opioids for reasons other than pain control and 2.89 times more likely to use more opioids than prescribed","  "}  Pharmacologic Plan: {Blank single:19197::"Pending ordered tests and/or consults","Therapy adjustment:",""Drug Holiday"","Discontinue opioid therapy","No change in therapy, at this time"}   Laboratory Workup  Last ED UDS: No results found for: THCU, COCAINSCRNUR, PCPSCRNUR, MDMA, AMPHETMU, METHADONE, ETOH  Inflammation Markers No results found for: ESRSEDRATE, CRP  Renal Function Lab Results  Component Value Date   BUN 12 10/09/2015   CREATININE 0.80 10/09/2015   GFRAA 87 09/05/2008   GFRNONAA 71.44 11/12/2009    Hepatic Function Lab Results  Component Value Date   AST 17 10/09/2015   ALT 30 10/09/2015   ALBUMIN 3.8 10/09/2015    Electrolytes Lab Results  Component Value Date   NA 137 10/09/2015   K 3.8 10/09/2015   CL 104 10/09/2015   CALCIUM 9.1 10/09/2015    Implant Therapy Assessment  Intrathecal Pump Therapy: Side-effects or Adverse reactions: {Blank single:19197::"Minimal","Moderate","Significant","Constipation","Oversedation. This could suggest a drug interaction or accumulation","None reported"} Effectiveness: {Blank single:19197::"None","Described as ineffective and would like to make some changes","Described as relatively effective but with some room for improvement","Described as relatively effective, allowing for increase in activities of daily living (ADL)"} Plan: {Blank single:19197::"Pump refill today","Analysis and programming of a 10% increase in rate","Analysis and programming of a 20% increase in rate","Decrease in pump rate","No changes in programming"}  Post-Procedure Assessment  Procedure done on last  visit: *** Side-effects or Adverse reactions: {Blank single:19197::"Transient post-op increase in pain","No significant issues reported","None reported"} Sedation: {Blank single:19197::"Sedation given","No sedation used","Please see nurses note"}  Results:    {Blank single:19197::"Poor patient recollection","Patient did not keep pain diary","No IV Analgesics (Fentanyl) given","No IV Anxiolytics (Midazolam) given","No IV Analgesics or Anxiolytics given, therefore the benefit is completely due to Local Anesthetics","Unexpected non-physiological response","No analgesic relief from Local Anesthetics would suggest pain etiology to reside elsewhere","Partial relief from Local Anesthetics would suggest that the injected area is not 100% responsible for the patient's symptoms","Analgesia during this period is likely to be Local Anesthetic and/or IV Sedative (Analgesic/Anxiolitic) related"}   {Blank single:19197::"Poor patient recollection","Patient did not keep pain diary","No analgesic effect could suggest the source of pain to be elsewhere","Partial relief would suggest incomplete involvement of injected area","Complete relief confirms area to be the source of pain"}   {Blank single:19197::"No benefit could suggest etiology to be non-inflammatory, possibly compressive","Long-term benefit would suggest an inflammatory etiology to the pain"} {Blank single:19197::"Long-term benefit  could reflect a prolonged sympathetic response","Long-term benefit could signal adequate neurolysis","Long-term benefit could signal adequate radiofrequency ablation"," "} Current Relief (Now):  {NUMBERS; 0-100%:23159}  {Blank single:19197::"Recurrance of pain could suggest persistent aggravating factors","No persistent benefit would suggest no long-term anti-inflammatory benefit from intervention and/or persistent or recurrent aggravating factors","Long-term benefit could signal adequate neurolysis","Long-term benefit could signal  adequate radiofrequency ablation","Persistent relief would suggest effective anti-inflammatory effects from steroids"} Interpretation of Results: ***  Allergies  Ms. Nelms is allergic to aspirin; ciprofloxacin; gabapentin; levetiracetam; and tramadol-acetaminophen.  Meds  The patient has a current medication list which includes the following prescription(s): acyclovir, albuterol, alprazolam, bupropion hcl, calcium-vitamin d, diclofenac sodium, fish oil-omega-3 fatty acids, fluticasone, metoclopramide, multivitamin, omeprazole, orphenadrine, oxybutynin, oxycodone, oxymorphone, potassium chloride sa, risperidone, sumatriptan, azithromycin, benzonatate, cefuroxime, fluconazole, hydrocodone-homatropine, nystatin, omeprazole, prednisone, prednisone, and promethazine.  Current Outpatient Prescriptions on File Prior to Visit  Medication Sig  . acyclovir (ZOVIRAX) 400 MG tablet Take 400 mg by mouth daily.    Marland Kitchen albuterol (PROVENTIL HFA;VENTOLIN HFA) 108 (90 BASE) MCG/ACT inhaler Inhale 2 puffs into the lungs every 6 (six) hours as needed for wheezing or shortness of breath.  . alprazolam (XANAX) 2 MG tablet Take 1 tablet (2 mg total) by mouth 3 (three) times daily as needed.  . BuPROPion HCl (WELLBUTRIN PO) Take 150 each by mouth 2 (two) times daily.    . calcium-vitamin D (OSCAL) 250-125 MG-UNIT per tablet Take 1 tablet by mouth daily.    . diclofenac sodium (VOLTAREN) 1 % GEL Apply 1 application topically 4 (four) times daily.  . fish oil-omega-3 fatty acids 1000 MG capsule Take 2 g by mouth daily.    . fluticasone (FLONASE) 50 MCG/ACT nasal spray Place 2 sprays into both nostrils daily.  . metoCLOPramide (REGLAN) 5 MG tablet Take 5 mg by mouth 3 (three) times daily before meals.   . Multiple Vitamin (MULTIVITAMIN) capsule Take 1 capsule by mouth daily.    . orphenadrine (NORFLEX) 100 MG tablet Take 1 tablet (100 mg total) by mouth 2 (two) times daily.  Marland Kitchen oxybutynin (DITROPAN) 5 MG tablet TAKE 1  TABLET BY MOUTH TWICE A DAY  . oxyCODONE (OXY IR/ROXICODONE) 5 MG immediate release tablet Take 1 tablet (5 mg total) by mouth every 8 (eight) hours as needed for severe pain.  Marland Kitchen oxymorphone (OPANA ER) 5 MG 12 hr tablet Takes 3 tabs two times daily  . potassium chloride SA (KLOR-CON M20) 20 MEQ tablet TAKE 1 TABLET (20 MEQ TOTAL) BY MOUTH 2 (TWO) TIMES DAILY.  Marland Kitchen risperiDONE (RISPERDAL) 2 MG tablet Take 2 mg by mouth daily.    . SUMAtriptan (IMITREX) 100 MG tablet Take 1 tablet (100 mg total) by mouth as needed for migraine.  . cefUROXime (CEFTIN) 500 MG tablet Take 1 tablet (500 mg total) by mouth 2 (two) times daily with a meal. (Patient not taking: Reported on 10/16/2015)  . fluconazole (DIFLUCAN) 150 MG tablet Take 1 tablet (150 mg total) by mouth as needed. (Patient not taking: Reported on 11/19/2015)  . HYDROcodone-homatropine (HYCODAN) 5-1.5 MG/5ML syrup Take 5 mLs by mouth every 6 (six) hours as needed for cough. (Patient not taking: Reported on 11/19/2015)  . nystatin (MYCOSTATIN) 100000 UNIT/ML suspension Take 5 mLs (500,000 Units total) by mouth 4 (four) times daily. (Patient not taking: Reported on 02/18/2015)  . omeprazole (PRILOSEC) 20 MG capsule Take 20 mg by mouth 2 (two) times daily before a meal. Reported on 12/02/2015  . predniSONE (DELTASONE) 50 MG tablet Take 1 tablet (50 mg total) by mouth daily. (Patient not taking: Reported on 11/18/2014)  . promethazine (PHENERGAN) 25 MG tablet Take 1 tablet (25 mg total) by mouth every 6 (six) hours as needed for nausea or vomiting. (Patient not taking: Reported on 05/20/2015)   No current facility-administered medications on file prior to visit.    ROS  Constitutional: {Blank multiple:19196::"Afebrile","no chills","well hydrated","well nourished"} Gastrointestinal: {Findings; ROS gastrointestinal:30513::"negative"} Musculoskeletal:{Findings; ROS musculoskeletal:30524::"negative"} Neurological: {Findings; ROS  neuro:30532::"negative"} Behavioral/Psych: {Findings; ROS psychiatric:30534::"negative"}  PFSH  Medical:  Ms. Biesecker  has a past medical history of Asthma; Depression; Headache(784.0); Allergy; GERD (gastroesophageal reflux disease); IBS (irritable bowel syndrome); Reflex sympathetic dystrophy; Bipolar disorder ([place]); Fibromyalgia; HTN (hypertension); Chronic lower back pain; and Injury of lower back (09-23-05). Family: family history is not on file. Surgical:  has past surgical history that includes Cholecystectomy; Nasal sinus surgery; left shouder; radiofrequency  ablations april 2010 to lower spine per dr Marjorie Smolder; Esophagogastroduodenoscopy (Sept. 2016); and Colonoscopy (Sept. 2016). Tobacco:  reports that she has never smoked. She has never used smokeless tobacco. Alcohol:  reports that she drinks about 1.2 oz of alcohol per week. Drug:  reports that she does not use illicit drugs.  Physical Exam  Vitals:  Today's Vitals   12/02/15 1301 12/02/15 1302  BP: 139/96   Pulse: 116   Temp: 98.3 F (36.8 C)   Resp: 16   Height: 5\' 2"  (1.575 m)   Weight: 185 lb (83.915 kg)   SpO2: 97%   PainSc: 3  3   PainLoc: Back     Calculated BMI: Body mass index is 33.83 kg/(m^2). {Blank single:19197::"Overweight (25-29.9 kg/m2) - 20% higher incidence of chronic pain","Obese (Class I) (30-34.9 kg/m2) - 68% higher incidence of chronic pain","Severe obesity (Class II) (35-39.9 kg/m2) - 136% higher incidence of chronic pain","Extreme obesity (Class III) (>40 kg/m2) - 254% higher incidence of chronic pain","  "}  General appearance: {general exam:16600} Eyes: {Blank single:19197::"Pin-point pupils","PERLA"} Respiratory: {Blank multiple:19196::"No evidence respiratory distress","no audible rales or ronchi","no use of accessory muscles of respiration"}  Cervical Spine Inspection: {Blank single:19197::"Normal anatomy"} Alignment: {Blank single:19197::"Asymetric","Symetrical"} ROM: {Blank  single:19197::"Decreased","Restricted","Full ROM","Grossly normal","Adequate"}  Upper Extremities Inspection: {Blank single:19197::"No gross anomalies detected"} ROM: {Blank single:19197::"Decreased","Restricted","Full ROM","Grossly normal","Adequate"} Sensory: {Blank single:19197::"Pain","Absent","Impaired","Dysesthesias","Allodynia","Anesthesia","Hyperesthesia","Hyperpathia","No sensory abnormalities","Normal"} Motor: {Blank single:19197::"Abnormal","Weakness","Guarding","5/5","Grossly intact","Unremarkable"}  Thoracic Spine Inspection: {Blank single:19197::"No gross anomalies detected"} Alignment: {Blank single:19197::"Asymetric","Symetrical"} ROM: {Blank single:19197::"Decreased","Restricted","Full ROM","Grossly normal","Adequate"}  Lumbar Spine Inspection: {Blank single:19197::"No gross anomalies detected"} Alignment: {Blank single:19197::"Asymetric","Symetrical"} ROM: {Blank single:19197::"Decreased","Restricted","Full ROM","Grossly normal","Adequate"}  Gait: {Blank single:19197::"Antalgic (limping)","Normal","Abnormal","Charlie"Chaplin (due to tibial torsion)","Circumduction gait (due to hemiplegia)","Waddling (Hip pathology)","High stepping gait (foot drop)","Scissor gait (cerebral palsy)","Stiff hip gait (hip ankylosis)","Trendelenburg (unstable hip)","Ataxia","Dystaxia","Hemiataxia","Friedreich's ataxia","Shuffling gait","Compensatory","Patient comes in today in a wheel chair","WNL"}  Lower Extremities Inspection: {Blank single:19197::"No gross anomalies detected"} ROM: {Blank single:19197::"Decreased","Restricted","Full ROM","Grossly normal","Adequate"} Sensory:  {Blank single:19197::"Pain","Absent","Impaired","Dysesthesias","Allodynia","Anesthesia","Hyperesthesia","Hyperpathia","No sensory abnormalities","Normal"} Motor: {Blank single:19197::"Abnormal","Weakness","Guarding","5/5","Grossly intact","Unremarkable"}  Assessment & Plan  Primary Diagnosis & Pertinent Problem  List: The primary encounter diagnosis was Chronic low back pain. Diagnoses of Lumbar spondylosis, unspecified spinal osteoarthritis, Lumbar facet syndrome, Chronic pain, Long term current use of opiate analgesic, Long term prescription opiate use, Opiate use, Encounter for therapeutic drug level monitoring, and Pain management were also pertinent to this visit.  Visit Diagnosis: 1. Chronic low back pain   2. Lumbar spondylosis, unspecified spinal osteoarthritis   3. Lumbar facet syndrome   4. Chronic pain   5. Long term current use of opiate analgesic   6. Long term prescription opiate use   7. Opiate use   8. Encounter for therapeutic drug level monitoring   9. Pain management     Problem-specific Plan(s): No problem-specific assessment & plan notes found for this encounter.   Plan of Care  Pharmacotherapy (Medications Ordered): No orders of the defined types were placed in this encounter.    Lab-work & Procedure Ordered: Orders Placed This Encounter  Procedures  . Radiofrequency,Lumbar    Standing Status: Future     Number of Occurrences:  Standing Expiration Date: 12/01/2016    Scheduling Instructions:     Side(s): Right-sided     Level(s): L2, L3, L4, L5, & S1 Medial Branch Nerves     Sedation: With Sedation.     Timeframe: ASAA    Order Specific Question:  Where will this procedure be performed?    Answer:  [facility]  . [facility] Drug Analysis, Ur    Volume: 30 ml(s). Minimum 3 ml of urine is needed. Document temperature of fresh sample. Indications: Long term (current) use of opiate analgesic (Z79.891) Test#: [id number] (Comprehensive Profile)  . Comprehensive metabolic panel    Standing Status: Future     Number of Occurrences:      Standing Expiration Date: 01/01/2016    Order Specific Question:  Has the patient fasted?    Answer:  No  . C-reactive protein    Standing Status: Future     Number of Occurrences:      Standing Expiration Date:  01/01/2016  . Magnesium    Standing Status: Future     Number of Occurrences:      Standing Expiration Date: 01/01/2016  . Sedimentation rate    Standing Status: Future     Number of Occurrences:      Standing Expiration Date: 01/01/2016  . Vitamin D pnl(25-hydrxy+1,25-dihy)-bld    Standing Status: Future     Number of Occurrences:      Standing Expiration Date: 01/01/2016    Imaging Ordered: None  Interventional Therapies: Scheduled: Right sided Palliative lumbar facet radiofrequency ablation under fluoroscopic guidance and IV sedation. PRN Procedures: 2-6 weeks after the initial radiofrequency on the right side will bring her back to do the left side.    Referral(s) or Consult(s): None at this time.  Medications administered during this visit: Ms. Paneto does not currently have medications on file.  Future Appointments Date Time Provider [place]  01/14/2016 11:30 AM [facility]  02/19/2016 10:45 AM Laurey Morale, MD [facility]    Primary Care Physician: Laurey Morale, MD Location: [facility] Note by: Kathlen Brunswick. Dossie Arbour, M.D, DABA, DABAPM, DABPM, DABIPP, FIPP  Pain Score Disclaimer: We use the NRS-11 scale. This is a self-reported, subjective measurement of pain severity with only modest accuracy. It is used primarily to identify changes within a particular patient. It must be understood that outpatient pain scales are significantly less accurate that those used for research, where they can be applied under ideal controlled circumstances with minimal exposure to variables. In reality, the score is likely to be a combination of pain intensity and pain affect, where pain affect describes the degree of emotional arousal or changes in action readiness caused by the sensory experience of pain. Factors such as social and work situation, setting, emotional state, anxiety levels, expectation, and prior pain experience  may influence pain perception and show large inter-individual differences that may also be affected by time variables.

## 2015-12-02 NOTE — Progress Notes (Signed)
Safety precautions to be maintained throughout the outpatient stay will include: orient to surroundings, keep bed in low position, maintain call bell within reach at all times, provide assistance with transfer out of bed and ambulation.  

## 2015-12-04 ENCOUNTER — Telehealth: Payer: Self-pay | Admitting: Pain Medicine

## 2015-12-04 NOTE — Telephone Encounter (Signed)
Patient wants to know if she can have RFA bilateral done the same week, she states that she has a work note for 1 week to recover and she doesn't want to miss more work than she has too. Order entered is for right side RFA. Please let me (Angie) know so that I can proceed with the request for authorization from Workers Comp and let the patient know also.

## 2015-12-05 NOTE — Telephone Encounter (Signed)
Message left to call office

## 2015-12-06 LAB — COMPLIANCE DRUG ANALYSIS, UR: PDF: 0

## 2015-12-10 DIAGNOSIS — N950 Postmenopausal bleeding: Secondary | ICD-10-CM | POA: Diagnosis not present

## 2015-12-15 ENCOUNTER — Telehealth: Payer: Self-pay

## 2015-12-15 NOTE — Telephone Encounter (Signed)
Ashley [name] will you follow up on this so that I can request WC approval  Thank you :-)

## 2015-12-15 NOTE — Telephone Encounter (Signed)
Patient notified

## 2015-12-16 ENCOUNTER — Telehealth: Payer: Self-pay | Admitting: Pain Medicine

## 2015-12-16 NOTE — Telephone Encounter (Signed)
Request for authorization for right sided RFA and 12/02/2015 note to [facility]  Delay due to patient question to MD and Nonnie Done concerning RFA - Kori spoke with patient  Per conversation yesterday - okay to send request for auth as above

## 2015-12-26 DIAGNOSIS — H524 Presbyopia: Secondary | ICD-10-CM | POA: Diagnosis not present

## 2015-12-26 DIAGNOSIS — H02821 Cysts of right upper eyelid: Secondary | ICD-10-CM | POA: Diagnosis not present

## 2016-01-14 ENCOUNTER — Other Ambulatory Visit (INDEPENDENT_AMBULATORY_CARE_PROVIDER_SITE_OTHER): Payer: PPO

## 2016-01-14 DIAGNOSIS — E785 Hyperlipidemia, unspecified: Secondary | ICD-10-CM

## 2016-01-14 LAB — LIPID PANEL
CHOL/HDL RATIO: 5
CHOLESTEROL: 205 mg/dL — AB (ref 0–200)
HDL: 42.1 mg/dL (ref >39.00)
NonHDL: 162.98
TRIGLYCERIDES: 246 mg/dL — AB (ref 0.0–149.0)
VLDL: 49.2 mg/dL — ABNORMAL HIGH (ref 0.0–40.0)

## 2016-01-14 LAB — LDL CHOLESTEROL, DIRECT: Direct LDL: 117 mg/dL

## 2016-01-19 DIAGNOSIS — H04123 Dry eye syndrome of bilateral lacrimal glands: Secondary | ICD-10-CM | POA: Diagnosis not present

## 2016-01-19 DIAGNOSIS — H16223 Keratoconjunctivitis sicca, not specified as Sjogren's, bilateral: Secondary | ICD-10-CM | POA: Diagnosis not present

## 2016-01-19 HISTORY — PX: OTHER SURGICAL HISTORY: SHX169

## 2016-01-21 ENCOUNTER — Ambulatory Visit: Payer: Self-pay | Admitting: Podiatry

## 2016-01-23 ENCOUNTER — Ambulatory Visit (INDEPENDENT_AMBULATORY_CARE_PROVIDER_SITE_OTHER): Payer: PPO

## 2016-01-23 ENCOUNTER — Ambulatory Visit (INDEPENDENT_AMBULATORY_CARE_PROVIDER_SITE_OTHER): Payer: PPO | Admitting: Podiatry

## 2016-01-23 ENCOUNTER — Encounter: Payer: Self-pay | Admitting: Podiatry

## 2016-01-23 VITALS — BP 117/87 | HR 98 | Resp 12

## 2016-01-23 DIAGNOSIS — L600 Ingrowing nail: Secondary | ICD-10-CM

## 2016-01-23 DIAGNOSIS — M722 Plantar fascial fibromatosis: Secondary | ICD-10-CM | POA: Diagnosis not present

## 2016-01-23 MED ORDER — TRIAMCINOLONE ACETONIDE 10 MG/ML IJ SUSP
10.0000 mg | Freq: Once | INTRAMUSCULAR | Status: AC
  Administered 2016-01-23: 10 mg

## 2016-01-23 NOTE — Patient Instructions (Signed)
Plantar Fasciitis Plantar fasciitis is a painful foot condition that affects the heel. It occurs when the band of tissue that connects the toes to the heel bone (plantar fascia) becomes irritated. This can happen after exercising too much or doing other repetitive activities (overuse injury). The pain from plantar fasciitis can range from mild irritation to severe pain that makes it difficult for you to walk or move. The pain is usually worse in the morning or after you have been sitting or lying down for a while. CAUSES This condition may be caused by:  Standing for long periods of time.  Wearing shoes that do not fit.  Doing high-impact activities, including running, aerobics, and ballet.  Being overweight.  Having an abnormal way of walking (gait).  Having tight calf muscles.  Having high arches in your feet.  Starting a new athletic activity. SYMPTOMS The main symptom of this condition is heel pain. Other symptoms include:  Pain that gets worse after activity or exercise.  Pain that is worse in the morning or after resting.  Pain that goes away after you walk for a few minutes. DIAGNOSIS This condition may be diagnosed based on your signs and symptoms. Your health care provider will also do a physical exam to check for:  A tender area on the bottom of your foot.  A high arch in your foot.  Pain when you move your foot.  Difficulty moving your foot. You may also need to have imaging studies to confirm the diagnosis. These can include:  X-rays.  Ultrasound.  MRI. TREATMENT  Treatment for plantar fasciitis depends on the severity of the condition. Your treatment may include:  Rest, ice, and over-the-counter pain medicines to manage your pain.  Exercises to stretch your calves and your plantar fascia.  A splint that holds your foot in a stretched, upward position while you sleep (night splint).  Physical therapy to relieve symptoms and prevent problems in the  future.  Cortisone injections to relieve severe pain.  Extracorporeal shock wave therapy (ESWT) to stimulate damaged plantar fascia with electrical impulses. It is often used as a last resort before surgery.  Surgery, if other treatments have not worked after 12 months. HOME CARE INSTRUCTIONS  Take medicines only as directed by your health care provider.  Avoid activities that cause pain.  Roll the bottom of your foot over a bag of ice or a bottle of cold water. Do this for 20 minutes, 3-4 times a day.  Perform simple stretches as directed by your health care provider.  Try wearing athletic shoes with air-sole or gel-sole cushions or soft shoe inserts.  Wear a night splint while sleeping, if directed by your health care provider.  Keep all follow-up appointments with your health care provider. PREVENTION   Do not perform exercises or activities that cause heel pain.  Consider finding low-impact activities if you continue to have problems.  Lose weight if you need to. The best way to prevent plantar fasciitis is to avoid the activities that aggravate your plantar fascia. SEEK MEDICAL CARE IF:  Your symptoms do not go away after treatment with home care measures.  Your pain gets worse.  Your pain affects your ability to move or do your daily activities.   This information is not intended to replace advice given to you by your health care provider. Make sure you discuss any questions you have with your health care provider.   Document Released: 06/01/2001 Document Revised: 05/28/2015 Document Reviewed: 07/17/2014 [employer]   Interactive Patient Education 2016 [employer]  

## 2016-01-23 NOTE — Progress Notes (Signed)
   Subjective:    Patient ID: Ashley [name], female    DOB: 1961/01/27, 55 y.o.   MRN: [id number]  HPI  Chief Complaint  Patient presents with  . Plantar Fasciitis    ''rt foot bottom of the heel is painful.''   PT STATED RT BOTTOM OF THE HEEL IS BEEN HURTING FOR 1 MONTHS. HEEL GET WORSE THE END OF THE DAY. TRIED TAKING NSAID, ICE-NO RELIEF.  Review of Systems  Eyes: Positive for visual disturbance.  Genitourinary: Positive for urgency.  Musculoskeletal: Positive for back pain.       Objective:   Physical Exam        Assessment & Plan:

## 2016-01-25 NOTE — Progress Notes (Signed)
Subjective:     Patient ID: Ashley [name], female   DOB: [birth year], 55 y.o.   MRN: [id number]  HPI patient presents stating she's getting a lot of pain in her right heel and also her left second nail bed has become sore on the top of the toenail. States the heels been hurting for several months and makes it hard to walk   Review of Systems  All other systems reviewed and are negative.      Objective:   Physical Exam  Constitutional: She is oriented to person, place, and time.  Cardiovascular: Intact distal pulses.   Musculoskeletal: Normal range of motion.  Neurological: She is oriented to person, place, and time.  Skin: Skin is warm.  Nursing note and vitals reviewed.  Neurovascular status intact muscle strength adequate with inflamed changes in the right plantar heel at the insertion into the calcaneus with incurvated painful toenail second left also noted. Patient has good digital perfusion and is well oriented 3    Assessment:     Plantar fasciitis right heel with ingrown toenail deformity left second nail that's painful    Plan:     H&P and x-rays reviewed with patient. Today I went ahead and injected the right plantar fascia 3 mg Kenalog 5 mg Xylocaine and discussed at next visit removal of the nail corner or entire nail depending on how it is feeling. Patient will be seen back for [place] to recheck and fascial brace dispensed today along with instructions on physical therapy

## 2016-01-30 ENCOUNTER — Encounter: Payer: Self-pay | Admitting: Podiatry

## 2016-01-30 ENCOUNTER — Ambulatory Visit (INDEPENDENT_AMBULATORY_CARE_PROVIDER_SITE_OTHER): Payer: PPO | Admitting: Podiatry

## 2016-01-30 DIAGNOSIS — M722 Plantar fascial fibromatosis: Secondary | ICD-10-CM | POA: Diagnosis not present

## 2016-01-30 DIAGNOSIS — L600 Ingrowing nail: Secondary | ICD-10-CM | POA: Diagnosis not present

## 2016-01-30 MED ORDER — TRIAMCINOLONE ACETONIDE 10 MG/ML IJ SUSP
10.0000 mg | Freq: Once | INTRAMUSCULAR | Status: AC
  Administered 2016-01-30: 10 mg

## 2016-01-30 NOTE — Progress Notes (Signed)
Subjective:     Patient ID: Ashley [name], female   DOB: 03-19-1961, 55 y.o.   MRN: [id number]  HPI patient presents stating I'm ready to have this nail taken off on my left and on the right one I do have pain on the outside of the heel with the inside feeling better   Review of Systems     Objective:   Physical Exam Neurovascular status intact with incurvated damaged second nail left with the right plantar lateral heel been very tender when pressed. Patient has no other pathology currently    Assessment:     Ingrown toenail with deformity left second nail and plantar fasciitis lateral side right heel    Plan:     H&P and both conditions reviewed. Today I went ahead and I recommended removal of the nail I explained procedure and risk and patient wants surgery done and I infiltrated the left second toe 60 mg I can Marcaine mixture removed the second nail exposed matrix and applied phenol 3 applications 30 seconds and sterile dressing. For the right one and I went ahead and I injected the plantar lateral heel 3 mg Kenalog 5 mill grams Xylocaine

## 2016-01-30 NOTE — Patient Instructions (Addendum)
Plantar Fasciitis (Heel Spur Syndrome) with Rehab The plantar fascia is a fibrous, ligament-like, soft-tissue structure that spans the bottom of the foot. Plantar fasciitis is a condition that causes pain in the foot due to inflammation of the tissue. SYMPTOMS   Pain and tenderness on the underneath side of the foot.  Pain that worsens with standing or walking. CAUSES  Plantar fasciitis is caused by irritation and injury to the plantar fascia on the underneath side of the foot. Common mechanisms of injury include:  Direct trauma to bottom of the foot.  Damage to a small nerve that runs under the foot where the main fascia attaches to the heel bone.  Stress placed on the plantar fascia due to bone spurs. RISK INCREASES WITH:   Activities that place stress on the plantar fascia (running, jumping, pivoting, or cutting).  Poor strength and flexibility.  Improperly fitted shoes.  Tight calf muscles.  Flat feet.  Failure to warm-up properly before activity.  Obesity. PREVENTION  Warm up and stretch properly before activity.  Allow for adequate recovery between workouts.  Maintain physical fitness:  Strength, flexibility, and endurance.  Cardiovascular fitness.  Maintain a health body weight.  Avoid stress on the plantar fascia.  Wear properly fitted shoes, including arch supports for individuals who have flat feet.  PROGNOSIS  If treated properly, then the symptoms of plantar fasciitis usually resolve without surgery. However, occasionally surgery is necessary.  RELATED COMPLICATIONS   Recurrent symptoms that may result in a chronic condition.  Problems of the lower back that are caused by compensating for the injury, such as limping.  Pain or weakness of the foot during push-off following surgery.  Chronic inflammation, scarring, and partial or complete fascia tear, occurring more often from repeated injections.  TREATMENT  Treatment initially involves the  use of ice and medication to help reduce pain and inflammation. The use of strengthening and stretching exercises may help reduce pain with activity, especially stretches of the Achilles tendon. These exercises may be performed at home or with a therapist. Your caregiver may recommend that you use heel cups of arch supports to help reduce stress on the plantar fascia. Occasionally, corticosteroid injections are given to reduce inflammation. If symptoms persist for greater than 6 months despite non-surgical (conservative), then surgery may be recommended.   MEDICATION   If pain medication is necessary, then nonsteroidal anti-inflammatory medications, such as aspirin and ibuprofen, or other minor pain relievers, such as acetaminophen, are often recommended.  Do not take pain medication within 7 days before surgery.  Prescription pain relievers may be given if deemed necessary by your caregiver. Use only as directed and only as much as you need.  Corticosteroid injections may be given by your caregiver. These injections should be reserved for the most serious cases, because they may only be given a certain number of times.  HEAT AND COLD  Cold treatment (icing) relieves pain and reduces inflammation. Cold treatment should be applied for 10 to 15 minutes every 2 to 3 hours for inflammation and pain and immediately after any activity that aggravates your symptoms. Use ice packs or massage the area with a piece of ice (ice massage).  Heat treatment may be used prior to performing the stretching and strengthening activities prescribed by your caregiver, physical therapist, or athletic trainer. Use a heat pack or soak the injury in warm water.  SEEK IMMEDIATE MEDICAL CARE IF:  Treatment seems to offer no benefit, or the condition worsens.  Any medications   produce adverse side effects.  EXERCISES- RANGE OF MOTION (ROM) AND STRETCHING EXERCISES - Plantar Fasciitis (Heel Spur Syndrome) These exercises  may help you when beginning to rehabilitate your injury. Your symptoms may resolve with or without further involvement from your physician, physical therapist or athletic trainer. While completing these exercises, remember:   Restoring tissue flexibility helps normal motion to return to the joints. This allows healthier, less painful movement and activity.  An effective stretch should be held for at least 30 seconds.  A stretch should never be painful. You should only feel a gentle lengthening or release in the stretched tissue.  RANGE OF MOTION - Toe Extension, Flexion  Sit with your right / left leg crossed over your opposite knee.  Grasp your toes and gently pull them back toward the top of your foot. You should feel a stretch on the bottom of your toes and/or foot.  Hold this stretch for 10 seconds.  Now, gently pull your toes toward the bottom of your foot. You should feel a stretch on the top of your toes and or foot.  Hold this stretch for 10 seconds. Repeat  times. Complete this stretch 3 times per day.   RANGE OF MOTION - Ankle Dorsiflexion, Active Assisted  Remove shoes and sit on a chair that is preferably not on a carpeted surface.  Place right / left foot under knee. Extend your opposite leg for support.  Keeping your heel down, slide your right / left foot back toward the chair until you feel a stretch at your ankle or calf. If you do not feel a stretch, slide your bottom forward to the edge of the chair, while still keeping your heel down.  Hold this stretch for 10 seconds. Repeat 3 times. Complete this stretch 2 times per day.   STRETCH  Gastroc, Standing  Place hands on wall.  Extend right / left leg, keeping the front knee somewhat bent.  Slightly point your toes inward on your back foot.  Keeping your right / left heel on the floor and your knee straight, shift your weight toward the wall, not allowing your back to arch.  You should feel a gentle stretch  in the right / left calf. Hold this position for 10 seconds. Repeat 3 times. Complete this stretch 2 times per day.  STRETCH  Soleus, Standing  Place hands on wall.  Extend right / left leg, keeping the other knee somewhat bent.  Slightly point your toes inward on your back foot.  Keep your right / left heel on the floor, bend your back knee, and slightly shift your weight over the back leg so that you feel a gentle stretch deep in your back calf.  Hold this position for 10 seconds. Repeat 3 times. Complete this stretch 2 times per day.  STRETCH  Gastrocsoleus, Standing  Note: This exercise can place a lot of stress on your foot and ankle. Please complete this exercise only if specifically instructed by your caregiver.   Place the ball of your right / left foot on a step, keeping your other foot firmly on the same step.  Hold on to the wall or a rail for balance.  Slowly lift your other foot, allowing your body weight to press your heel down over the edge of the step.  You should feel a stretch in your right / left calf.  Hold this position for 10 seconds.  Repeat this exercise with a slight bend in your right /   left knee. Repeat 3 times. Complete this stretch 2 times per day.   STRENGTHENING EXERCISES - Plantar Fasciitis (Heel Spur Syndrome)  These exercises may help you when beginning to rehabilitate your injury. They may resolve your symptoms with or without further involvement from your physician, physical therapist or athletic trainer. While completing these exercises, remember:   Muscles can gain both the endurance and the strength needed for everyday activities through controlled exercises.  Complete these exercises as instructed by your physician, physical therapist or athletic trainer. Progress the resistance and repetitions only as guided.  STRENGTH - Towel Curls  Sit in a chair positioned on a non-carpeted surface.  Place your foot on a towel, keeping your heel  on the floor.  Pull the towel toward your heel by only curling your toes. Keep your heel on the floor. Repeat 3 times. Complete this exercise 2 times per day.  STRENGTH - Ankle Inversion  Secure one end of a rubber exercise band/tubing to a fixed object (table, pole). Loop the other end around your foot just before your toes.  Place your fists between your knees. This will focus your strengthening at your ankle.  Slowly, pull your big toe up and in, making sure the band/tubing is positioned to resist the entire motion.  Hold this position for 10 seconds.  Have your muscles resist the band/tubing as it slowly pulls your foot back to the starting position. Repeat 3 times. Complete this exercises 2 times per day.  Document Released: 09/06/2005 Document Revised: 11/29/2011 Document Reviewed: 12/19/2008 [facility] Patient Information 2014 [place], [place].    ANTIBACTERIAL SOAP INSTRUCTIONS  THE DAY AFTER PROCEDURE  Please follow the instructions your doctor has marked.   Shower as usual. Before getting out, place a drop of antibacterial liquid soap (Dial) on a wet, clean washcloth.  Gently wipe washcloth over affected area.  Afterward, rinse the area with warm water.  Blot the area dry with a soft cloth and cover with antibiotic ointment (neosporin, polysporin, bacitracin) and band aid or gauze and tape  Place 3-4 drops of antibacterial liquid soap in a quart of warm tap water.  Submerge foot into water for 20 minutes.  If bandage was applied after your procedure, leave on to allow for easy lift off, then remove and continue with soak for the remaining time.  Next, blot area dry with a soft cloth and cover with a bandage.  Apply other medications as directed by your doctor, such as cortisporin otic solution (eardrops) or neosporin antibiotic ointment  Soak toe/toes for approximately 1-2 weeks, allow affected toe to air dry periodically throughout the day when not wearing shoes. When wearing  a shoe, apply neosporin as needed with a light bandage to protect the nail bed.  Leave original bandage ( this is the big blue bandage you leave the office wearing) in place for 24 hours if tolerable. Begin first soak after the 24 hour period. If severe pain or throbbing occurs you can begin your first soak before the 24 hour period. Submerge toe with bandages on into soapy water to allow bandages to loosen, then remove bandages. Allow area to air dry before dressing with a bandage.  Drainage for approximately 2-4 weeks is normal,  Discomfort to area is to be expected. Tylenol or ibuprofen can be taken as directed if tolerated.  Any severe pain, blistering, increased swelling or redness needs to be evaluated, please call the office with any questions or concerns Plantar Fasciitis (Heel Spur Syndrome) with  Rehab The plantar fascia is a fibrous, ligament-like, soft-tissue structure that spans the bottom of the foot. Plantar fasciitis is a condition that causes pain in the foot due to inflammation of the tissue. SYMPTOMS   Pain and tenderness on the underneath side of the foot.  Pain that worsens with standing or walking. CAUSES  Plantar fasciitis is caused by irritation and injury to the plantar fascia on the underneath side of the foot. Common mechanisms of injury include:  Direct trauma to bottom of the foot.  Damage to a small nerve that runs under the foot where the main fascia attaches to the heel bone.  Stress placed on the plantar fascia due to bone spurs. RISK INCREASES WITH:   Activities that place stress on the plantar fascia (running, jumping, pivoting, or cutting).  Poor strength and flexibility.  Improperly fitted shoes.  Tight calf muscles.  Flat feet.  Failure to warm-up properly before activity.  Obesity. PREVENTION  Warm up and stretch properly before activity.  Allow for adequate recovery between workouts.  Maintain physical fitness:  Strength, flexibility,  and endurance.  Cardiovascular fitness.  Maintain a health body weight.  Avoid stress on the plantar fascia.  Wear properly fitted shoes, including arch supports for individuals who have flat feet.  PROGNOSIS  If treated properly, then the symptoms of plantar fasciitis usually resolve without surgery. However, occasionally surgery is necessary.  RELATED COMPLICATIONS   Recurrent symptoms that may result in a chronic condition.  Problems of the lower back that are caused by compensating for the injury, such as limping.  Pain or weakness of the foot during push-off following surgery.  Chronic inflammation, scarring, and partial or complete fascia tear, occurring more often from repeated injections.  TREATMENT  Treatment initially involves the use of ice and medication to help reduce pain and inflammation. The use of strengthening and stretching exercises may help reduce pain with activity, especially stretches of the Achilles tendon. These exercises may be performed at home or with a therapist. Your caregiver may recommend that you use heel cups of arch supports to help reduce stress on the plantar fascia. Occasionally, corticosteroid injections are given to reduce inflammation. If symptoms persist for greater than 6 months despite non-surgical (conservative), then surgery may be recommended.   MEDICATION   If pain medication is necessary, then nonsteroidal anti-inflammatory medications, such as aspirin and ibuprofen, or other minor pain relievers, such as acetaminophen, are often recommended.  Do not take pain medication within 7 days before surgery.  Prescription pain relievers may be given if deemed necessary by your caregiver. Use only as directed and only as much as you need.  Corticosteroid injections may be given by your caregiver. These injections should be reserved for the most serious cases, because they may only be given a certain number of times.  HEAT AND COLD  Cold  treatment (icing) relieves pain and reduces inflammation. Cold treatment should be applied for 10 to 15 minutes every 2 to 3 hours for inflammation and pain and immediately after any activity that aggravates your symptoms. Use ice packs or massage the area with a piece of ice (ice massage).  Heat treatment may be used prior to performing the stretching and strengthening activities prescribed by your caregiver, physical therapist, or athletic trainer. Use a heat pack or soak the injury in warm water.  SEEK IMMEDIATE MEDICAL CARE IF:  Treatment seems to offer no benefit, or the condition worsens.  Any medications produce adverse side effects.  EXERCISES- RANGE OF MOTION (ROM) AND STRETCHING EXERCISES - Plantar Fasciitis (Heel Spur Syndrome) These exercises may help you when beginning to rehabilitate your injury. Your symptoms may resolve with or without further involvement from your physician, physical therapist or athletic trainer. While completing these exercises, remember:   Restoring tissue flexibility helps normal motion to return to the joints. This allows healthier, less painful movement and activity.  An effective stretch should be held for at least 30 seconds.  A stretch should never be painful. You should only feel a gentle lengthening or release in the stretched tissue.  RANGE OF MOTION - Toe Extension, Flexion  Sit with your right / left leg crossed over your opposite knee.  Grasp your toes and gently pull them back toward the top of your foot. You should feel a stretch on the bottom of your toes and/or foot.  Hold this stretch for 10 seconds.  Now, gently pull your toes toward the bottom of your foot. You should feel a stretch on the top of your toes and or foot.  Hold this stretch for 10 seconds. Repeat  times. Complete this stretch 3 times per day.   RANGE OF MOTION - Ankle Dorsiflexion, Active Assisted  Remove shoes and sit on a chair that is preferably not on a  carpeted surface.  Place right / left foot under knee. Extend your opposite leg for support.  Keeping your heel down, slide your right / left foot back toward the chair until you feel a stretch at your ankle or calf. If you do not feel a stretch, slide your bottom forward to the edge of the chair, while still keeping your heel down.  Hold this stretch for 10 seconds. Repeat 3 times. Complete this stretch 2 times per day.   STRETCH  Gastroc, Standing  Place hands on wall.  Extend right / left leg, keeping the front knee somewhat bent.  Slightly point your toes inward on your back foot.  Keeping your right / left heel on the floor and your knee straight, shift your weight toward the wall, not allowing your back to arch.  You should feel a gentle stretch in the right / left calf. Hold this position for 10 seconds. Repeat 3 times. Complete this stretch 2 times per day.  STRETCH  Soleus, Standing  Place hands on wall.  Extend right / left leg, keeping the other knee somewhat bent.  Slightly point your toes inward on your back foot.  Keep your right / left heel on the floor, bend your back knee, and slightly shift your weight over the back leg so that you feel a gentle stretch deep in your back calf.  Hold this position for 10 seconds. Repeat 3 times. Complete this stretch 2 times per day.  STRETCH  Gastrocsoleus, Standing  Note: This exercise can place a lot of stress on your foot and ankle. Please complete this exercise only if specifically instructed by your caregiver.   Place the ball of your right / left foot on a step, keeping your other foot firmly on the same step.  Hold on to the wall or a rail for balance.  Slowly lift your other foot, allowing your body weight to press your heel down over the edge of the step.  You should feel a stretch in your right / left calf.  Hold this position for 10 seconds.  Repeat this exercise with a slight bend in your right / left  knee. Repeat 3 times.  Complete this stretch 2 times per day.   STRENGTHENING EXERCISES - Plantar Fasciitis (Heel Spur Syndrome)  These exercises may help you when beginning to rehabilitate your injury. They may resolve your symptoms with or without further involvement from your physician, physical therapist or athletic trainer. While completing these exercises, remember:   Muscles can gain both the endurance and the strength needed for everyday activities through controlled exercises.  Complete these exercises as instructed by your physician, physical therapist or athletic trainer. Progress the resistance and repetitions only as guided.  STRENGTH - Towel Curls  Sit in a chair positioned on a non-carpeted surface.  Place your foot on a towel, keeping your heel on the floor.  Pull the towel toward your heel by only curling your toes. Keep your heel on the floor. Repeat 3 times. Complete this exercise 2 times per day.  STRENGTH - Ankle Inversion  Secure one end of a rubber exercise band/tubing to a fixed object (table, pole). Loop the other end around your foot just before your toes.  Place your fists between your knees. This will focus your strengthening at your ankle.  Slowly, pull your big toe up and in, making sure the band/tubing is positioned to resist the entire motion.  Hold this position for 10 seconds.  Have your muscles resist the band/tubing as it slowly pulls your foot back to the starting position. Repeat 3 times. Complete this exercises 2 times per day.  Document Released: 09/06/2005 Document Revised: 11/29/2011 Document Reviewed: 12/19/2008 [facility] Patient Information 2014 [place], [place].

## 2016-02-06 ENCOUNTER — Telehealth: Payer: Self-pay | Admitting: *Deleted

## 2016-02-06 NOTE — Telephone Encounter (Signed)
Called patient at [phone] (Home #) to check to see how they were doing from when their nail was removed on Friday, Jan 30, 2016. Pt stated, "Doing pretty good; soaking toe with relief, but does have some drainage from toe". I told patient that drainage is normal for approximately 2-4 weeks after the procedure. I told her if she has any more questions or concerns to call our office back. She stated she understood.

## 2016-02-06 NOTE — Telephone Encounter (Signed)
Pt states she has an question she forgot to ask when she was called previously.  Pt asked when she could go to the beach after her toenail procedure.  I told pt in about another 4-6 weeks once the area had a hard dry scab and no drainage, and to do a epsom salt soak after each time at the water, and cover with an antibiotic bandaid.

## 2016-02-17 ENCOUNTER — Ambulatory Visit: Payer: Self-pay | Admitting: Family Medicine

## 2016-02-18 ENCOUNTER — Encounter: Payer: Self-pay | Admitting: Family Medicine

## 2016-02-18 ENCOUNTER — Ambulatory Visit (INDEPENDENT_AMBULATORY_CARE_PROVIDER_SITE_OTHER): Payer: Worker's Compensation | Admitting: Family Medicine

## 2016-02-18 VITALS — BP 138/88 | HR 97 | Temp 98.1°F | Ht 62.0 in | Wt 186.2 lb

## 2016-02-18 DIAGNOSIS — M545 Low back pain: Secondary | ICD-10-CM

## 2016-02-18 DIAGNOSIS — G8929 Other chronic pain: Secondary | ICD-10-CM

## 2016-02-18 DIAGNOSIS — I10 Essential (primary) hypertension: Secondary | ICD-10-CM

## 2016-02-18 DIAGNOSIS — Z5189 Encounter for other specified aftercare: Secondary | ICD-10-CM

## 2016-02-18 DIAGNOSIS — R52 Pain, unspecified: Secondary | ICD-10-CM

## 2016-02-18 MED ORDER — DICLOFENAC SODIUM 1 % TD GEL: 1.0000 "application " | Freq: Four times a day (QID) | TRANSDERMAL | Status: DC

## 2016-02-18 MED ORDER — OXYCODONE HCL 5 MG PO TABS: 5.0000 mg | ORAL_TABLET | Freq: Three times a day (TID) | ORAL | Status: DC | PRN

## 2016-02-18 MED ORDER — OXYMORPHONE HCL ER 5 MG PO TB12: ORAL_TABLET | ORAL | Status: DC

## 2016-02-18 MED ORDER — ORPHENADRINE CITRATE ER 100 MG PO TB12: 100.0000 mg | ORAL_TABLET | Freq: Two times a day (BID) | ORAL | Status: DC

## 2016-02-18 NOTE — Progress Notes (Signed)
Pre visit review using our clinic review tool, if applicable. No additional management support is needed unless otherwise documented below in the visit note. 

## 2016-02-18 NOTE — Progress Notes (Signed)
Subjective:     Patient ID: Ashley [name], female   DOB: 1961-02-14, 55 y.o.   MRN: [id number]  HPI Here for follow up. She feels well in general except for the low back pain. She will have another radiofrequency ablation to the right lumbar area tomorrow by Dr. Cleda Mccreedy.   Review of Systems  Constitutional: Negative.   Respiratory: Negative.   Cardiovascular: Negative.   Musculoskeletal: Positive for back pain.  Neurological: Negative.        Objective:   Physical Exam  Constitutional: She is oriented to person, place, and time. She appears well-developed and well-nourished.  Neck: No thyromegaly present.  Cardiovascular: Normal rate, regular rhythm, normal heart sounds and intact distal pulses.   Pulmonary/Chest: Effort normal and breath sounds normal.  Musculoskeletal: Normal range of motion. She exhibits no edema or tenderness.  Lymphadenopathy:    She has no cervical adenopathy.  Neurological: She is alert and oriented to person, place, and time.       Assessment:   Her HTN is stable. Her low back pain is stable.     Plan:    Meds were refilled. Recheck in 3 months.  Laurey Morale, MD

## 2016-02-19 ENCOUNTER — Ambulatory Visit: Payer: Self-pay | Admitting: Family Medicine

## 2016-02-19 ENCOUNTER — Encounter: Payer: Self-pay | Admitting: Pain Medicine

## 2016-02-19 ENCOUNTER — Ambulatory Visit: Payer: Worker's Compensation | Attending: Pain Medicine | Admitting: Pain Medicine

## 2016-02-19 VITALS — BP 131/72 | HR 69 | Temp 98.1°F | Resp 17 | Ht 62.0 in | Wt 186.0 lb

## 2016-02-19 DIAGNOSIS — K219 Gastro-esophageal reflux disease without esophagitis: Secondary | ICD-10-CM | POA: Insufficient documentation

## 2016-02-19 DIAGNOSIS — B0089 Other herpesviral infection: Secondary | ICD-10-CM | POA: Diagnosis not present

## 2016-02-19 DIAGNOSIS — H00019 Hordeolum externum unspecified eye, unspecified eyelid: Secondary | ICD-10-CM | POA: Diagnosis not present

## 2016-02-19 DIAGNOSIS — J45909 Unspecified asthma, uncomplicated: Secondary | ICD-10-CM | POA: Diagnosis not present

## 2016-02-19 DIAGNOSIS — M791 Myalgia: Secondary | ICD-10-CM | POA: Insufficient documentation

## 2016-02-19 DIAGNOSIS — B370 Candidal stomatitis: Secondary | ICD-10-CM | POA: Diagnosis not present

## 2016-02-19 DIAGNOSIS — F329 Major depressive disorder, single episode, unspecified: Secondary | ICD-10-CM | POA: Diagnosis not present

## 2016-02-19 DIAGNOSIS — M545 Low back pain, unspecified: Secondary | ICD-10-CM

## 2016-02-19 DIAGNOSIS — M47816 Spondylosis without myelopathy or radiculopathy, lumbar region: Secondary | ICD-10-CM | POA: Insufficient documentation

## 2016-02-19 DIAGNOSIS — M722 Plantar fascial fibromatosis: Secondary | ICD-10-CM | POA: Insufficient documentation

## 2016-02-19 DIAGNOSIS — G8929 Other chronic pain: Secondary | ICD-10-CM | POA: Insufficient documentation

## 2016-02-19 DIAGNOSIS — J018 Other acute sinusitis: Secondary | ICD-10-CM | POA: Insufficient documentation

## 2016-02-19 DIAGNOSIS — R1031 Right lower quadrant pain: Secondary | ICD-10-CM | POA: Diagnosis not present

## 2016-02-19 DIAGNOSIS — J209 Acute bronchitis, unspecified: Secondary | ICD-10-CM | POA: Insufficient documentation

## 2016-02-19 DIAGNOSIS — M549 Dorsalgia, unspecified: Secondary | ICD-10-CM | POA: Diagnosis present

## 2016-02-19 DIAGNOSIS — Z79891 Long term (current) use of opiate analgesic: Secondary | ICD-10-CM | POA: Insufficient documentation

## 2016-02-19 DIAGNOSIS — K589 Irritable bowel syndrome without diarrhea: Secondary | ICD-10-CM | POA: Diagnosis not present

## 2016-02-19 DIAGNOSIS — N3281 Overactive bladder: Secondary | ICD-10-CM | POA: Insufficient documentation

## 2016-02-19 DIAGNOSIS — I10 Essential (primary) hypertension: Secondary | ICD-10-CM | POA: Diagnosis not present

## 2016-02-19 DIAGNOSIS — R51 Headache: Secondary | ICD-10-CM | POA: Diagnosis not present

## 2016-02-19 DIAGNOSIS — E876 Hypokalemia: Secondary | ICD-10-CM | POA: Diagnosis not present

## 2016-02-19 DIAGNOSIS — G8918 Other acute postprocedural pain: Secondary | ICD-10-CM | POA: Diagnosis not present

## 2016-02-19 MED ORDER — TRIAMCINOLONE ACETONIDE 40 MG/ML IJ SUSP
40.0000 mg | Freq: Once | INTRAMUSCULAR | Status: DC
  Filled 2016-02-19: qty 1

## 2016-02-19 MED ORDER — OXYCODONE HCL 5 MG PO TABS: 5.0000 mg | ORAL_TABLET | Freq: Four times a day (QID) | ORAL | Status: DC | PRN

## 2016-02-19 MED ORDER — LIDOCAINE HCL (PF) 1 % IJ SOLN: 10.0000 mL | Freq: Once | INTRAMUSCULAR | Status: DC

## 2016-02-19 MED ORDER — FENTANYL CITRATE (PF) 100 MCG/2ML IJ SOLN
25.0000 ug | INTRAMUSCULAR | Status: DC | PRN
  Filled 2016-02-19: qty 2

## 2016-02-19 MED ORDER — LACTATED RINGERS IV SOLN: 1000.0000 mL | Freq: Once | INTRAVENOUS | Status: DC

## 2016-02-19 MED ORDER — ROPIVACAINE HCL 2 MG/ML IJ SOLN
9.0000 mL | Freq: Once | INTRAMUSCULAR | Status: DC
  Filled 2016-02-19: qty 10

## 2016-02-19 MED ORDER — TRIAMCINOLONE ACETONIDE 40 MG/ML IJ SUSP
INTRAMUSCULAR | Status: AC
  Administered 2016-02-19: 12:00:00
  Filled 2016-02-19: qty 1

## 2016-02-19 MED ORDER — ROPIVACAINE HCL 2 MG/ML IJ SOLN
INTRAMUSCULAR | Status: AC
  Administered 2016-02-19: 12:00:00
  Filled 2016-02-19: qty 10

## 2016-02-19 MED ORDER — MIDAZOLAM HCL 5 MG/5ML IJ SOLN
1.0000 mg | INTRAMUSCULAR | Status: DC | PRN
  Filled 2016-02-19: qty 5

## 2016-02-19 NOTE — Patient Instructions (Signed)
Radiofrequency Lesioning Radiofrequency lesioning is a procedure that is performed to relieve pain. The procedure is often used for back, neck, or arm pain. Radiofrequency lesioning involves the use of a machine that creates radio waves to make heat. During the procedure, the heat is applied to the nerve that carries the pain signal. The heat damages the nerve and interferes with the pain signal. Pain relief usually lasts for 6 months to 1 year. LET [facility] CARE PROVIDER KNOW ABOUT:  Any allergies you have.  All medicines you are taking, including vitamins, herbs, eye drops, creams, and over-the-counter medicines.  Previous problems you or members of your family have had with the use of anesthetics.  Any blood disorders you have.  Previous surgeries you have had.  Any medical conditions you have.  Whether you are pregnant or may be pregnant. RISKS AND COMPLICATIONS Generally, this is a safe procedure. However, problems may occur, including:  Pain or soreness at the injection site.  Infection at the injection site.  Damage to nerves or blood vessels. BEFORE THE PROCEDURE  Ask your health care provider about:  Changing or stopping your regular medicines. This is especially important if you are taking diabetes medicines or blood thinners.  Taking medicines such as aspirin and ibuprofen. These medicines can thin your blood. Do not take these medicines before your procedure if your health care provider instructs you not to.  Follow instructions from your health care provider about eating or drinking restrictions.  Plan to have someone take you home after the procedure.  If you go home right after the procedure, plan to have someone with you for 24 hours. PROCEDURE  You will be given one or more of the following:  A medicine to help you relax (sedative).  A medicine to numb the area (local anesthetic).  You will be awake during the procedure. You will need to be able to  talk with the health care provider during the procedure.  With the help of a type of X-ray (fluoroscopy), the health care provider will insert a radiofrequency needle into the area to be treated.  Next, a wire that carries the radio waves (electrode) will be put through the radiofrequency needle. An electrical pulse will be sent through the electrode to verify the correct nerve. You will feel a tingling sensation, and you may have muscle twitching.  Then, the tissue that is around the needle tip will be heated by an electric current that is passed using the radiofrequency machine. This will numb the nerves.  A bandage (dressing) will be put on the insertion area after the procedure is done. The procedure may vary among health care providers and hospitals. AFTER THE PROCEDURE  Your blood pressure, heart rate, breathing rate, and blood oxygen level will be monitored often until the medicines you were given have worn off.  Return to your normal activities as directed by your health care provider.   This information is not intended to replace advice given to you by your health care provider. Make sure you discuss any questions you have with your health care provider.   Document Released: 05/05/2011 Document Revised: 05/28/2015 Document Reviewed: 10/14/2014 Elsevier Interactive Patient Education 2016 [employer] Pain Management Discharge Instructions  General Discharge Instructions :  If you need to reach your doctor call: Monday-Friday 8:00 am - 4:00 pm at [phone] or toll free [phone].  After clinic hours [phone] to have operator reach doctor.  Bring all of your medication bottles to all your  appointments in the pain clinic.  To cancel or reschedule your appointment with Pain Management please remember to call 24 hours in advance to avoid a fee.  Refer to the educational materials which you have been given on: General Risks, I had my Procedure. Discharge Instructions,  Post Sedation.  Post Procedure Instructions:  The drugs you were given will stay in your system until tomorrow, so for the next 24 hours you should not drive, make any legal decisions or drink any alcoholic beverages.  You may eat anything you prefer, but it is better to start with liquids then soups and crackers, and gradually work up to solid foods.  Please notify your doctor immediately if you have any unusual bleeding, trouble breathing or pain that is not related to your normal pain.  Depending on the type of procedure that was done, some parts of your body may feel week and/or numb.  This usually clears up by tonight or the next day.  Walk with the use of an assistive device or accompanied by an adult for the 24 hours.  You may use ice on the affected area for the first 24 hours.  Put ice in a Ziploc bag and cover with a towel and place against area 15 minutes on 15 minutes off.  You may switch to heat after 24 hours.

## 2016-02-19 NOTE — Progress Notes (Signed)
Patient's Name: Ashley [name]  Patient type: Established  MRN: [id number]  Service setting: Ambulatory outpatient  DOB: Dec 11, 1960  Location: [facility]  DOS: 02/19/2016  Primary Care Physician: Laurey Morale, MD  Note by: Kathlen Brunswick. Dossie Arbour, M.D, DABA, DABAPM, DABPM, DABIPP, FIPP  Referring Physician: Milinda Pointer, MD  Specialty: Board-Certified Interventional Pain Management  Last Visit to Pain Management: 12/16/2015   Primary Reason(s) for Visit: Interventional Pain Management Treatment. CC: Back Pain  Primary Diagnosis: Facet syndrome, lumbar [M54.5]   Procedure:  Anesthesia, Analgesia, Anxiolysis:  Type: Therapeutic Medial Branch Facet Radiofrequency Ablation Region: Lumbar Level: L2, L3, L4, L5, & S1 Medial Branch Level(s) Laterality: Right-Sided  Indications: 1. Lumbar facet syndrome   2. Chronic low back pain (Location of Primary Source of Pain) (Bilateral) (R>L)   3. Lumbar spondylosis, unspecified spinal osteoarthritis   4. Acute postoperative pain (Lumbar Facet Radiofrequency on 02/19/16)     Pre-procedure Pain Score: 1/10 Reported level of pain is compatible with clinical observations Post-procedure Pain Score: 1   The patient has failed to respond to conservative therapies including over-the-counter medications, anti-inflammatories, muscle relaxants, membrane stabilizers, opioids, physical therapy, modalities such as heat and ice, as well as more invasive techniques such as nerve blocks. The patient did attained more than 50% relief of the pain from a series of diagnostic injections conducted in separate occasions.  Type: Moderate (Conscious) Sedation & Local Anesthesia Local Anesthetic: Lidocaine 1% Route: Intravenous (IV) IV Access: Secured Sedation: Meaningful verbal contact was maintained at all times during the procedure  Indication(s): Analgesia & Anxiolysis   Pre-Procedure Assessment:  Ashley [name] is a 55 y.o. year old,  female patient, seen today for interventional treatment. She has HERPES, GENITAL NOS; CANDIDIASIS, ORAL; HYPOKALEMIA; DEPRESSION; STYE; Essential hypertension; ACUTE BRONCHITIS; ALLERGIC RHINITIS; ASTHMA; GERD; IBS; OVERACTIVE BLADDER; LOW BACK PAIN; PLANTAR FASCIITIS; Myalgia and myositis; HEADACHE; ABDOMINAL PAIN, RIGHT LOWER QUADRANT; HTN (hypertension); Acute bacterial sinusitis; Chronic low back pain (Location of Primary Source of Pain) (Bilateral) (R>L); Lumbar spondylosis; Lumbar facet syndrome (Location of Primary Source of Pain) (Bilateral) (R>L); Chronic pain; Long term current use of opiate analgesic; Long term prescription opiate use; Opiate use (112.5 MME/Day); Encounter for therapeutic drug level monitoring; Pain management; and Acute postoperative pain (Lumbar Facet Radiofrequency on 02/19/16) on her problem list.. Her primarily concern today is the Back Pain   Pain Type: Chronic pain Pain Location: Back Pain Orientation: Lower Pain Descriptors / Indicators: Aching Pain Frequency: Intermittent  Date of Last Visit: 12/02/15 Service Provided on Last Visit: Evaluation  Verification of the correct person, correct site (including marking of site), and correct procedure were performed and confirmed by the patient.  Consent: Secured. Under the influence of no sedatives a written informed consent was obtained, after having provided information on the risks and possible complications. To fulfill our ethical and legal obligations, as recommended by the American Medical Association's Code of Ethics, we have provided information to the patient about our clinical impression; the nature and purpose of the treatment or procedure; the risks, benefits, and possible complications of the intervention; alternatives; the risk(s) and benefit(s) of the alternative treatment(s) or procedure(s); and the risk(s) and benefit(s) of doing nothing. The patient was provided information about the risks and possible  complications associated with the procedure. These include, but are not limited to, failure to achieve desired goals, infection, bleeding, organ or nerve damage, allergic reactions, paralysis, and death. In the case of spinal procedures these may include, but are not limited  to, failure to achieve desired goals, infection, bleeding, organ or nerve damage, allergic reactions, paralysis, and death. In addition, the patient was informed that Medicine is not an exact science; therefore, there is also the possibility of unforeseen risks and possible complications that may result in a catastrophic outcome. The patient indicated having understood very clearly. We have given the patient no guarantees and we have made no promises. Enough time was given to the patient to ask questions, all of which were answered to the patient's satisfaction.  Consent Attestation: I, the ordering provider, attest that I have discussed with the patient the benefits, risks, side-effects, alternatives, likelihood of achieving goals, and potential problems during recovery for the procedure that I have provided informed consent.  Pre-Procedure Preparation: Safety Precautions: Allergies reviewed. Appropriate site, procedure, and patient were confirmed by following the Joint Commission's Universal Protocol (UP.01.01.01), in the form of a "Time Out". The patient was asked to confirm marked site and procedure, before commencing. The patient was asked about blood thinners, or active infections, both of which were denied. Patient was assessed for positional comfort and all pressure points were checked before starting procedure. Allergies: She is allergic to aspirin; ciprofloxacin; gabapentin; and levetiracetam.. Infection Control Precautions: Aseptic technique used. Standard Universal Precautions were taken as recommended by the Department of [facility] for Disease Control and Prevention (CDC). Standard pre-surgical skin prep was  conducted. Respiratory hygiene and cough etiquette was practiced. Hand hygiene observed. Safe injection practices and needle disposal techniques followed. SDV (single dose vial) medications used. Medications properly checked for expiration dates and contaminants. Personal protective equipment (PPE) used: Sterile Radiation-resistant gloves. Monitoring:  As per clinic protocol. Filed Vitals:   02/19/16 1245 02/19/16 1249 02/19/16 1302 02/19/16 1313  BP: 142/76 134/104 128/89 131/72  Pulse: 71 71 73 69  Temp:      TempSrc:      Resp:  _0 Height:      Weight:      SpO2: 100% 98% 96% 97%  Calculated BMI: Body mass index is 34.01 kg/(m^2).  Description of Procedure Process:   Time-out: "Time-out" completed before starting procedure, as per protocol. Position: Prone Target Area: For Lumbar Facet blocks, the target is the groove formed by the junction of the transverse process and superior articular process. For the L5 dorsal ramus, the target is the notch between superior articular process and sacral ala. For the S1 dorsal ramus, the target is the superior and lateral edge of the posterior S1 Sacral foramen. Approach: Paraspinal approach. Area Prepped: Entire Posterior Lumbosacral Region Prepping solution: ChloraPrep (2% chlorhexidine gluconate and 70% isopropyl alcohol) Safety Precautions: Aspiration looking for blood return was conducted prior to all injections. At no point did we inject any substances, as a needle was being advanced. No attempts were made at seeking any paresthesias. Safe injection practices and needle disposal techniques used. Medications properly checked for expiration dates. SDV (single dose vial) medications used.   Description of the Procedure: Protocol guidelines were followed. The patient was placed in position over the fluoroscopy table. The target area was identified and the area prepped in the usual manner. Skin desensitized using vapocoolant spray. Skin & deeper  tissues infiltrated with local anesthetic. Appropriate amount of time allowed to pass for local anesthetics to take effect. Radiofrequency needles were introduced to the area of the medial branch at the junction of the superior articular process and transverse process using fluoroscopy. Using the [employer], sensory stimulation using 50 Hz  was used to locate & identify the nerve, making sure that the needle was positioned such that there was no sensory stimulation below 0.3 V or above 0.7 V. Stimulation using 2 Hz was used to evaluate the motor component. Care was taken not to lesion any nerves that demonstrated motor stimulation of the lower extremities at an output of less than 2.5 times that of the sensory threshold, or a maximum of 2.0 V. Once satisfactory placement of the needles was achieved, the above solution was slowly injected after negative aspiration. After waiting for at least 2 minutes, the ablation was performed at 80 degrees C for 60 seconds.The needles were then removed and the area cleansed, making sure to leave some of the prepping solution back to take advantage of its long term bactericidal properties. EBL: None Materials & Medications Used:  Needle(s) Used: Teflon-Coated Radiofrequency Needles  Imaging Guidance:   Type of Imaging Technique: Fluoroscopy Guidance (Spinal) Indication(s): Assistance in needle guidance and placement for procedures requiring needle placement in or near specific anatomical locations not easily accessible without such assistance. Exposure Time: Please see nurses notes. Contrast: None required. Fluoroscopic Guidance: I was personally present in the fluoroscopy suite, where the patient was placed in position for the procedure, over the fluoroscopy-compatible table. Fluoroscopy was manipulated, using "Tunnel Vision Technique", to obtain the best possible view of the target area, on the affected side. Parallax error was corrected before  commencing the procedure. A "direction-depth-direction" technique was used to introduce the needle under continuous pulsed fluoroscopic guidance. Once the target was reached, antero-posterior, oblique, and lateral fluoroscopic projection views were taken to confirm needle placement in all planes. Permanently recorded images stored by scanning into EMR. Interpretation: Intraoperative imaging interpretation by performing Physician. Adequate needle placement confirmed.  Antibiotic Prophylaxis:  Indication(s): No indications identified. Type:  Antibiotics Given (last 72 hours)    None       Post-operative Assessment:   Complications: No immediate post-treatment complications were observed Disposition: Return to clinic for follow-up evaluation. The patient tolerated the entire procedure well. A repeat set of vitals were taken after the procedure and the patient was kept under observation following institutional policy, for this procedure. Post-procedural neurological assessment was performed, showing return to baseline, prior to discharge. The patient was discharged home, once institutional criteria were met. The patient was provided with post-procedure discharge instructions, including a section on how to identify potential problems. Should any problems arise concerning this procedure, the patient was given instructions to immediately contact us, at any time, without hesitation. In any case, we plan to contact the patient by telephone for a follow-up status report regarding this interventional procedure. Comments:  No additional relevant information  Medications administered during this visit: We administered ropivacaine (PF) 2 mg/ml (0.2%) and triamcinolone acetonide.  Prescriptions ordered during this visit: New Prescriptions   No medications on file   Meds ordered this encounter  Medications  . fentaNYL (SUBLIMAZE) injection 25-50 mcg    Sig:     Make sure Narcan is available in the pyxis  when using this medication. In the event of respiratory depression (RR< 8/min): Titrate NARCAN (naloxone) in increments of 0.1 to 0.2 mg IV at 2-3 minute intervals, until desired degree of reversal.  . lactated ringers infusion 1,000 mL    Sig:   . midazolam (VERSED) 5 MG/5ML injection 1-2 mg    Sig:     Make sure Flumazenil is available in the pyxis when using this medication. If oversedation occurs, administer  0.2 mg IV over 15 sec. If after 45 sec no response, administer 0.2 mg again over 1 min; may repeat at 1 min intervals; not to exceed 4 doses (1 mg)  . triamcinolone acetonide (KENALOG-40) injection 40 mg    Sig:   . lidocaine (PF) (XYLOCAINE) 1 % injection 10 mL    Sig:   . ropivacaine (PF) 2 mg/ml (0.2%) (NAROPIN) epidural 9 mL    Sig:   . oxyCODONE (OXY IR/ROXICODONE) 5 MG immediate release tablet    Sig: Take 1-2 tablets (5-10 mg total) by mouth every 6 (six) hours as needed for severe pain (For post-radiofrequency pain.).    Dispense:  200 tablet    Refill:  0    Do not add this medication to the electronic "Automatic Refill" notification system. Patient may have prescription filled one day early if pharmacy is closed on scheduled refill date. Do not fill until: 02/19/16. To last until: 03/20/16. Not to be repeated.   Requested PM Follow-up: Return for Post-RFA Eval (6 weeks).  Future Appointments Date Time Provider [place]  03/24/2016 11:00 AM Milinda Pointer, MD [facility] None  05/19/2016 10:45 AM Laurey Morale, MD [facility]    Primary Care Physician: Laurey Morale, MD Location: [facility] Note by: Kathlen Brunswick. Dossie Arbour, M.D, DABA, DABAPM, DABPM, DABIPP, FIPP Disclaimer:  Medicine is not an exact science. The only guarantee in medicine is that nothing is guaranteed. It is important to note that the decision to proceed with this intervention was based on the information collected from the patient. The Data and conclusions  were drawn from the patient's questionnaire, the interview, and the physical examination. Because the information was provided in large part by the patient, it cannot be guaranteed that it has not been purposely or unconsciously manipulated. Every effort has been made to obtain as much relevant data as possible for this evaluation. It is important to note that the conclusions that lead to this procedure are derived in large part from the available data. Always take into account that the treatment will also be dependent on availability of resources and existing treatment guidelines, considered by other Pain Management Practitioners as being common knowledge and practice, at the time of the intervention. For Medico-Legal purposes, it is also important to point out that variation in procedural techniques and pharmacological choices are the acceptable norm. The indications, contraindications, technique, and results of the above procedure should only be interpreted and judged by a Board-Certified Interventional Pain Specialist with extensive familiarity and expertise in the same exact procedure and technique. Attempts at providing opinions without similar or greater experience and expertise than that of the treating physician will be considered as inappropriate and unethical, and shall result in a formal complaint to the state medical board and applicable specialty societies.

## 2016-02-19 NOTE — Progress Notes (Signed)
Patient here for lumbar facet, Radio frequency procedure d/t lower back pain, right side. Safety precautions to be maintained throughout the outpatient stay will include: orient to surroundings, keep bed in low position, maintain call bell within reach at all times, provide assistance with transfer out of bed and ambulation.

## 2016-02-20 ENCOUNTER — Telehealth: Payer: Self-pay | Admitting: Family Medicine

## 2016-02-20 NOTE — Telephone Encounter (Signed)
Left Message if call if needed

## 2016-02-20 NOTE — Telephone Encounter (Signed)
Pt had a radiofrequency don on 6/1 and Dr Dossie Arbour gave pt 200 mg Oxycodone one time thing just for the procedure.

## 2016-02-20 NOTE — Telephone Encounter (Signed)
Looks like a FYI 

## 2016-02-23 ENCOUNTER — Other Ambulatory Visit: Payer: Self-pay | Admitting: Family Medicine

## 2016-02-23 NOTE — Telephone Encounter (Signed)
Pt need new Rx for Norflex (11 mos. worth)  Pharm: [facility] in [place].

## 2016-03-03 DIAGNOSIS — H16213 Exposure keratoconjunctivitis, bilateral: Secondary | ICD-10-CM | POA: Diagnosis not present

## 2016-03-03 DIAGNOSIS — H16223 Keratoconjunctivitis sicca, not specified as Sjogren's, bilateral: Secondary | ICD-10-CM | POA: Diagnosis not present

## 2016-03-03 DIAGNOSIS — H04123 Dry eye syndrome of bilateral lacrimal glands: Secondary | ICD-10-CM | POA: Diagnosis not present

## 2016-03-24 ENCOUNTER — Ambulatory Visit: Payer: Self-pay | Admitting: Pain Medicine

## 2016-04-07 ENCOUNTER — Ambulatory Visit: Payer: Worker's Compensation | Attending: Pain Medicine | Admitting: Pain Medicine

## 2016-04-07 ENCOUNTER — Encounter: Payer: Self-pay | Admitting: Pain Medicine

## 2016-04-07 VITALS — BP 134/92 | HR 89 | Temp 98.4°F | Resp 18 | Ht 62.0 in | Wt 185.0 lb

## 2016-04-07 DIAGNOSIS — M50221 Other cervical disc displacement at C4-C5 level: Secondary | ICD-10-CM | POA: Diagnosis not present

## 2016-04-07 DIAGNOSIS — M2578 Osteophyte, vertebrae: Secondary | ICD-10-CM | POA: Diagnosis not present

## 2016-04-07 DIAGNOSIS — F329 Major depressive disorder, single episode, unspecified: Secondary | ICD-10-CM | POA: Diagnosis not present

## 2016-04-07 DIAGNOSIS — F1190 Opioid use, unspecified, uncomplicated: Secondary | ICD-10-CM | POA: Diagnosis not present

## 2016-04-07 DIAGNOSIS — I10 Essential (primary) hypertension: Secondary | ICD-10-CM | POA: Insufficient documentation

## 2016-04-07 DIAGNOSIS — J45909 Unspecified asthma, uncomplicated: Secondary | ICD-10-CM | POA: Insufficient documentation

## 2016-04-07 DIAGNOSIS — J019 Acute sinusitis, unspecified: Secondary | ICD-10-CM | POA: Insufficient documentation

## 2016-04-07 DIAGNOSIS — N3281 Overactive bladder: Secondary | ICD-10-CM | POA: Insufficient documentation

## 2016-04-07 DIAGNOSIS — K589 Irritable bowel syndrome without diarrhea: Secondary | ICD-10-CM | POA: Diagnosis not present

## 2016-04-07 DIAGNOSIS — M791 Myalgia: Secondary | ICD-10-CM | POA: Insufficient documentation

## 2016-04-07 DIAGNOSIS — Z79891 Long term (current) use of opiate analgesic: Secondary | ICD-10-CM | POA: Diagnosis not present

## 2016-04-07 DIAGNOSIS — B9689 Other specified bacterial agents as the cause of diseases classified elsewhere: Secondary | ICD-10-CM | POA: Diagnosis not present

## 2016-04-07 DIAGNOSIS — M47816 Spondylosis without myelopathy or radiculopathy, lumbar region: Secondary | ICD-10-CM | POA: Insufficient documentation

## 2016-04-07 DIAGNOSIS — K219 Gastro-esophageal reflux disease without esophagitis: Secondary | ICD-10-CM | POA: Insufficient documentation

## 2016-04-07 DIAGNOSIS — M545 Low back pain: Secondary | ICD-10-CM | POA: Diagnosis not present

## 2016-04-07 DIAGNOSIS — M5126 Other intervertebral disc displacement, lumbar region: Secondary | ICD-10-CM | POA: Insufficient documentation

## 2016-04-07 DIAGNOSIS — J309 Allergic rhinitis, unspecified: Secondary | ICD-10-CM | POA: Diagnosis not present

## 2016-04-07 DIAGNOSIS — M722 Plantar fascial fibromatosis: Secondary | ICD-10-CM | POA: Insufficient documentation

## 2016-04-07 DIAGNOSIS — M549 Dorsalgia, unspecified: Secondary | ICD-10-CM | POA: Diagnosis present

## 2016-04-07 NOTE — Progress Notes (Signed)
Safety precautions to be maintained throughout the outpatient stay will include: orient to surroundings, keep bed in low position, maintain call bell within reach at all times, provide assistance with transfer out of bed and ambulation.  

## 2016-04-07 NOTE — Progress Notes (Signed)
Patient's Name: Ashley [name]  Patient type: Established  MRN: [id number]  Service setting: Ambulatory outpatient  DOB: 1961-05-24  Location: [facility]  DOS: 04/07/2016  Primary Care Physician: Laurey Morale, MD  Note by: Kathlen Brunswick. Dossie Arbour, M.D, DABA, Sarita Haver, DABPM, Milagros Evener, FIPP  Referring Physician: Laurey Morale, MD  Specialty: Board-Certified Interventional Pain Management  Last Visit to Pain Management: 02/19/2016   Primary Reason(s) for Visit: Encounter for post-procedure evaluation of chronic illness with mild to moderate exacerbation CC: Back Pain   HPI  Ms. Ashley [name] is a 55 y.o. year old, female patient, who returns today as an established patient. She has Genital herpes; Depression; Essential hypertension; Allergic rhinitis; Asthma; GERD; IBS; Overactive bladder; Plantar fasciitis; Myalgia and myositis; HTN (hypertension); Acute bacterial sinusitis; Chronic low back pain (Location of Primary Source of Pain) (Bilateral) (R>L); Lumbar spondylosis; Lumbar facet syndrome (Location of Primary Source of Pain) (Bilateral) (R>L); Chronic pain; Long term current use of opiate analgesic; Long term prescription opiate use; Opiate use (112.5 MME/Day); Encounter for therapeutic drug level monitoring; and Pain management on her problem list.. Her primarily concern today is the Back Pain   Pain Assessment: Self-Reported Pain Score: 1              Reported level is compatible with observation       Pain Type: Chronic pain Pain Location: Back Pain Orientation: Left, Lower Pain Descriptors / Indicators: Dull, Aching Pain Frequency: Constant  The patient comes into the clinics today for post-procedure evaluation on the interventional treatment done on 02/19/2016.  Date of Last Visit: 02/19/16 Service Provided on Last Visit: Procedure  Post-Procedure Assessment  Procedure done on last visit: Right Lumbar Facet RFA w/ sedation. Side-effects or Adverse reactions: None  reported Sedation: Please see nurses note  Results: Ultra-Short Term Relief (First 1 hour after procedure): 50 %  Analgesia during this period is likely to be Local Anesthetic and/or IV Sedative (Analgesic/Anxiolitic) related Short Term Relief (Initial 4-6 hrs after procedure): 50 % Complete relief would confirms area to be the source of pain Long Term Relief : 75 % Long-term benefit would suggest an inflammatory etiology to the pain   Current Relief (Now): 75%   Long-term benefit could signal adequate radiofrequency ablation Interpretation of Results: The patient is very excited with the results and is looking forward to having the opposite side done as soon as possible.  Laboratory Chemistry  Inflammation Markers No results found for: ESRSEDRATE, CRP  Renal Function Lab Results  Component Value Date   BUN 12 10/09/2015   CREATININE 0.80 10/09/2015   GFRAA 87 09/05/2008   GFRNONAA 71.44 11/12/2009    Hepatic Function Lab Results  Component Value Date   AST 17 10/09/2015   ALT 30 10/09/2015   ALBUMIN 3.8 10/09/2015    Electrolytes Lab Results  Component Value Date   NA 137 10/09/2015   K 3.8 10/09/2015   CL 104 10/09/2015   CALCIUM 9.1 10/09/2015    Pain Modulating Vitamins No results found for: Maralyn Sago [id number], [id number], [id number], 25OHVITD1, 25OHVITD2, 25OHVITD3, VITAMINB12  Coagulation Parameters Lab Results  Component Value Date   PLT 279.0 10/09/2015    Cardiovascular Lab Results  Component Value Date   HGB 14.7 10/09/2015   HCT 43.9 10/09/2015    Note: Lab results reviewed.  Recent Diagnostic Imaging  Cervical Imaging: Cervical MR wo contrast:  Results for orders placed during the hospital encounter of 12/10/03  MR Cervical Spine Wo Contrast   Narrative  Clinical Data: Neck and right shoulder pain for approximately five months. The patient reports a fall one month ago. No previous surgery.   MRI OF THE RIGHT SHOULDER WITHOUT CONTRAST   Multiplanar imaging was performed in the open scanner at [facility]. There are no radiographs available for correlation.  There is no significant shoulder joint effusion or fluid in the subacromial/subdeltoid bursa. There is mild distal supraspinatus tendinosis, but no evidence of rotator cuff tear. No labral tear is demonstrated. The biceps tendon appears intact and is normally positioned in the bicipital groove. The acromion is Type I with mild lateral downsloping.  IMPRESSION  Mild supraspinatus tendinosis and lateral downsloping of the acromion. No evidence of rotator cuff tear.     MRI OF THE CERVICAL SPINE WITHOUT CONTRAST  AP and lateral cervical radiographs done at [facility] on 11/20/03 are correlated. Multiplanar imaging was performed on the open scanner at [facility].   The alignment is anatomic. There are no suspicious bone marrow signal abnormalities. The cervical cord is normal in signal and caliber. Craniocervical junction appears normal. No significant findings are present at or above the C3-4 disk space level.   C4-5: Mild spondylosis is present with posterior osteophytes covering mildly bulging disk material. There is no focal disk protrusion or foraminal compromise.   C5-6: Spondylosis is most advanced at this level with posterior osteophytes covering diffusely protruding disk material. Most apparent on the sagittal images, are broad-based foraminal disk protrusions, larger on the right than the left. There is encroachment on the right C6 nerve root and possible left C6 nerve root encroachment as well. CSF surrounding the cord is effaced, but there is no cord flattening.  C6-7: There is mild spondylosis with posterior osteophytes covering mildly bulging disk material. No spinal stenosis or nerve root encroachment is demonstrated.   C7-T1 and T1-2: Normal interspaces.  IMPRESSION  1. The most significant finding is at C5-6 where there is moderate spondylosis and broad-based  foraminal disk protrusions, right greater than left. Right C6 nerve root encroachment is likely, and there may be left C6 nerve root encroachment as well.  2. Mild spondylosis and disk bulging at C4-5 and C6-7 without resulting spinal stenosis or nerve root encroachment.      Provider: Weyman Rodney   Cervical DG 2-3 views:  Results for orders placed during the hospital encounter of 02/27/04  DG Cervical Spine 2-3 Views   Narrative Clinical Data: HNP C5-6. Followup.  CERVICAL SPINE   Two views of the cervical spine were obtained. An anterior fusion has been performed at C5-6. The interbody fusion plug is in good position as is the anterior metallic fixation plate, with normal alignment maintained. No significant prevertebral soft tissue swelling is seen.   IMPRESSION  Anterior fusion C5-6. Normal alignment.    Provider: Bryson Corona   Thoracic Imaging: Thoracic MR wo contrast:  Results for orders placed during the hospital encounter of 08/05/04  MR Thoracic Spine Wo Contrast   Narrative Clinical Data: Neck and back pain worsening over the last four months. Right arm and right leg pain.   MRI OF THE LUMBAR SPINE WITHOUT CONTRAST:  Multiplanar T1 and T2-weighted imaging was performed.   The alignment of the spine is normal. There is no disk pathology at L2-3 or above. The canal and foramina are widely patent. The distal cord and conus are normal.   L3-4: There is a small foraminal to extraforaminal annular rent and disk protrusion that could focally irritate the right L3  nerve root.   L4-5: There is a shallow right posterolateral protrusion which results in mild narrowing of the right lateral recess, but is not definitely compressive.   L5-S1: The disk bulges minimally, but there is no herniation or stenosis.  IMPRESSION:  1. Right foraminal to extraforaminal annular rent and disk protrusion at L3-4 which could focally affect the right L3 nerve root.  2. Shallow right posterolateral  protrusion at L4-5 with mild narrowing of the right lateral recess, not definitely compressive.   MRI OF THE THORACIC SPINE WITHOUT CONTRAST:  Multiplanar T1 and T2-weighted imaging was performed.  The alignment of the spine is normal. The intervertebral disks appear normal without evidence of degeneration, bulge or herniation. The spinal canal and foramina are widely patent. The thoracic spinal cord appears normal throughout. No paravertebral lesion is seen.   IMPRESSION:  Normal MRI of the thoracic spine.        Provider: Sammie Bench   Lumbosacral Imaging: Lumbar MR wo contrast:  Results for orders placed in visit on 07/13/12  MR Lumbar Spine Wo Contrast   Lumbar MR wo contrast:  Results for orders placed in visit on 01/19/07  MR L Spine Ltd W/O Cm   Narrative * PRIOR REPORT IMPORTED FROM AN EXTERNAL SYSTEM *   PRIOR REPORT IMPORTED FROM THE SYNGO [place] EXAM:    low back pain  COMMENTS:   PROCEDURE:     MR  - MR LUMBAR SPINE WO CONTRAST  - Jan 19 2007  5:39PM   RESULT:   HISTORY:  Low back pain.   PROCEDURE AND FINDINGS:   Multiplanar/multisequence imaging of the of the  lumbar spine was obtained.  There is no evidence of disc protrusion or  spinal stenosis.  Mild asymmetric annual bulge to the RIGHT is noted at  L4-L5.  This results in very minimal narrowing of the RIGHT neural  foramen.  Very minimal flattening of the RIGHT lateral recess at this level is  present.  No other significant disc abnormalities are identified.  No bony  abnormalities are identified.  The lumbar cord is normal.   IMPRESSION:   Mild asymmetric annular bulge at L4-L5 with lateral asymmetric bulge to  the  RIGHT neural foramen and RIGHT parasagittal region resulting in very  minimal  narrowing of the RIGHT neural foramen. Minimal flattening of the RIGHT  lateral recess at the L4-L5 level.   Thank you for the opportunity to contribute to the care of your patient.        Knee Imaging: Knee-R DG 4 views:  Results for orders placed in visit on 07/13/12  DG Knee Complete 4 Views Right   Note: Imaging results reviewed.  Meds  The patient has a current medication list which includes the following prescription(s): acyclovir, alprazolam, bupropion hcl, calcium-vitamin d, diclofenac sodium, fish oil-omega-3 fatty acids, fluticasone, metoclopramide, multivitamin, omeprazole, orphenadrine, oxybutynin, oxycodone, oxymorphone, potassium chloride sa, and risperidone.  Current Outpatient Prescriptions on File Prior to Visit  Medication Sig  . acyclovir (ZOVIRAX) 400 MG tablet Take 400 mg by mouth daily.    Marland Kitchen alprazolam (XANAX) 2 MG tablet Take 1 tablet (2 mg total) by mouth 3 (three) times daily as needed.  . BuPROPion HCl (WELLBUTRIN PO) Take 150 each by mouth 2 (two) times daily.    . calcium-vitamin D (OSCAL) 250-125 MG-UNIT per tablet Take 1 tablet by mouth daily.    . diclofenac sodium (VOLTAREN) 1 % GEL Apply 1 application  topically 4 (four) times daily.  . fish oil-omega-3 fatty acids 1000 MG capsule Take 2 g by mouth daily.    . fluticasone (FLONASE) 50 MCG/ACT nasal spray Place 2 sprays into both nostrils daily.  . metoCLOPramide (REGLAN) 5 MG tablet Take 5 mg by mouth 3 (three) times daily before meals.   . Multiple Vitamin (MULTIVITAMIN) capsule Take 1 capsule by mouth daily.    Marland Kitchen omeprazole (PRILOSEC) 40 MG capsule Take 40 mg by mouth 2 (two) times daily.  . orphenadrine (NORFLEX) 100 MG tablet TAKE 1 TABLET BY MOUTH TWICE A DAY  . oxybutynin (DITROPAN) 5 MG tablet TAKE 1 TABLET BY MOUTH TWICE A DAY  . oxyCODONE (OXY IR/ROXICODONE) 5 MG immediate release tablet Take 1-2 tablets (5-10 mg total) by mouth every 6 (six) hours as needed for severe pain (For post-radiofrequency pain.).  Marland Kitchen oxymorphone (OPANA ER) 5 MG 12 hr tablet Takes 3 tabs two times daily  . potassium chloride SA (KLOR-CON M20) 20 MEQ tablet TAKE 1 TABLET (20 MEQ TOTAL) BY MOUTH 2 (TWO)  TIMES DAILY.  Marland Kitchen risperiDONE (RISPERDAL) 2 MG tablet Take 2 mg by mouth daily.     No current facility-administered medications on file prior to visit.    ROS  Constitutional: Denies any fever or chills Gastrointestinal: No reported hemesis, hematochezia, vomiting, or acute GI distress Musculoskeletal: Denies any acute onset joint swelling, redness, loss of ROM, or weakness Neurological: No reported episodes of acute onset apraxia, aphasia, dysarthria, agnosia, amnesia, paralysis, loss of coordination, or loss of consciousness  Allergies  Ms. Sula is allergic to aspirin; ciprofloxacin; gabapentin; and levetiracetam.  [place]  Medical:  Ms. Holst  has a past medical history of Asthma; Depression; Headache(784.0); Allergy; GERD (gastroesophageal reflux disease); IBS (irritable bowel syndrome); Reflex sympathetic dystrophy; Bipolar disorder ([place]); Fibromyalgia; HTN (hypertension); Chronic lower back pain; and Injury of lower back (09-23-05). Family: family history is not on file. Surgical:  has past surgical history that includes Cholecystectomy; Nasal sinus surgery; left shouder; radiofrequency  ablations april 2010 to lower spine per dr Marjorie Smolder; Esophagogastroduodenoscopy (Sept. 2016); Colonoscopy (Sept. 2016); and removal of Left great toenail (Left, 01/2016). Tobacco:  reports that she has never smoked. She has never used smokeless tobacco. Alcohol:  reports that she drinks about 1.2 oz of alcohol per week. Drug:  reports that she does not use illicit drugs.  Constitutional Exam  Vitals: Blood pressure 134/92, pulse 89, temperature 98.4 F (36.9 C), temperature source Oral, resp. rate 18, height 5\' 2"  (1.575 m), weight 185 lb (83.915 kg), SpO2 99 %. General appearance: Well nourished, well developed, and well hydrated. In no acute distress Calculated BMI/Body habitus: Body mass index is 33.83 kg/(m^2). (30-34.9 kg/m2) Obese (Class I) - 68% higher incidence of chronic pain Psych/Mental  status: Alert and oriented x 3 (person, place, & time) Eyes: PERLA Respiratory: No evidence of acute respiratory distress  Cervical Spine Exam  Inspection: No masses, redness, or swelling Alignment: Symmetrical ROM: Functional: ROM is within functional limits [facility]) Stability: No instability detected Muscle strength & Tone: Functionally intact Sensory: Unimpaired Palpation: No complaints of tenderness  Upper Extremity (UE) Exam    Side: Right upper extremity  Side: Left upper extremity  Inspection: No masses, redness, swelling, or asymmetry  Inspection: No masses, redness, swelling, or asymmetry  ROM:  ROM:  Functional: ROM is within functional limits [facility])        Functional: ROM is within functional limits [facility])        Muscle  strength & Tone: Functionally intact  Muscle strength & Tone: Functionally intact  Sensory: Unimpaired  Sensory: Unimpaired  Palpation: No complaints of tenderness  Palpation: No complaints of tenderness   Thoracic Spine Exam  Inspection: No masses, redness, or swelling Alignment: Symmetrical ROM: Functional: ROM is within functional limits [facility]) Stability: No instability detected Sensory: Unimpaired Muscle strength & Tone: Functionally intact Palpation: No complaints of tenderness  Lumbar Spine Exam  Inspection: No masses, redness, or swelling Alignment: Symmetrical ROM: Functional: Reduced ROM Stability: No instability detected Muscle strength & Tone: Functionally intact Sensory: Movement-associated pain Palpation: No complaints of tenderness Provocative Tests: Lumbar Hyperextension and rotation test: Positive on the left for facet joint pain. The right side seems much improved. Patrick's Maneuver: evaluation deferred today              Gait & Posture Assessment  Ambulation: Unassisted Gait: Unaffected Posture: WNL   Lower Extremity Exam    Side: Right lower extremity  Side: Left lower extremity  Inspection: No masses, redness, swelling, or  asymmetry ROM:  Inspection: No masses, redness, swelling, or asymmetry ROM:  Functional: ROM is within functional limits [facility])        Functional: ROM is within functional limits [facility])        Muscle strength & Tone: Functionally intact  Muscle strength & Tone: Functionally intact  Sensory: Unimpaired  Sensory: Unimpaired  Palpation: No complaints of tenderness  Palpation: No complaints of tenderness}   Assessment & Plan  Primary Diagnosis & Pertinent Problem List: The primary encounter diagnosis was Lumbar facet syndrome (Location of Primary Source of Pain) (Bilateral) (R>L). Diagnoses of Opiate use (112.5 MME/Day) and Long term current use of opiate analgesic were also pertinent to this visit.  Visit Diagnosis: 1. Lumbar facet syndrome (Location of Primary Source of Pain) (Bilateral) (R>L)   2. Opiate use (112.5 MME/Day)   3. Long term current use of opiate analgesic     Problem-specific Plan(s): No problem-specific assessment & plan notes found for this encounter.   Plan of Care   Problem List Items Addressed This Visit      High   Lumbar facet syndrome (Location of Primary Source of Pain) (Bilateral) (R>L) - Primary (Chronic)   Relevant Orders   Radiofrequency,Lumbar     Medium   Long term current use of opiate analgesic (Chronic)   Opiate use (112.5 MME/Day) (Chronic)       Pharmacotherapy (Medications Ordered): No orders of the defined types were placed in this encounter.    Lab-work & Procedure Ordered: Orders Placed This Encounter  Procedures  . Radiofrequency,Lumbar    Standing Status: Future     Number of Occurrences:      Standing Expiration Date: 05/07/2016    Scheduling Instructions:     Side(s): Left-sided     Level(s): L2, L3, L4, L5, & S1 Medial Branch Nerves     Sedation: With Sedation.     Timeframe: ASAA    Order Specific Question:  Where will this procedure be performed?    Answer:  [facility]    Imaging  Ordered: None  Interventional Therapies: Scheduled:  Left Lumbar Facet RFA.   Considering:  N/A   PRN Procedures:  N/A   Referral(s) or Consult(s): None at this time.  Medications administered during this visit: Ms. Amini had no medications administered during this visit.  Requested PM Follow-up: Return for Schedule Procedure, (ASAA).  Future Appointments Date Time Provider [place]  05/19/2016 10:45 AM Annie Main  Raymon Mutton, MD [facility]    Primary Care Physician: Laurey Morale, MD Location: [facility] Note by: Kathlen Brunswick. Dossie Arbour, M.D, DABA, DABAPM, DABPM, DABIPP, FIPP  Pain Score Disclaimer: We use the NRS-11 scale. This is a self-reported, subjective measurement of pain severity with only modest accuracy. It is used primarily to identify changes within a particular patient. It must be understood that outpatient pain scales are significantly less accurate that those used for research, where they can be applied under ideal controlled circumstances with minimal exposure to variables. In reality, the score is likely to be a combination of pain intensity and pain affect, where pain affect describes the degree of emotional arousal or changes in action readiness caused by the sensory experience of pain. Factors such as social and work situation, setting, emotional state, anxiety levels, expectation, and prior pain experience may influence pain perception and show large inter-individual differences that may also be affected by time variables.  Patient instructions provided during this appointment: Patient Instructions   GENERAL RISKS AND COMPLICATIONS  What are the risk, side effects and possible complications? Generally speaking, most procedures are safe.  However, with any procedure there are risks, side effects, and the possibility of complications.  The risks and complications are dependent upon the sites that are lesioned, or the type of  nerve block to be performed.  The closer the procedure is to the spine, the more serious the risks are.  Great care is taken when placing the radio frequency needles, block needles or lesioning probes, but sometimes complications can occur. 1. Infection: Any time there is an injection through the skin, there is a risk of infection.  This is why sterile conditions are used for these blocks.  There are four possible types of infection. 1. Localized skin infection. 2. Central Nervous System Infection-This can be in the form of Meningitis, which can be deadly. 3. Epidural Infections-This can be in the form of an epidural abscess, which can cause pressure inside of the spine, causing compression of the spinal cord with subsequent paralysis. This would require an emergency surgery to decompress, and there are no guarantees that the patient would recover from the paralysis. 4. Discitis-This is an infection of the intervertebral discs.  It occurs in about 1% of discography procedures.  It is difficult to treat and it may lead to surgery.        2. Pain: the needles have to go through skin and soft tissues, will cause soreness.       3. Damage to internal structures:  The nerves to be lesioned may be near blood vessels or    other nerves which can be potentially damaged.       4. Bleeding: Bleeding is more common if the patient is taking blood thinners such as  aspirin, Coumadin, Ticiid, Plavix, etc., or if he/she have some genetic predisposition  such as hemophilia. Bleeding into the spinal canal can cause compression of the spinal  cord with subsequent paralysis.  This would require an emergency surgery to  decompress and there are no guarantees that the patient would recover from the  paralysis.       5. Pneumothorax:  Puncturing of a lung is a possibility, every time a needle is introduced in  the area of the chest or upper back.  Pneumothorax refers to free air around the  collapsed lung(s), inside of the  thoracic cavity (chest cavity).  Another two possible  complications related to a  similar event would include: Hemothorax and Chylothorax.   These are variations of the Pneumothorax, where instead of air around the collapsed  lung(s), you may have blood or chyle, respectively.       6. Spinal headaches: They may occur with any procedures in the area of the spine.       7. Persistent CSF (Cerebro-Spinal Fluid) leakage: This is a rare problem, but may occur  with prolonged intrathecal or epidural catheters either due to the formation of a fistulous  track or a dural tear.       8. Nerve damage: By working so close to the spinal cord, there is always a possibility of  nerve damage, which could be as serious as a permanent spinal cord injury with  paralysis.       9. Death:  Although rare, severe deadly allergic reactions known as "Anaphylactic  reaction" can occur to any of the medications used.      10. Worsening of the symptoms:  We can always make thing worse.  What are the chances of something like this happening? Chances of any of this occuring are extremely low.  By statistics, you have more of a chance of getting killed in a motor vehicle accident: while driving to the hospital than any of the above occurring .  Nevertheless, you should be aware that they are possibilities.  In general, it is similar to taking a shower.  Everybody knows that you can slip, hit your head and get killed.  Does that mean that you should not shower again?  Nevertheless always keep in mind that statistics do not mean anything if you happen to be on the wrong side of them.  Even if a procedure has a 1 (one) in a 1,000,000 (million) chance of going wrong, it you happen to be that one..Also, keep in mind that by statistics, you have more of a chance of having something go wrong when taking medications.  Who should not have this procedure? If you are on a blood thinning medication (e.g. Coumadin, Plavix, see list of "Blood  Thinners"), or if you have an active infection going on, you should not have the procedure.  If you are taking any blood thinners, please inform your physician.  How should I prepare for this procedure?  Do not eat or drink anything at least six hours prior to the procedure.  Bring a driver with you .  It cannot be a taxi.  Come accompanied by an adult that can drive you back, and that is strong enough to help you if your legs get weak or numb from the local anesthetic.  Take all of your medicines the morning of the procedure with just enough water to swallow them.  If you have diabetes, make sure that you are scheduled to have your procedure done first thing in the morning, whenever possible.  If you have diabetes, take only half of your insulin dose and notify our nurse that you have done so as soon as you arrive at the clinic.  If you are diabetic, but only take blood sugar pills (oral hypoglycemic), then do not take them on the morning of your procedure.  You may take them after you have had the procedure.  Do not take aspirin or any aspirin-containing medications, at least eleven (11) days prior to the procedure.  They may prolong bleeding.  Wear loose fitting clothing that may be easy to take off and that you would not mind if  it got stained with Betadine or blood.  Do not wear any jewelry or perfume  Remove any nail coloring.  It will interfere with some of our monitoring equipment.  NOTE: Remember that this is not meant to be interpreted as a complete list of all possible complications.  Unforeseen problems may occur.  BLOOD THINNERS The following drugs contain aspirin or other products, which can cause increased bleeding during surgery and should not be taken for 2 weeks prior to and 1 week after surgery.  If you should need take something for relief of minor pain, you may take acetaminophen which is found in Tylenol,m Datril, Anacin-3 and Panadol. It is not blood thinner. The  products listed below are.  Do not take any of the products listed below in addition to any listed on your instruction sheet.  A.P.C or A.P.C with Codeine Codeine Phosphate Capsules #3 Ibuprofen Ridaura  ABC compound Congesprin Imuran rimadil  Advil Cope Indocin Robaxisal  Alka-Seltzer Effervescent Pain Reliever and Antacid Coricidin or Coricidin-D  Indomethacin Rufen  Alka-Seltzer plus Cold Medicine Cosprin Ketoprofen S-A-C Tablets  Anacin Analgesic Tablets or Capsules Coumadin Korlgesic Salflex  Anacin Extra Strength Analgesic tablets or capsules CP-2 Tablets Lanoril Salicylate  Anaprox Cuprimine Capsules Levenox Salocol  Anexsia-D Dalteparin Magan Salsalate  Anodynos Darvon compound Magnesium Salicylate Sine-off  Ansaid Dasin Capsules Magsal Sodium Salicylate  Anturane Depen Capsules Marnal Soma  APF Arthritis pain formula Dewitt's Pills Measurin Stanback  Argesic Dia-Gesic Meclofenamic Sulfinpyrazone  Arthritis Bayer Timed Release Aspirin Diclofenac Meclomen Sulindac  Arthritis pain formula Anacin Dicumarol Medipren Supac  Analgesic (Safety coated) Arthralgen Diffunasal Mefanamic Suprofen  Arthritis Strength Bufferin Dihydrocodeine Mepro Compound Suprol  Arthropan liquid Dopirydamole Methcarbomol with Aspirin Synalgos  ASA tablets/Enseals Disalcid Micrainin Tagament  Ascriptin Doan's Midol Talwin  Ascriptin A/D Dolene Mobidin Tanderil  Ascriptin Extra Strength Dolobid Moblgesic Ticlid  Ascriptin with Codeine Doloprin or Doloprin with Codeine Momentum Tolectin  Asperbuf Duoprin Mono-gesic Trendar  Aspergum Duradyne Motrin or Motrin IB Triminicin  Aspirin plain, buffered or enteric coated Durasal Myochrisine Trigesic  Aspirin Suppositories Easprin Nalfon Trillsate  Aspirin with Codeine Ecotrin Regular or Extra Strength Naprosyn Uracel  Atromid-S Efficin Naproxen Ursinus  Auranofin Capsules Elmiron Neocylate Vanquish  Axotal Emagrin Norgesic Verin  Azathioprine Empirin or Empirin  with Codeine Normiflo Vitamin E  Azolid Emprazil Nuprin Voltaren  Bayer Aspirin plain, buffered or children's or timed BC Tablets or powders Encaprin Orgaran Warfarin Sodium  Buff-a-Comp Enoxaparin Orudis Zorpin  Buff-a-Comp with Codeine Equegesic Os-Cal-Gesic   Buffaprin Excedrin plain, buffered or Extra Strength Oxalid   Bufferin Arthritis Strength Feldene Oxphenbutazone   Bufferin plain or Extra Strength Feldene Capsules Oxycodone with Aspirin   Bufferin with Codeine Fenoprofen Fenoprofen Pabalate or Pabalate-SF   Buffets II Flogesic Panagesic   Buffinol plain or Extra Strength Florinal or Florinal with Codeine Panwarfarin   Buf-Tabs Flurbiprofen Penicillamine   Butalbital Compound Four-way cold tablets Penicillin   Butazolidin Fragmin Pepto-Bismol   Carbenicillin Geminisyn Percodan   Carna Arthritis Reliever Geopen Persantine   Carprofen Gold's salt Persistin   Chloramphenicol Goody's Phenylbutazone   Chloromycetin Haltrain Piroxlcam   Clmetidine heparin Plaquenil   Cllnoril Hyco-pap Ponstel   Clofibrate Hydroxy chloroquine Propoxyphen         Before stopping any of these medications, be sure to consult the physician who ordered them.  Some, such as Coumadin (Warfarin) are ordered to prevent or treat serious conditions such as "deep thrombosis", "pumonary embolisms", and other heart problems.  The amount of  time that you may need off of the medication may also vary with the medication and the reason for which you were taking it.  If you are taking any of these medications, please make sure you notify your pain physician before you undergo any procedures.         Radiofrequency Lesioning Radiofrequency lesioning is a procedure that is performed to relieve pain. The procedure is often used for back, neck, or arm pain. Radiofrequency lesioning involves the use of a machine that creates radio waves to make heat. During the procedure, the heat is applied to the nerve that carries the  pain signal. The heat damages the nerve and interferes with the pain signal. Pain relief usually lasts for 6 months to 1 year. LET [facility] CARE PROVIDER KNOW ABOUT: 2. Any allergies you have. 3. All medicines you are taking, including vitamins, herbs, eye drops, creams, and over-the-counter medicines. 4. Previous problems you or members of your family have had with the use of anesthetics. 5. Any blood disorders you have. 6. Previous surgeries you have had. 7. Any medical conditions you have. 8. Whether you are pregnant or may be pregnant. RISKS AND COMPLICATIONS Generally, this is a safe procedure. However, problems may occur, including:  Pain or soreness at the injection site.  Infection at the injection site.  Damage to nerves or blood vessels. BEFORE THE PROCEDURE  Ask your health care provider about:  Changing or stopping your regular medicines. This is especially important if you are taking diabetes medicines or blood thinners.  Taking medicines such as aspirin and ibuprofen. These medicines can thin your blood. Do not take these medicines before your procedure if your health care provider instructs you not to.  Follow instructions from your health care provider about eating or drinking restrictions.  Plan to have someone take you home after the procedure.  If you go home right after the procedure, plan to have someone with you for 24 hours. PROCEDURE  You will be given one or more of the following:  A medicine to help you relax (sedative).  A medicine to numb the area (local anesthetic).  You will be awake during the procedure. You will need to be able to talk with the health care provider during the procedure.  With the help of a type of X-ray (fluoroscopy), the health care provider will insert a radiofrequency needle into the area to be treated.  Next, a wire that carries the radio waves (electrode) will be put through the radiofrequency needle. An electrical  pulse will be sent through the electrode to verify the correct nerve. You will feel a tingling sensation, and you may have muscle twitching.  Then, the tissue that is around the needle tip will be heated by an electric current that is passed using the radiofrequency machine. This will numb the nerves.  A bandage (dressing) will be put on the insertion area after the procedure is done. The procedure may vary among health care providers and hospitals. AFTER THE PROCEDURE 12. Your blood pressure, heart rate, breathing rate, and blood oxygen level will be monitored often until the medicines you were given have worn off. 13. Return to your normal activities as directed by your health care provider.   This information is not intended to replace advice given to you by your health care provider. Make sure you discuss any questions you have with your health care provider.   Document Released: 05/05/2011 Document Revised: 05/28/2015 Document Reviewed: 10/14/2014 Elsevier Interactive  Patient Education 2016 [place].    DO NOT EAT OR DRINK FOR 8 HOURS PRIOR TO PROCEDURE BRING A DRIVER

## 2016-04-07 NOTE — Patient Instructions (Addendum)
GENERAL RISKS AND COMPLICATIONS  What are the risk, side effects and possible complications? Generally speaking, most procedures are safe.  However, with any procedure there are risks, side effects, and the possibility of complications.  The risks and complications are dependent upon the sites that are lesioned, or the type of nerve block to be performed.  The closer the procedure is to the spine, the more serious the risks are.  Great care is taken when placing the radio frequency needles, block needles or lesioning probes, but sometimes complications can occur. 1. Infection: Any time there is an injection through the skin, there is a risk of infection.  This is why sterile conditions are used for these blocks.  There are four possible types of infection. 1. Localized skin infection. 2. Central Nervous System Infection-This can be in the form of Meningitis, which can be deadly. 3. Epidural Infections-This can be in the form of an epidural abscess, which can cause pressure inside of the spine, causing compression of the spinal cord with subsequent paralysis. This would require an emergency surgery to decompress, and there are no guarantees that the patient would recover from the paralysis. 4. Discitis-This is an infection of the intervertebral discs.  It occurs in about 1% of discography procedures.  It is difficult to treat and it may lead to surgery.        2. Pain: the needles have to go through skin and soft tissues, will cause soreness.       3. Damage to internal structures:  The nerves to be lesioned may be near blood vessels or    other nerves which can be potentially damaged.       4. Bleeding: Bleeding is more common if the patient is taking blood thinners such as  aspirin, Coumadin, Ticiid, Plavix, etc., or if he/she have some genetic predisposition  such as hemophilia. Bleeding into the spinal canal can cause compression of the spinal  cord with subsequent paralysis.  This would require an  emergency surgery to  decompress and there are no guarantees that the patient would recover from the  paralysis.       5. Pneumothorax:  Puncturing of a lung is a possibility, every time a needle is introduced in  the area of the chest or upper back.  Pneumothorax refers to free air around the  collapsed lung(s), inside of the thoracic cavity (chest cavity).  Another two possible  complications related to a similar event would include: Hemothorax and Chylothorax.   These are variations of the Pneumothorax, where instead of air around the collapsed  lung(s), you may have blood or chyle, respectively.       6. Spinal headaches: They may occur with any procedures in the area of the spine.       7. Persistent CSF (Cerebro-Spinal Fluid) leakage: This is a rare problem, but may occur  with prolonged intrathecal or epidural catheters either due to the formation of a fistulous  track or a dural tear.       8. Nerve damage: By working so close to the spinal cord, there is always a possibility of  nerve damage, which could be as serious as a permanent spinal cord injury with  paralysis.       9. Death:  Although rare, severe deadly allergic reactions known as "Anaphylactic  reaction" can occur to any of the medications used.      10. Worsening of the symptoms:  We can always make thing worse.    What are the chances of something like this happening? Chances of any of this occuring are extremely low.  By statistics, you have more of a chance of getting killed in a motor vehicle accident: while driving to the hospital than any of the above occurring .  Nevertheless, you should be aware that they are possibilities.  In general, it is similar to taking a shower.  Everybody knows that you can slip, hit your head and get killed.  Does that mean that you should not shower again?  Nevertheless always keep in mind that statistics do not mean anything if you happen to be on the wrong side of them.  Even if a procedure has a 1  (one) in a 1,000,000 (million) chance of going wrong, it you happen to be that one..Also, keep in mind that by statistics, you have more of a chance of having something go wrong when taking medications.  Who should not have this procedure? If you are on a blood thinning medication (e.g. Coumadin, Plavix, see list of "Blood Thinners"), or if you have an active infection going on, you should not have the procedure.  If you are taking any blood thinners, please inform your physician.  How should I prepare for this procedure?  Do not eat or drink anything at least six hours prior to the procedure.  Bring a driver with you .  It cannot be a taxi.  Come accompanied by an adult that can drive you back, and that is strong enough to help you if your legs get weak or numb from the local anesthetic.  Take all of your medicines the morning of the procedure with just enough water to swallow them.  If you have diabetes, make sure that you are scheduled to have your procedure done first thing in the morning, whenever possible.  If you have diabetes, take only half of your insulin dose and notify our nurse that you have done so as soon as you arrive at the clinic.  If you are diabetic, but only take blood sugar pills (oral hypoglycemic), then do not take them on the morning of your procedure.  You may take them after you have had the procedure.  Do not take aspirin or any aspirin-containing medications, at least eleven (11) days prior to the procedure.  They may prolong bleeding.  Wear loose fitting clothing that may be easy to take off and that you would not mind if it got stained with Betadine or blood.  Do not wear any jewelry or perfume  Remove any nail coloring.  It will interfere with some of our monitoring equipment.  NOTE: Remember that this is not meant to be interpreted as a complete list of all possible complications.  Unforeseen problems may occur.  BLOOD THINNERS The following drugs  contain aspirin or other products, which can cause increased bleeding during surgery and should not be taken for 2 weeks prior to and 1 week after surgery.  If you should need take something for relief of minor pain, you may take acetaminophen which is found in Tylenol,m Datril, Anacin-3 and Panadol. It is not blood thinner. The products listed below are.  Do not take any of the products listed below in addition to any listed on your instruction sheet.  A.P.C or A.P.C with Codeine Codeine Phosphate Capsules #3 Ibuprofen Ridaura  ABC compound Congesprin Imuran rimadil  Advil Cope Indocin Robaxisal  Alka-Seltzer Effervescent Pain Reliever and Antacid Coricidin or Coricidin-D  Indomethacin Rufen    Alka-Seltzer plus Cold Medicine Cosprin Ketoprofen S-A-C Tablets  Anacin Analgesic Tablets or Capsules Coumadin Korlgesic Salflex  Anacin Extra Strength Analgesic tablets or capsules CP-2 Tablets Lanoril Salicylate  Anaprox Cuprimine Capsules Levenox Salocol  Anexsia-D Dalteparin Magan Salsalate  Anodynos Darvon compound Magnesium Salicylate Sine-off  Ansaid Dasin Capsules Magsal Sodium Salicylate  Anturane Depen Capsules Marnal Soma  APF Arthritis pain formula Dewitt's Pills Measurin Stanback  Argesic Dia-Gesic Meclofenamic Sulfinpyrazone  Arthritis Bayer Timed Release Aspirin Diclofenac Meclomen Sulindac  Arthritis pain formula Anacin Dicumarol Medipren Supac  Analgesic (Safety coated) Arthralgen Diffunasal Mefanamic Suprofen  Arthritis Strength Bufferin Dihydrocodeine Mepro Compound Suprol  Arthropan liquid Dopirydamole Methcarbomol with Aspirin Synalgos  ASA tablets/Enseals Disalcid Micrainin Tagament  Ascriptin Doan's Midol Talwin  Ascriptin A/D Dolene Mobidin Tanderil  Ascriptin Extra Strength Dolobid Moblgesic Ticlid  Ascriptin with Codeine Doloprin or Doloprin with Codeine Momentum Tolectin  Asperbuf Duoprin Mono-gesic Trendar  Aspergum Duradyne Motrin or Motrin IB Triminicin  Aspirin  plain, buffered or enteric coated Durasal Myochrisine Trigesic  Aspirin Suppositories Easprin Nalfon Trillsate  Aspirin with Codeine Ecotrin Regular or Extra Strength Naprosyn Uracel  Atromid-S Efficin Naproxen Ursinus  Auranofin Capsules Elmiron Neocylate Vanquish  Axotal Emagrin Norgesic Verin  Azathioprine Empirin or Empirin with Codeine Normiflo Vitamin E  Azolid Emprazil Nuprin Voltaren  Bayer Aspirin plain, buffered or children's or timed BC Tablets or powders Encaprin Orgaran Warfarin Sodium  Buff-a-Comp Enoxaparin Orudis Zorpin  Buff-a-Comp with Codeine Equegesic Os-Cal-Gesic   Buffaprin Excedrin plain, buffered or Extra Strength Oxalid   Bufferin Arthritis Strength Feldene Oxphenbutazone   Bufferin plain or Extra Strength Feldene Capsules Oxycodone with Aspirin   Bufferin with Codeine Fenoprofen Fenoprofen Pabalate or Pabalate-SF   Buffets II Flogesic Panagesic   Buffinol plain or Extra Strength Florinal or Florinal with Codeine Panwarfarin   Buf-Tabs Flurbiprofen Penicillamine   Butalbital Compound Four-way cold tablets Penicillin   Butazolidin Fragmin Pepto-Bismol   Carbenicillin Geminisyn Percodan   Carna Arthritis Reliever Geopen Persantine   Carprofen Gold's salt Persistin   Chloramphenicol Goody's Phenylbutazone   Chloromycetin Haltrain Piroxlcam   Clmetidine heparin Plaquenil   Cllnoril Hyco-pap Ponstel   Clofibrate Hydroxy chloroquine Propoxyphen         Before stopping any of these medications, be sure to consult the physician who ordered them.  Some, such as Coumadin (Warfarin) are ordered to prevent or treat serious conditions such as "deep thrombosis", "pumonary embolisms", and other heart problems.  The amount of time that you may need off of the medication may also vary with the medication and the reason for which you were taking it.  If you are taking any of these medications, please make sure you notify your pain physician before you undergo any  procedures.         Radiofrequency Lesioning Radiofrequency lesioning is a procedure that is performed to relieve pain. The procedure is often used for back, neck, or arm pain. Radiofrequency lesioning involves the use of a machine that creates radio waves to make heat. During the procedure, the heat is applied to the nerve that carries the pain signal. The heat damages the nerve and interferes with the pain signal. Pain relief usually lasts for 6 months to 1 year. LET YOUR HEALTH CARE PROVIDER KNOW ABOUT: 2. Any allergies you have. 3. All medicines you are taking, including vitamins, herbs, eye drops, creams, and over-the-counter medicines. 4. Previous problems you or members of your family have had with the use of anesthetics. 5. Any blood disorders you have.   6. Previous surgeries you have had. 7. Any medical conditions you have. 8. Whether you are pregnant or may be pregnant. RISKS AND COMPLICATIONS Generally, this is a safe procedure. However, problems may occur, including:  Pain or soreness at the injection site.  Infection at the injection site.  Damage to nerves or blood vessels. BEFORE THE PROCEDURE  Ask your health care provider about:  Changing or stopping your regular medicines. This is especially important if you are taking diabetes medicines or blood thinners.  Taking medicines such as aspirin and ibuprofen. These medicines can thin your blood. Do not take these medicines before your procedure if your health care provider instructs you not to.  Follow instructions from your health care provider about eating or drinking restrictions.  Plan to have someone take you home after the procedure.  If you go home right after the procedure, plan to have someone with you for 24 hours. PROCEDURE  You will be given one or more of the following:  A medicine to help you relax (sedative).  A medicine to numb the area (local anesthetic).  You will be awake during the  procedure. You will need to be able to talk with the health care provider during the procedure.  With the help of a type of X-ray (fluoroscopy), the health care provider will insert a radiofrequency needle into the area to be treated.  Next, a wire that carries the radio waves (electrode) will be put through the radiofrequency needle. An electrical pulse will be sent through the electrode to verify the correct nerve. You will feel a tingling sensation, and you may have muscle twitching.  Then, the tissue that is around the needle tip will be heated by an electric current that is passed using the radiofrequency machine. This will numb the nerves.  A bandage (dressing) will be put on the insertion area after the procedure is done. The procedure may vary among health care providers and hospitals. AFTER THE PROCEDURE 12. Your blood pressure, heart rate, breathing rate, and blood oxygen level will be monitored often until the medicines you were given have worn off. 13. Return to your normal activities as directed by your health care provider.   This information is not intended to replace advice given to you by your health care provider. Make sure you discuss any questions you have with your health care provider.   Document Released: 05/05/2011 Document Revised: 05/28/2015 Document Reviewed: 10/14/2014 Elsevier Interactive Patient Education 2016 [employer]    DO NOT EAT OR DRINK FOR 8 HOURS PRIOR TO PROCEDURE BRING A DRIVER

## 2016-05-19 ENCOUNTER — Encounter: Payer: Self-pay | Admitting: Family Medicine

## 2016-05-19 ENCOUNTER — Ambulatory Visit (INDEPENDENT_AMBULATORY_CARE_PROVIDER_SITE_OTHER): Payer: Worker's Compensation | Admitting: Family Medicine

## 2016-05-19 VITALS — BP 120/88 | HR 100 | Temp 98.4°F | Ht 62.0 in | Wt 193.0 lb

## 2016-05-19 DIAGNOSIS — M545 Low back pain, unspecified: Secondary | ICD-10-CM

## 2016-05-19 DIAGNOSIS — M791 Myalgia: Secondary | ICD-10-CM | POA: Diagnosis not present

## 2016-05-19 DIAGNOSIS — M609 Myositis, unspecified: Secondary | ICD-10-CM

## 2016-05-19 DIAGNOSIS — IMO0001 Reserved for inherently not codable concepts without codable children: Secondary | ICD-10-CM

## 2016-05-19 DIAGNOSIS — L989 Disorder of the skin and subcutaneous tissue, unspecified: Secondary | ICD-10-CM | POA: Diagnosis not present

## 2016-05-19 DIAGNOSIS — I10 Essential (primary) hypertension: Secondary | ICD-10-CM | POA: Diagnosis not present

## 2016-05-19 DIAGNOSIS — G8929 Other chronic pain: Secondary | ICD-10-CM

## 2016-05-19 DIAGNOSIS — G8918 Other acute postprocedural pain: Secondary | ICD-10-CM

## 2016-05-19 MED ORDER — OXYCODONE HCL 5 MG PO TABS: 5.0000 mg | ORAL_TABLET | Freq: Three times a day (TID) | ORAL | 0 refills | Status: DC | PRN

## 2016-05-19 MED ORDER — OXYMORPHONE HCL ER 5 MG PO TB12: ORAL_TABLET | ORAL | 0 refills | Status: DC

## 2016-05-19 NOTE — Progress Notes (Signed)
   Subjective:    Patient ID: Ashley [name], female    DOB: April 07, 1961, 55 y.o.   MRN: [id number]  HPI Here to follow up on chronic back pain and fibromyalgia. She recently had a radiofrequency ablation on one side of the lumbar spine per Dr. Dossie Arbour, and next week she will have the same done to the other side. She asks me to check on a lesion on her right arm that appeared suddenly one month ago.    Review of Systems  Constitutional: Negative.   Respiratory: Negative.   Cardiovascular: Negative.   Musculoskeletal: Positive for back pain and myalgias.  Neurological: Negative.        Objective:   Physical Exam  Constitutional: She is oriented to person, place, and time. She appears well-developed and well-nourished.  Cardiovascular: Normal rate, regular rhythm, normal heart sounds and intact distal pulses.   Pulmonary/Chest: Effort normal and breath sounds normal.  Musculoskeletal:  The right forearm has a well defined dark brown nodular lesion   Neurological: She is alert and oriented to person, place, and time.          Assessment & Plan:  For the back pain and fibromyalgia, we refilled her meds. The skin lesion looks like a seborrheic keratosis , but the fact tat it is growing so rapidly is concerning. We will arrange for Dermatology to check this.  Laurey Morale, MD

## 2016-05-19 NOTE — Progress Notes (Signed)
Pre visit review using our clinic review tool, if applicable. No additional management support is needed unless otherwise documented below in the visit note. 

## 2016-05-25 ENCOUNTER — Encounter: Payer: Self-pay | Admitting: Pain Medicine

## 2016-05-25 ENCOUNTER — Ambulatory Visit: Payer: Worker's Compensation | Attending: Pain Medicine | Admitting: Pain Medicine

## 2016-05-25 VITALS — BP 139/64 | HR 72 | Temp 96.5°F | Resp 14 | Ht 62.0 in | Wt 190.0 lb

## 2016-05-25 DIAGNOSIS — K219 Gastro-esophageal reflux disease without esophagitis: Secondary | ICD-10-CM | POA: Diagnosis not present

## 2016-05-25 DIAGNOSIS — J309 Allergic rhinitis, unspecified: Secondary | ICD-10-CM | POA: Insufficient documentation

## 2016-05-25 DIAGNOSIS — M791 Myalgia: Secondary | ICD-10-CM | POA: Diagnosis not present

## 2016-05-25 DIAGNOSIS — M47816 Spondylosis without myelopathy or radiculopathy, lumbar region: Secondary | ICD-10-CM

## 2016-05-25 DIAGNOSIS — M545 Low back pain, unspecified: Secondary | ICD-10-CM

## 2016-05-25 DIAGNOSIS — M722 Plantar fascial fibromatosis: Secondary | ICD-10-CM | POA: Diagnosis not present

## 2016-05-25 DIAGNOSIS — K589 Irritable bowel syndrome without diarrhea: Secondary | ICD-10-CM | POA: Diagnosis not present

## 2016-05-25 DIAGNOSIS — J45909 Unspecified asthma, uncomplicated: Secondary | ICD-10-CM | POA: Insufficient documentation

## 2016-05-25 DIAGNOSIS — Z79891 Long term (current) use of opiate analgesic: Secondary | ICD-10-CM | POA: Diagnosis not present

## 2016-05-25 DIAGNOSIS — F329 Major depressive disorder, single episode, unspecified: Secondary | ICD-10-CM | POA: Insufficient documentation

## 2016-05-25 DIAGNOSIS — I10 Essential (primary) hypertension: Secondary | ICD-10-CM | POA: Insufficient documentation

## 2016-05-25 DIAGNOSIS — G8929 Other chronic pain: Secondary | ICD-10-CM | POA: Insufficient documentation

## 2016-05-25 DIAGNOSIS — A6000 Herpesviral infection of urogenital system, unspecified: Secondary | ICD-10-CM | POA: Diagnosis not present

## 2016-05-25 DIAGNOSIS — N3281 Overactive bladder: Secondary | ICD-10-CM | POA: Insufficient documentation

## 2016-05-25 DIAGNOSIS — G8918 Other acute postprocedural pain: Secondary | ICD-10-CM | POA: Insufficient documentation

## 2016-05-25 MED ORDER — MIDAZOLAM HCL 5 MG/5ML IJ SOLN
1.0000 mg | INTRAMUSCULAR | Status: DC | PRN
  Filled 2016-05-25: qty 5

## 2016-05-25 MED ORDER — ROPIVACAINE HCL 2 MG/ML IJ SOLN
9.0000 mL | Freq: Once | INTRAMUSCULAR | Status: AC
  Administered 2016-05-25: 9 mL
  Filled 2016-05-25: qty 10

## 2016-05-25 MED ORDER — LIDOCAINE HCL (PF) 1 % IJ SOLN
10.0000 mL | Freq: Once | INTRAMUSCULAR | Status: AC
  Administered 2016-05-25: 10 mL

## 2016-05-25 MED ORDER — FENTANYL CITRATE (PF) 100 MCG/2ML IJ SOLN
25.0000 ug | INTRAMUSCULAR | Status: DC | PRN
  Filled 2016-05-25: qty 2

## 2016-05-25 MED ORDER — LACTATED RINGERS IV SOLN: 1000.0000 mL | Freq: Once | INTRAVENOUS | Status: DC

## 2016-05-25 MED ORDER — OXYCODONE HCL 5 MG PO TABS: 5.0000 mg | ORAL_TABLET | Freq: Four times a day (QID) | ORAL | 0 refills | Status: DC | PRN

## 2016-05-25 MED ORDER — TRIAMCINOLONE ACETONIDE 40 MG/ML IJ SUSP
40.0000 mg | Freq: Once | INTRAMUSCULAR | Status: AC
  Administered 2016-05-25: 40 mg
  Filled 2016-05-25: qty 1

## 2016-05-25 NOTE — Patient Instructions (Signed)
Radiofrequency Lesioning, Care After Refer to this sheet in the next few weeks. These instructions provide you with information about caring for yourself after your procedure. Your health care provider may also give you more specific instructions. Your treatment has been planned according to current medical practices, but problems sometimes occur. Call your health care provider if you have any problems or questions after your procedure. WHAT TO EXPECT AFTER THE PROCEDURE After your procedure, it is common to have:  Pain from the burned nerve.  Temporary numbness. HOME CARE INSTRUCTIONS  Take over-the-counter and prescription medicines only as told by your health care provider.  Return to your normal activities as told by your health care provider. Ask your health care provider what activities are safe for you.  Pay close attention to how you feel after the procedure. If you start to have pain, write down when it hurts and how it feels. This will help you and your health care provider to know if you need an additional treatment.  Check your needle insertion site every day for signs of infection. Watch for:  Redness, swelling, or pain.  Fluid, blood, or pus.  Keep all follow-up visits as told by your health care provider. This is important. SEEK MEDICAL CARE IF:  Your pain does not get better.  You have redness, swelling, or pain at the needle insertion site.  You have fluid, blood, or pus coming from the needle insertion site.  You have a fever. SEEK IMMEDIATE MEDICAL CARE IF:  You develop sudden, severe pain.  You develop numbness or tingling near the procedure site that does not go away.   This information is not intended to replace advice given to you by your health care provider. Make sure you discuss any questions you have with your health care provider.   Document Released: 05/05/2011 Document Revised: 05/28/2015 Document Reviewed: 10/14/2014 Elsevier Interactive Patient  Education 2016 [place]. Radiofrequency Lesioning Radiofrequency lesioning is a procedure that is performed to relieve pain. The procedure is often used for back, neck, or arm pain. Radiofrequency lesioning involves the use of a machine that creates radio waves to make heat. During the procedure, the heat is applied to the nerve that carries the pain signal. The heat damages the nerve and interferes with the pain signal. Pain relief usually lasts for 6 months to 1 year. LET [facility] CARE PROVIDER KNOW ABOUT:  Any allergies you have.  All medicines you are taking, including vitamins, herbs, eye drops, creams, and over-the-counter medicines.  Previous problems you or members of your family have had with the use of anesthetics.  Any blood disorders you have.  Previous surgeries you have had.  Any medical conditions you have.  Whether you are pregnant or may be pregnant. RISKS AND COMPLICATIONS Generally, this is a safe procedure. However, problems may occur, including:  Pain or soreness at the injection site.  Infection at the injection site.  Damage to nerves or blood vessels. BEFORE THE PROCEDURE  Ask your health care provider about:  Changing or stopping your regular medicines. This is especially important if you are taking diabetes medicines or blood thinners.  Taking medicines such as aspirin and ibuprofen. These medicines can thin your blood. Do not take these medicines before your procedure if your health care provider instructs you not to.  Follow instructions from your health care provider about eating or drinking restrictions.  Plan to have someone take you home after the procedure.  If you go home right  after the procedure, plan to have someone with you for 24 hours. PROCEDURE  You will be given one or more of the following:  A medicine to help you relax (sedative).  A medicine to numb the area (local anesthetic).  You will be awake during the procedure.  You will need to be able to talk with the health care provider during the procedure.  With the help of a type of X-ray (fluoroscopy), the health care provider will insert a radiofrequency needle into the area to be treated.  Next, a wire that carries the radio waves (electrode) will be put through the radiofrequency needle. An electrical pulse will be sent through the electrode to verify the correct nerve. You will feel a tingling sensation, and you may have muscle twitching.  Then, the tissue that is around the needle tip will be heated by an electric current that is passed using the radiofrequency machine. This will numb the nerves.  A bandage (dressing) will be put on the insertion area after the procedure is done. The procedure may vary among health care providers and hospitals. AFTER THE PROCEDURE  Your blood pressure, heart rate, breathing rate, and blood oxygen level will be monitored often until the medicines you were given have worn off.  Return to your normal activities as directed by your health care provider.   This information is not intended to replace advice given to you by your health care provider. Make sure you discuss any questions you have with your health care provider.   Document Released: 05/05/2011 Document Revised: 05/28/2015 Document Reviewed: 10/14/2014 Elsevier Interactive Patient Education 2016 [employer] Pain Management Discharge Instructions  General Discharge Instructions :  If you need to reach your doctor call: Monday-Friday 8:00 am - 4:00 pm at [phone] or toll free [phone].  After clinic hours [phone] to have operator reach doctor.  Bring all of your medication bottles to all your appointments in the pain clinic.  To cancel or reschedule your appointment with Pain Management please remember to call 24 hours in advance to avoid a fee.  Refer to the educational materials which you have been given on: General Risks, I had my  Procedure. Discharge Instructions, Post Sedation.  Post Procedure Instructions:  The drugs you were given will stay in your system until tomorrow, so for the next 24 hours you should not drive, make any legal decisions or drink any alcoholic beverages.  You may eat anything you prefer, but it is better to start with liquids then soups and crackers, and gradually work up to solid foods.  Please notify your doctor immediately if you have any unusual bleeding, trouble breathing or pain that is not related to your normal pain.  Depending on the type of procedure that was done, some parts of your body may feel week and/or numb.  This usually clears up by tonight or the next day.  Walk with the use of an assistive device or accompanied by an adult for the 24 hours.  You may use ice on the affected area for the first 24 hours.  Put ice in a Ziploc bag and cover with a towel and place against area 15 minutes on 15 minutes off.  You may switch to heat after 24 hours.GENERAL RISKS AND COMPLICATIONS  What are the risk, side effects and possible complications? Generally speaking, most procedures are safe.  However, with any procedure there are risks, side effects, and the possibility of complications.  The risks and complications are dependent  upon the sites that are lesioned, or the type of nerve block to be performed.  The closer the procedure is to the spine, the more serious the risks are.  Great care is taken when placing the radio frequency needles, block needles or lesioning probes, but sometimes complications can occur. 1. Infection: Any time there is an injection through the skin, there is a risk of infection.  This is why sterile conditions are used for these blocks.  There are four possible types of infection. 1. Localized skin infection. 2. Central Nervous System Infection-This can be in the form of Meningitis, which can be deadly. 3. Epidural Infections-This can be in the form of an epidural  abscess, which can cause pressure inside of the spine, causing compression of the spinal cord with subsequent paralysis. This would require an emergency surgery to decompress, and there are no guarantees that the patient would recover from the paralysis. 4. Discitis-This is an infection of the intervertebral discs.  It occurs in about 1% of discography procedures.  It is difficult to treat and it may lead to surgery.        2. Pain: the needles have to go through skin and soft tissues, will cause soreness.       3. Damage to internal structures:  The nerves to be lesioned may be near blood vessels or    other nerves which can be potentially damaged.       4. Bleeding: Bleeding is more common if the patient is taking blood thinners such as  aspirin, Coumadin, Ticiid, Plavix, etc., or if he/she have some genetic predisposition  such as hemophilia. Bleeding into the spinal canal can cause compression of the spinal  cord with subsequent paralysis.  This would require an emergency surgery to  decompress and there are no guarantees that the patient would recover from the  paralysis.       5. Pneumothorax:  Puncturing of a lung is a possibility, every time a needle is introduced in  the area of the chest or upper back.  Pneumothorax refers to free air around the  collapsed lung(s), inside of the thoracic cavity (chest cavity).  Another two possible  complications related to a similar event would include: Hemothorax and Chylothorax.   These are variations of the Pneumothorax, where instead of air around the collapsed  lung(s), you may have blood or chyle, respectively.       6. Spinal headaches: They may occur with any procedures in the area of the spine.       7. Persistent CSF (Cerebro-Spinal Fluid) leakage: This is a rare problem, but may occur  with prolonged intrathecal or epidural catheters either due to the formation of a fistulous  track or a dural tear.       8. Nerve damage: By working so close to the  spinal cord, there is always a possibility of  nerve damage, which could be as serious as a permanent spinal cord injury with  paralysis.       9. Death:  Although rare, severe deadly allergic reactions known as "Anaphylactic  reaction" can occur to any of the medications used.      10. Worsening of the symptoms:  We can always make thing worse.  What are the chances of something like this happening? Chances of any of this occuring are extremely low.  By statistics, you have more of a chance of getting killed in a motor vehicle accident: while driving to the hospital  than any of the above occurring .  Nevertheless, you should be aware that they are possibilities.  In general, it is similar to taking a shower.  Everybody knows that you can slip, hit your head and get killed.  Does that mean that you should not shower again?  Nevertheless always keep in mind that statistics do not mean anything if you happen to be on the wrong side of them.  Even if a procedure has a 1 (one) in a 1,000,000 (million) chance of going wrong, it you happen to be that one..Also, keep in mind that by statistics, you have more of a chance of having something go wrong when taking medications.  Who should not have this procedure? If you are on a blood thinning medication (e.g. Coumadin, Plavix, see list of "Blood Thinners"), or if you have an active infection going on, you should not have the procedure.  If you are taking any blood thinners, please inform your physician.  How should I prepare for this procedure?  Do not eat or drink anything at least six hours prior to the procedure.  Bring a driver with you .  It cannot be a taxi.  Come accompanied by an adult that can drive you back, and that is strong enough to help you if your legs get weak or numb from the local anesthetic.  Take all of your medicines the morning of the procedure with just enough water to swallow them.  If you have diabetes, make sure that you are  scheduled to have your procedure done first thing in the morning, whenever possible.  If you have diabetes, take only half of your insulin dose and notify our nurse that you have done so as soon as you arrive at the clinic.  If you are diabetic, but only take blood sugar pills (oral hypoglycemic), then do not take them on the morning of your procedure.  You may take them after you have had the procedure.  Do not take aspirin or any aspirin-containing medications, at least eleven (11) days prior to the procedure.  They may prolong bleeding.  Wear loose fitting clothing that may be easy to take off and that you would not mind if it got stained with Betadine or blood.  Do not wear any jewelry or perfume  Remove any nail coloring.  It will interfere with some of our monitoring equipment.  NOTE: Remember that this is not meant to be interpreted as a complete list of all possible complications.  Unforeseen problems may occur.  BLOOD THINNERS The following drugs contain aspirin or other products, which can cause increased bleeding during surgery and should not be taken for 2 weeks prior to and 1 week after surgery.  If you should need take something for relief of minor pain, you may take acetaminophen which is found in Tylenol,m Datril, Anacin-3 and Panadol. It is not blood thinner. The products listed below are.  Do not take any of the products listed below in addition to any listed on your instruction sheet.  A.P.C or A.P.C with Codeine Codeine Phosphate Capsules #3 Ibuprofen Ridaura  ABC compound Congesprin Imuran rimadil  Advil Cope Indocin Robaxisal  Alka-Seltzer Effervescent Pain Reliever and Antacid Coricidin or Coricidin-D  Indomethacin Rufen  Alka-Seltzer plus Cold Medicine Cosprin Ketoprofen S-A-C Tablets  Anacin Analgesic Tablets or Capsules Coumadin Korlgesic Salflex  Anacin Extra Strength Analgesic tablets or capsules CP-2 Tablets Lanoril Salicylate  Anaprox Cuprimine Capsules  Levenox Salocol  Anexsia-D Dalteparin Magan Salsalate  Anodynos Darvon compound Magnesium Salicylate Sine-off  Ansaid Dasin Capsules Magsal Sodium Salicylate  Anturane Depen Capsules Marnal Soma  APF Arthritis pain formula Dewitt's Pills Measurin Stanback  Argesic Dia-Gesic Meclofenamic Sulfinpyrazone  Arthritis Bayer Timed Release Aspirin Diclofenac Meclomen Sulindac  Arthritis pain formula Anacin Dicumarol Medipren Supac  Analgesic (Safety coated) Arthralgen Diffunasal Mefanamic Suprofen  Arthritis Strength Bufferin Dihydrocodeine Mepro Compound Suprol  Arthropan liquid Dopirydamole Methcarbomol with Aspirin Synalgos  ASA tablets/Enseals Disalcid Micrainin Tagament  Ascriptin Doan's Midol Talwin  Ascriptin A/D Dolene Mobidin Tanderil  Ascriptin Extra Strength Dolobid Moblgesic Ticlid  Ascriptin with Codeine Doloprin or Doloprin with Codeine Momentum Tolectin  Asperbuf Duoprin Mono-gesic Trendar  Aspergum Duradyne Motrin or Motrin IB Triminicin  Aspirin plain, buffered or enteric coated Durasal Myochrisine Trigesic  Aspirin Suppositories Easprin Nalfon Trillsate  Aspirin with Codeine Ecotrin Regular or Extra Strength Naprosyn Uracel  Atromid-S Efficin Naproxen Ursinus  Auranofin Capsules Elmiron Neocylate Vanquish  Axotal Emagrin Norgesic Verin  Azathioprine Empirin or Empirin with Codeine Normiflo Vitamin E  Azolid Emprazil Nuprin Voltaren  Bayer Aspirin plain, buffered or children's or timed BC Tablets or powders Encaprin Orgaran Warfarin Sodium  Buff-a-Comp Enoxaparin Orudis Zorpin  Buff-a-Comp with Codeine Equegesic Os-Cal-Gesic   Buffaprin Excedrin plain, buffered or Extra Strength Oxalid   Bufferin Arthritis Strength Feldene Oxphenbutazone   Bufferin plain or Extra Strength Feldene Capsules Oxycodone with Aspirin   Bufferin with Codeine Fenoprofen Fenoprofen Pabalate or Pabalate-SF   Buffets II Flogesic Panagesic   Buffinol plain or Extra Strength Florinal or Florinal with  Codeine Panwarfarin   Buf-Tabs Flurbiprofen Penicillamine   Butalbital Compound Four-way cold tablets Penicillin   Butazolidin Fragmin Pepto-Bismol   Carbenicillin Geminisyn Percodan   Carna Arthritis Reliever Geopen Persantine   Carprofen Gold's salt Persistin   Chloramphenicol Goody's Phenylbutazone   Chloromycetin Haltrain Piroxlcam   Clmetidine heparin Plaquenil   Cllnoril Hyco-pap Ponstel   Clofibrate Hydroxy chloroquine Propoxyphen         Before stopping any of these medications, be sure to consult the physician who ordered them.  Some, such as Coumadin (Warfarin) are ordered to prevent or treat serious conditions such as "deep thrombosis", "pumonary embolisms", and other heart problems.  The amount of time that you may need off of the medication may also vary with the medication and the reason for which you were taking it.  If you are taking any of these medications, please make sure you notify your pain physician before you undergo any procedures.

## 2016-05-25 NOTE — Progress Notes (Signed)
Patient's Name: Ashley [name]  Patient type: Established  MRN: [id number]  Service setting: Ambulatory outpatient  DOB: 1961/01/16  Location: [facility]  DOS: 05/25/2016  Primary Care Physician: Ashley Morale, MD  Note by: Ashley [name], M.D, DABA, Ashley [name], DABPM, Ashley [name], [place]  Referring Physician: Laurey Morale, MD  Specialty: Board-Certified Interventional Pain Management  Last Visit to Pain Management: 04/07/2016   Primary Reason(s) for Visit: Interventional Pain Management Treatment. CC: Back Pain (low)  Facet syndrome, lumbar [M54.5]   Procedure:  Anesthesia, Analgesia, Anxiolysis:  Type: Therapeutic Medial Branch Facet Radiofrequency Ablation Region: Lumbar Level: L2, L3, L4, L5, & S1 Medial Branch Level(s) Laterality: Left-Sided  Indications: 1. Lumbar facet syndrome (Location of Primary Source of Pain) (Bilateral) (R>L)   2. Chronic low back pain (Location of Primary Source of Pain) (Bilateral) (R>L)   3. Lumbar spondylosis, unspecified spinal osteoarthritis   4. Acute postoperative pain     Pre-procedure Pain Score: 2 Reported level of pain is compatible with clinical observations Post-procedure Pain Score: 0-No pain  The patient has failed to respond to conservative therapies including over-the-counter medications, anti-inflammatories, muscle relaxants, membrane stabilizers, opioids, physical therapy, modalities such as heat and ice, as well as more invasive techniques such as nerve blocks. The patient did attained more than 50% relief of the pain from a series of diagnostic injections conducted in separate occasions.  Type: Moderate (Conscious) Sedation & Local Anesthesia Local Anesthetic: Lidocaine 1% Route: Intravenous (IV) IV Access: Secured Sedation: Meaningful verbal contact was maintained at all times during the procedure  Indication(s): Analgesia & Anxiolysis   Pre-Procedure Assessment:  [name] is a 55 y.o. year old, female  patient, seen today for interventional treatment. She has Genital herpes; Depression; Essential hypertension; Allergic rhinitis; Asthma; GERD; IBS; Overactive bladder; Plantar fasciitis; Myalgia and myositis; HTN (hypertension); Chronic low back pain (Location of Primary Source of Pain) (Bilateral) (R>L); Lumbar spondylosis; Lumbar facet syndrome (Location of Primary Source of Pain) (Bilateral) (R>L); Chronic pain; Long term current use of opiate analgesic; Long term prescription opiate use; Opiate use (112.5 MME/Day); Encounter for therapeutic drug level monitoring; Pain management; and Acute postoperative pain on her problem list.. Her primarily concern today is the Back Pain (low)   Pain Type: Chronic pain Pain Location: Back Pain Orientation: Lower Pain Descriptors / Indicators: Aching, Dull Pain Frequency: Constant  Date of Last Visit: 04/07/16 Service Provided on Last Visit: Evaluation  Coagulation Parameters Lab Results  Component Value Date   PLT 279.0 10/09/2015    Verification of the correct person, correct site (including marking of site), and correct procedure were performed and confirmed by the patient.  Consent: Secured. Under the influence of no sedatives a written informed consent was obtained, after having provided information on the risks and possible complications. To fulfill our ethical and legal obligations, as recommended by the American Medical Association's Code of Ethics, we have provided information to the patient about our clinical impression; the nature and purpose of the treatment or procedure; the risks, benefits, and possible complications of the intervention; alternatives; the risk(s) and benefit(s) of the alternative treatment(s) or procedure(s); and the risk(s) and benefit(s) of doing nothing. The patient was provided information about the risks and possible complications associated with the procedure. These include, but are not limited to, failure to achieve  desired goals, infection, bleeding, organ or nerve damage, allergic reactions, paralysis, and death. In the case of spinal procedures these may include, but are not limited to, failure to  achieve desired goals, infection, bleeding, organ or nerve damage, allergic reactions, paralysis, and death. In addition, the patient was informed that Medicine is not an exact science; therefore, there is also the possibility of unforeseen risks and possible complications that may result in a catastrophic outcome. The patient indicated having understood very clearly. We have given the patient no guarantees and we have made no promises. Enough time was given to the patient to ask questions, all of which were answered to the patient's satisfaction.  Consent Attestation: I, the ordering provider, attest that I have discussed with the patient the benefits, risks, side-effects, alternatives, likelihood of achieving goals, and potential problems during recovery for the procedure that I have provided informed consent.  Pre-Procedure Preparation: Safety Precautions: Allergies reviewed. Appropriate site, procedure, and patient were confirmed by following the Joint Commission's Universal Protocol (UP.01.01.01), in the form of a "Time Out". The patient was asked to confirm marked site and procedure, before commencing. The patient was asked about blood thinners, or active infections, both of which were denied. Patient was assessed for positional comfort and all pressure points were checked before starting procedure. Allergies: She is allergic to aspirin; ciprofloxacin; gabapentin; and levetiracetam.. Infection Control Precautions: Aseptic technique used. Standard Universal Precautions were taken as recommended by the Department of [facility] for Disease Control and Prevention (CDC). Standard pre-surgical skin prep was conducted. Respiratory hygiene and cough etiquette was practiced. Hand hygiene observed. Safe injection  practices and needle disposal techniques followed. SDV (single dose vial) medications used. Medications properly checked for expiration dates and contaminants. Personal protective equipment (PPE) used: Sterile Radiation-resistant gloves. Monitoring:  As per clinic protocol. Vitals:   05/25/16 1216 05/25/16 1217 05/25/16 1227 05/25/16 1237  BP: 137/80 (!) 119/56 133/63 139/64  Pulse: 77 78 74 72  Resp: 12 13 14 14   Temp:  (!) 96.5 F (35.8 C)    SpO2: 94% 95% 98% 96%  Weight:      Height:      Calculated BMI: Body mass index is 34.75 kg/m.  Description of Procedure Process:   Time-out: "Time-out" completed before starting procedure, as per protocol. Position: Prone Target Area: For Lumbar Facet blocks, the target is the groove formed by the junction of the transverse process and superior articular process. For the L5 dorsal ramus, the target is the notch between superior articular process and sacral ala. For the S1 dorsal ramus, the target is the superior and lateral edge of the posterior S1 Sacral foramen. Approach: Paraspinal approach. Area Prepped: Entire Posterior Lumbosacral Region Prepping solution: ChloraPrep (2% chlorhexidine gluconate and 70% isopropyl alcohol) Safety Precautions: Aspiration looking for blood return was conducted prior to all injections. At no point did we inject any substances, as a needle was being advanced. No attempts were made at seeking any paresthesias. Safe injection practices and needle disposal techniques used. Medications properly checked for expiration dates. SDV (single dose vial) medications used.   Description of the Procedure: Protocol guidelines were followed. The patient was placed in position over the fluoroscopy table. The target area was identified and the area prepped in the usual manner. Skin desensitized using vapocoolant spray. Skin & deeper tissues infiltrated with local anesthetic. Appropriate amount of time allowed to pass for local  anesthetics to take effect. Radiofrequency needles were introduced to the area of the medial branch at the junction of the superior articular process and transverse process using fluoroscopy. Using the [employer], sensory stimulation using 50 Hz was used to locate &  identify the nerve, making sure that the needle was positioned such that there was no sensory stimulation below 0.3 V or above 0.7 V. Stimulation using 2 Hz was used to evaluate the motor component. Care was taken not to lesion any nerves that demonstrated motor stimulation of the lower extremities at an output of less than 2.5 times that of the sensory threshold, or a maximum of 2.0 V. Once satisfactory placement of the needles was achieved, the above solution was slowly injected after negative aspiration. After waiting for at least 2 minutes, the ablation was performed at 80 degrees C for 60 seconds.The needles were then removed and the area cleansed, making sure to leave some of the prepping solution back to take advantage of its long term bactericidal properties. EBL: None Materials & Medications Used:  Needle(s) Used: Teflon-Coated Radiofrequency Needles  Imaging Guidance:   Type of Imaging Technique: Fluoroscopy Guidance (Spinal) Indication(s): Assistance in needle guidance and placement for procedures requiring needle placement in or near specific anatomical locations not easily accessible without such assistance. Exposure Time: Please see nurses notes. Contrast: None required. Fluoroscopic Guidance: I was personally present in the fluoroscopy suite, where the patient was placed in position for the procedure, over the fluoroscopy-compatible table. Fluoroscopy was manipulated, using "Tunnel Vision Technique", to obtain the best possible view of the target area, on the affected side. Parallax error was corrected before commencing the procedure. A "direction-depth-direction" technique was used to introduce the needle  under continuous pulsed fluoroscopic guidance. Once the target was reached, antero-posterior, oblique, and lateral fluoroscopic projection views were taken to confirm needle placement in all planes. Permanently recorded images stored by scanning into EMR. Interpretation: Intraoperative imaging interpretation by performing Physician. Adequate needle placement confirmed.  Antibiotic Prophylaxis:  Indication(s): No indications identified. Type:  Antibiotics Given (last 72 hours)    None       Post-operative Assessment:   Complications: No immediate post-treatment complications were observed Disposition: Return to clinic for follow-up evaluation. The patient tolerated the entire procedure well. A repeat set of vitals were taken after the procedure and the patient was kept under observation following institutional policy, for this procedure. Post-procedural neurological assessment was performed, showing return to baseline, prior to discharge. The patient was discharged home, once institutional criteria were met. The patient was provided with post-procedure discharge instructions, including a section on how to identify potential problems. Should any problems arise concerning this procedure, the patient was given instructions to immediately contact us, at any time, without hesitation. In any case, we plan to contact the patient by telephone for a follow-up status report regarding this interventional procedure. Comments:  No additional relevant information  Plan of Care   Problem List Items Addressed This Visit      High   Acute postoperative pain   Relevant Medications   oxyCODONE (OXY IR/ROXICODONE) 5 MG immediate release tablet   Chronic low back pain (Location of Primary Source of Pain) (Bilateral) (R>L) (Chronic)   Relevant Medications   fentaNYL (SUBLIMAZE) injection 25-50 mcg   triamcinolone acetonide (KENALOG-40) injection 40 mg (Completed)   oxyCODONE (OXY IR/ROXICODONE) 5 MG immediate  release tablet   Lumbar facet syndrome (Location of Primary Source of Pain) (Bilateral) (R>L) - Primary (Chronic)   Relevant Medications   fentaNYL (SUBLIMAZE) injection 25-50 mcg   lactated ringers infusion 1,000 mL   midazolam (VERSED) 5 MG/5ML injection 1-2 mg   triamcinolone acetonide (KENALOG-40) injection 40 mg (Completed)   lidocaine (PF) (XYLOCAINE) 1 % injection 10  mL (Completed)   ropivacaine (PF) 2 mg/ml (0.2%) (NAROPIN) epidural 9 mL (Completed)   oxyCODONE (OXY IR/ROXICODONE) 5 MG immediate release tablet   Other Relevant Orders   Radiofrequency,Lumbar   Lumbar spondylosis (Chronic)   Relevant Medications   fentaNYL (SUBLIMAZE) injection 25-50 mcg   triamcinolone acetonide (KENALOG-40) injection 40 mg (Completed)   oxyCODONE (OXY IR/ROXICODONE) 5 MG immediate release tablet    Other Visit Diagnoses   None.     Requested PM Follow-up: Return in about 6 weeks (around 07/06/2016) for Post-Procedure evaluation, In addition, Keep prior appointment.  Future Appointments Date Time Provider [place]  06/30/2016 10:00 AM Milinda Pointer, MD [facility] None  08/18/2016 10:45 AM Ashley Morale, MD [facility]    Primary Care Physician: Ashley Morale, MD Location: [facility] Note by: Ashley [name], M.D, DABA, DABAPM, DABPM, DABIPP, FIPP  Disclaimer:  Medicine is not an exact science. The only guarantee in medicine is that nothing is guaranteed. It is important to note that the decision to proceed with this intervention was based on the information collected from the patient. The Data and conclusions were drawn from the patient's questionnaire, the interview, and the physical examination. Because the information was provided in large part by the patient, it cannot be guaranteed that it has not been purposely or unconsciously manipulated. Every effort has been made to obtain as much relevant data as possible for this  evaluation. It is important to note that the conclusions that lead to this procedure are derived in large part from the available data. Always take into account that the treatment will also be dependent on availability of resources and existing treatment guidelines, considered by other Pain Management Practitioners as being common knowledge and practice, at the time of the intervention. For Medico-Legal purposes, it is also important to point out that variation in procedural techniques and pharmacological choices are the acceptable norm. The indications, contraindications, technique, and results of the above procedure should only be interpreted and judged by a Board-Certified Interventional Pain Specialist with extensive familiarity and expertise in the same exact procedure and technique. Attempts at providing opinions without similar or greater experience and expertise than that of the treating physician will be considered as inappropriate and unethical, and shall result in a formal complaint to the state medical board and applicable specialty societies.

## 2016-05-26 ENCOUNTER — Telehealth: Payer: Self-pay | Admitting: *Deleted

## 2016-05-26 NOTE — Telephone Encounter (Signed)
Message left

## 2016-06-10 ENCOUNTER — Ambulatory Visit (INDEPENDENT_AMBULATORY_CARE_PROVIDER_SITE_OTHER): Payer: PPO | Admitting: *Deleted

## 2016-06-10 DIAGNOSIS — D235 Other benign neoplasm of skin of trunk: Secondary | ICD-10-CM | POA: Diagnosis not present

## 2016-06-10 DIAGNOSIS — Z23 Encounter for immunization: Secondary | ICD-10-CM | POA: Diagnosis not present

## 2016-06-10 DIAGNOSIS — L820 Inflamed seborrheic keratosis: Secondary | ICD-10-CM | POA: Diagnosis not present

## 2016-06-10 DIAGNOSIS — I780 Hereditary hemorrhagic telangiectasia: Secondary | ICD-10-CM | POA: Diagnosis not present

## 2016-06-10 DIAGNOSIS — L814 Other melanin hyperpigmentation: Secondary | ICD-10-CM | POA: Diagnosis not present

## 2016-06-10 DIAGNOSIS — L821 Other seborrheic keratosis: Secondary | ICD-10-CM | POA: Diagnosis not present

## 2016-06-10 DIAGNOSIS — D1801 Hemangioma of skin and subcutaneous tissue: Secondary | ICD-10-CM | POA: Diagnosis not present

## 2016-06-23 DIAGNOSIS — Z1231 Encounter for screening mammogram for malignant neoplasm of breast: Secondary | ICD-10-CM | POA: Diagnosis not present

## 2016-06-23 DIAGNOSIS — Z124 Encounter for screening for malignant neoplasm of cervix: Secondary | ICD-10-CM | POA: Diagnosis not present

## 2016-06-23 DIAGNOSIS — Z6833 Body mass index (BMI) 33.0-33.9, adult: Secondary | ICD-10-CM | POA: Diagnosis not present

## 2016-06-30 ENCOUNTER — Ambulatory Visit: Payer: Worker's Compensation | Admitting: Pain Medicine

## 2016-06-30 DIAGNOSIS — K589 Irritable bowel syndrome without diarrhea: Secondary | ICD-10-CM | POA: Diagnosis not present

## 2016-06-30 DIAGNOSIS — K210 Gastro-esophageal reflux disease with esophagitis: Secondary | ICD-10-CM | POA: Diagnosis not present

## 2016-07-07 ENCOUNTER — Ambulatory Visit: Payer: Worker's Compensation | Attending: Pain Medicine | Admitting: Pain Medicine

## 2016-07-07 ENCOUNTER — Encounter: Payer: Self-pay | Admitting: Pain Medicine

## 2016-07-07 VITALS — BP 140/77 | HR 73 | Temp 97.9°F | Resp 16 | Ht 62.0 in | Wt 181.0 lb

## 2016-07-07 DIAGNOSIS — G8918 Other acute postprocedural pain: Secondary | ICD-10-CM | POA: Insufficient documentation

## 2016-07-07 DIAGNOSIS — M5386 Other specified dorsopathies, lumbar region: Secondary | ICD-10-CM | POA: Insufficient documentation

## 2016-07-07 DIAGNOSIS — G894 Chronic pain syndrome: Secondary | ICD-10-CM | POA: Insufficient documentation

## 2016-07-07 DIAGNOSIS — G8929 Other chronic pain: Secondary | ICD-10-CM

## 2016-07-07 DIAGNOSIS — Z5181 Encounter for therapeutic drug level monitoring: Secondary | ICD-10-CM | POA: Insufficient documentation

## 2016-07-07 DIAGNOSIS — M791 Myalgia: Secondary | ICD-10-CM | POA: Insufficient documentation

## 2016-07-07 DIAGNOSIS — F1190 Opioid use, unspecified, uncomplicated: Secondary | ICD-10-CM

## 2016-07-07 DIAGNOSIS — M545 Low back pain: Secondary | ICD-10-CM | POA: Insufficient documentation

## 2016-07-07 DIAGNOSIS — M722 Plantar fascial fibromatosis: Secondary | ICD-10-CM | POA: Insufficient documentation

## 2016-07-07 DIAGNOSIS — M1288 Other specific arthropathies, not elsewhere classified, other specified site: Secondary | ICD-10-CM

## 2016-07-07 DIAGNOSIS — Z79891 Long term (current) use of opiate analgesic: Secondary | ICD-10-CM | POA: Diagnosis not present

## 2016-07-07 DIAGNOSIS — M47816 Spondylosis without myelopathy or radiculopathy, lumbar region: Secondary | ICD-10-CM | POA: Diagnosis not present

## 2016-07-07 DIAGNOSIS — Z79899 Other long term (current) drug therapy: Secondary | ICD-10-CM | POA: Diagnosis not present

## 2016-07-07 NOTE — Progress Notes (Signed)
Patient's Name: Ashley [name]  MRN: [id number]  Referring Provider: Laurey Morale, MD  DOB: [birth year]  PCP: Laurey Morale, MD  DOS: 07/07/2016  Note by: Kathlen Brunswick. Dossie Arbour, MD  Service setting: Ambulatory outpatient  Specialty: Interventional Pain Management  Location: [facility] (AMB) Pain Management Facility    Patient type: Established   Primary Reason(s) for Visit: Encounter for post-procedure evaluation of chronic illness with mild to moderate exacerbation CC: Back Pain (lower)  HPI  Ashley [name] is a 55 y.o. year old, female patient, who comes today for an initial evaluation. She has Genital herpes; Depression; Essential hypertension; Allergic rhinitis; Asthma; GERD; IBS; Overactive bladder; Plantar fasciitis; Myalgia and myositis; HTN (hypertension); Chronic low back pain (Location of Primary Source of Pain) (Bilateral) (R>L); Lumbar facet syndrome (Location of Primary Source of Pain) (Bilateral) (R>L); Chronic pain; Long term current use of opiate analgesic; Long term prescription opiate use; Opiate use (112.5 MME/Day); Encounter for therapeutic drug level monitoring; Pain management; Acute postoperative pain; and Lumbar spondylosis on her problem list.. Her primarily concern today is the Back Pain (lower)  Pain Assessment: Self-Reported Pain Score: 0-No pain/10             Reported level is compatible with observation.       Pain Type: Chronic pain Pain Location: Back Pain Orientation: Right, Left, Lower Pain Descriptors / Indicators: Aching, Dull Pain Frequency: Intermittent  [name] comes in today for post-procedure evaluation after the treatment done on 05/25/2016.  Post-Procedure Assessment  Procedure done on 05/25/2016: Bilateral lumbar facet radiofrequency ablation under fluoroscopic guidance and IV sedation. (Right side done on 02/19/2016 and left side done on 0000000) Complications experienced at the time of the procedure: None Side-effects or Adverse reactions: None  reported Sedation: Sedation provided. When no sedatives are used, the analgesic levels obtained are directly associated with the effectiveness of the local anesthetics. On the other hand, when sedation is provided, the level of analgesia obtained during the initial 1 hour, immediately following the intervention, is believed to be the result of a combination of factors. These factors may include, but are not limited to: 1. The effectiveness of the local anesthetics used. 2. The effects of the analgesic(s) and/or anxiolytic(s) used. 3. The degree of discomfort experienced by the patient at the time of the procedure. 4. The patients ability and reliability in recalling and recording the events. 5. The presence and influence of possible secondary gains. Results: Relief during the 1st hour after the procedure: 100 % (Ultra-Short Term Relief) Interpretative note: Analgesia during this period is likely to be Local Anesthetic and/or IV Sedative (Analgesic/Anxiolitic) related Local Anesthesia: Long-acting (4-6 hours) anesthetics used. The analgesic levels attained during this period are directly associated to the localized infiltration of local anesthetics and therefore cary significant diagnostic value as to the etiological location or origin of the pain. Results: Relief during the next 4 to 6 hour after the procedure: 0 % (Short Term Relief) Interpretative note: Poor patient recollection Long-Term Therapy: Steroids used. Results: Extended relief following procedure: 50 % Interpretative note: Long-term benefit would suggest an inflammatory etiology to the pain.         Long-Term Benefits:  Current Relief (Now): 50%  Interpretative note: Long-term benefit could signal adequate radiofrequency ablation Interpretation of Results: Adequate radiofrequency ablation.          Laboratory Chemistry  Inflammation Markers No results found for: ESRSEDRATE, CRP Renal Function Lab Results  Component Value Date    BUN 12 10/09/2015  CREATININE 0.80 10/09/2015   GFRAA 87 09/05/2008   GFRNONAA 71.44 11/12/2009   Hepatic Function Lab Results  Component Value Date   AST 17 10/09/2015   ALT 30 10/09/2015   ALBUMIN 3.8 10/09/2015   Electrolytes Lab Results  Component Value Date   NA 137 10/09/2015   K 3.8 10/09/2015   CL 104 10/09/2015   CALCIUM 9.1 10/09/2015   Pain Modulating Vitamins No results found for: Maralyn Sago [id number], [id number], [id number], 25OHVITD1, 25OHVITD2, 25OHVITD3, VITAMINB12 Coagulation Parameters Lab Results  Component Value Date   PLT 279.0 10/09/2015   Cardiovascular Lab Results  Component Value Date   HGB 14.7 10/09/2015   HCT 43.9 10/09/2015   Note: Lab results reviewed.  Recent Diagnostic Imaging Review  No results found.  Meds  The patient has a current medication list which includes the following prescription(s): acyclovir, alprazolam, bupropion hcl, calcium-vitamin d, diclofenac sodium, fish oil-omega-3 fatty acids, fluticasone, metoclopramide, multivitamin, omeprazole, orphenadrine, oxybutynin, oxycodone, oxymorphone, potassium chloride sa, and risperidone.  Current Outpatient Prescriptions on File Prior to Visit  Medication Sig  . acyclovir (ZOVIRAX) 400 MG tablet Take 400 mg by mouth daily.    Marland Kitchen alprazolam (XANAX) 2 MG tablet Take 1 tablet (2 mg total) by mouth 3 (three) times daily as needed.  . BuPROPion HCl (WELLBUTRIN PO) Take 150 each by mouth 2 (two) times daily.    . calcium-vitamin D (OSCAL) 250-125 MG-UNIT per tablet Take 1 tablet by mouth daily.    . diclofenac sodium (VOLTAREN) 1 % GEL Apply 1 application topically 4 (four) times daily.  . fish oil-omega-3 fatty acids 1000 MG capsule Take 2 g by mouth daily.    . fluticasone (FLONASE) 50 MCG/ACT nasal spray Place 2 sprays into both nostrils daily.  . metoCLOPramide (REGLAN) 5 MG tablet Take 5 mg by mouth 3 (three) times daily before meals.   . Multiple Vitamin (MULTIVITAMIN) capsule Take 1  capsule by mouth daily.    Marland Kitchen omeprazole (PRILOSEC) 40 MG capsule Take 40 mg by mouth 2 (two) times daily.  . orphenadrine (NORFLEX) 100 MG tablet TAKE 1 TABLET BY MOUTH TWICE A DAY  . oxybutynin (DITROPAN) 5 MG tablet TAKE 1 TABLET BY MOUTH TWICE A DAY  . oxyCODONE (OXY IR/ROXICODONE) 5 MG immediate release tablet Take 1-2 tablets (5-10 mg total) by mouth every 6 (six) hours as needed for severe pain (For post-radiofrequency pain only).  Marland Kitchen oxymorphone (OPANA ER) 5 MG 12 hr tablet Takes 3 tabs two times daily  . potassium chloride SA (KLOR-CON M20) 20 MEQ tablet TAKE 1 TABLET (20 MEQ TOTAL) BY MOUTH 2 (TWO) TIMES DAILY.  Marland Kitchen risperiDONE (RISPERDAL) 2 MG tablet Take 2 mg by mouth daily.     No current facility-administered medications on file prior to visit.    ROS  Constitutional: Denies any fever or chills Gastrointestinal: No reported hemesis, hematochezia, vomiting, or acute GI distress Musculoskeletal: Denies any acute onset joint swelling, redness, loss of ROM, or weakness Neurological: No reported episodes of acute onset apraxia, aphasia, dysarthria, agnosia, amnesia, paralysis, loss of coordination, or loss of consciousness  Allergies  Ms. Drain is allergic to aspirin; ciprofloxacin; gabapentin; and levetiracetam.  PFSH  Drug: Ms. Gerrity  reports that she does not use drugs. Alcohol:  reports that she drinks about 1.2 oz of alcohol per week . Tobacco:  reports that she has never smoked. She has never used smokeless tobacco. Medical:  has a past medical history of Allergy; Asthma; Bipolar disorder ([place]); Chronic lower  back pain; Depression; Fibromyalgia; GERD (gastroesophageal reflux disease); Headache(784.0); HTN (hypertension); IBS (irritable bowel syndrome); Injury of lower back (09-23-05); and Reflex sympathetic dystrophy. Family: family history is not on file.  Past Surgical History:  Procedure Laterality Date  . CHOLECYSTECTOMY    . COLONOSCOPY  Sept. 2016   per Dr. Watt Climes,  clear, repeat in 5 yrs   . ESOPHAGOGASTRODUODENOSCOPY  Sept. 2016   per Dr. Watt Climes, benign gastric polyps  . left shouder     separation  . NASAL SINUS SURGERY    . radiofrequency  ablations april 2010 to lower spine per dr Marjorie Smolder    . removal of Left great toenail Left 01/2016   Constitutional Exam  General appearance: Well nourished, well developed, and well hydrated. In no apparent acute distress Vitals:   07/07/16 1108  BP: 140/77  Pulse: 73  Resp: 16  Temp: 97.9 F (36.6 C)  TempSrc: Oral  SpO2: 100%  Weight: 181 lb (82.1 kg)  Height: 5\' 2"  (1.575 m)   BMI Assessment: Estimated body mass index is 33.11 kg/m as calculated from the following:   Height as of this encounter: 5\' 2"  (1.575 m).   Weight as of this encounter: 181 lb (82.1 kg).  BMI interpretation table: BMI level Category Range association with higher incidence of chronic pain  <18 kg/m2 Underweight   18.5-24.9 kg/m2 Ideal body weight   25-29.9 kg/m2 Overweight Increased incidence by 20%  30-34.9 kg/m2 Obese (Class I) Increased incidence by 68%  35-39.9 kg/m2 Severe obesity (Class II) Increased incidence by 136%  >40 kg/m2 Extreme obesity (Class III) Increased incidence by 254%   BMI Readings from Last 4 Encounters:  07/07/16 33.11 kg/m  05/25/16 34.75 kg/m  05/19/16 35.30 kg/m  04/07/16 33.84 kg/m   Wt Readings from Last 4 Encounters:  07/07/16 181 lb (82.1 kg)  05/25/16 190 lb (86.2 kg)  05/19/16 193 lb (87.5 kg)  04/07/16 185 lb (83.9 kg)  Psych/Mental status: Alert, oriented x 3 (person, place, & time) Eyes: PERLA Respiratory: No evidence of acute respiratory distress  Cervical Spine Exam  Inspection: No masses, redness, or swelling Alignment: Symmetrical Functional ROM: Unrestricted ROM Stability: No instability detected Muscle strength & Tone: Functionally intact Sensory: Unimpaired Palpation: Non-contributory  Upper Extremity (UE) Exam    Side: Right upper extremity  Side: Left  upper extremity  Inspection: No masses, redness, swelling, or asymmetry  Inspection: No masses, redness, swelling, or asymmetry  Functional ROM: Unrestricted ROM         Functional ROM: Unrestricted ROM          Muscle strength & Tone: Functionally intact  Muscle strength & Tone: Functionally intact  Sensory: Unimpaired  Sensory: Unimpaired  Palpation: Non-contributory  Palpation: Non-contributory   Thoracic Spine Exam  Inspection: No masses, redness, or swelling Alignment: Symmetrical Functional ROM: Unrestricted ROM Stability: No instability detected Sensory: Unimpaired Muscle strength & Tone: Functionally intact Palpation: Non-contributory  Lumbar Spine Exam  Inspection: No masses, redness, or swelling Alignment: Symmetrical Functional ROM: Improved after treatment Stability: No instability detected Muscle strength & Tone: Functionally intact Sensory: Unimpaired Palpation: Non-contributory Provocative Tests: Lumbar Hyperextension and rotation test: evaluation deferred today       Patrick's Maneuver: evaluation deferred today              Gait & Posture Assessment  Ambulation: Unassisted Gait: Relatively normal for age and body habitus Posture: WNL   Lower Extremity Exam    Side: Right lower extremity  Side: Left  lower extremity  Inspection: No masses, redness, swelling, or asymmetry  Inspection: No masses, redness, swelling, or asymmetry  Functional ROM: Unrestricted ROM          Functional ROM: Unrestricted ROM          Muscle strength & Tone: Functionally intact  Muscle strength & Tone: Functionally intact  Sensory: Unimpaired  Sensory: Unimpaired  Palpation: Non-contributory  Palpation: Non-contributory   Assessment  Primary Diagnosis & Pertinent Problem List: The primary encounter diagnosis was Chronic pain syndrome. Diagnoses of Chronic low back pain (Location of Primary Source of Pain) (Bilateral) (R>L), Lumbar facet syndrome (Location of Primary Source of Pain)  (Bilateral) (R>L), Lumbar spondylosis, Long term current use of opiate analgesic, and Opiate use (112.5 MME/Day) were also pertinent to this visit.  Visit Diagnosis: 1. Chronic pain syndrome   2. Chronic low back pain (Location of Primary Source of Pain) (Bilateral) (R>L)   3. Lumbar facet syndrome (Location of Primary Source of Pain) (Bilateral) (R>L)   4. Lumbar spondylosis   5. Long term current use of opiate analgesic   6. Opiate use (112.5 MME/Day)    Plan of Care  Pharmacotherapy (Medications Ordered): No orders of the defined types were placed in this encounter.  New Prescriptions   No medications on file   Medications administered during this visit: Ms. Holleran had no medications administered during this visit. Lab-work, Procedure(s), & Referral(s) Ordered: No orders of the defined types were placed in this encounter.  Imaging & Referral(s) Ordered: None  Interventional Therapies: Pending/Scheduled/Planned:   None at this time.    Considering:   Palliative bilateral lumbar facet radiofrequency ablation under fluoroscopic guidance and IV sedation.    PRN Procedures:   None at this time.    Requested PM Follow-up: Return if symptoms worsen or fail to improve, for (PRN) Procedure.  Future Appointments Date Time Provider Francis  08/18/2016 10:45 AM Laurey Morale, MD [facility]   Primary Care Physician: Laurey Morale, MD Location: [facility] Note by: Kathlen Brunswick. Dossie Arbour, M.D, DABA, DABAPM, DABPM, DABIPP, FIPP  Pain Score Disclaimer: We use the NRS-11 scale. This is a self-reported, subjective measurement of pain severity with only modest accuracy. It is used primarily to identify changes within a particular patient. It must be understood that outpatient pain scales are significantly less accurate that those used for research, where they can be applied under ideal controlled circumstances with minimal exposure to  variables. In reality, the score is likely to be a combination of pain intensity and pain affect, where pain affect describes the degree of emotional arousal or changes in action readiness caused by the sensory experience of pain. Factors such as social and work situation, setting, emotional state, anxiety levels, expectation, and prior pain experience may influence pain perception and show large inter-individual differences that may also be affected by time variables.  Patient instructions provided during this appointment: Patient Instructions  Call if you need to return for appointment.

## 2016-07-07 NOTE — Progress Notes (Signed)
Safety precautions to be maintained throughout the outpatient stay will include: orient to surroundings, keep bed in low position, maintain call bell within reach at all times, provide assistance with transfer out of bed and ambulation.  

## 2016-07-07 NOTE — Patient Instructions (Signed)
Call if you need to return for appointment.

## 2016-07-10 DIAGNOSIS — M47816 Spondylosis without myelopathy or radiculopathy, lumbar region: Secondary | ICD-10-CM | POA: Insufficient documentation

## 2016-07-26 ENCOUNTER — Ambulatory Visit (INDEPENDENT_AMBULATORY_CARE_PROVIDER_SITE_OTHER): Payer: PPO | Admitting: Family Medicine

## 2016-07-26 ENCOUNTER — Encounter: Payer: Self-pay | Admitting: Family Medicine

## 2016-07-26 VITALS — BP 128/86 | HR 84 | Temp 98.7°F | Ht 62.0 in | Wt 177.0 lb

## 2016-07-26 DIAGNOSIS — M1288 Other specific arthropathies, not elsewhere classified, other specified site: Secondary | ICD-10-CM | POA: Diagnosis not present

## 2016-07-26 DIAGNOSIS — Z23 Encounter for immunization: Secondary | ICD-10-CM

## 2016-07-26 DIAGNOSIS — M545 Low back pain, unspecified: Secondary | ICD-10-CM

## 2016-07-26 DIAGNOSIS — M47816 Spondylosis without myelopathy or radiculopathy, lumbar region: Secondary | ICD-10-CM

## 2016-07-26 DIAGNOSIS — M797 Fibromyalgia: Secondary | ICD-10-CM

## 2016-07-26 DIAGNOSIS — G8929 Other chronic pain: Secondary | ICD-10-CM

## 2016-07-26 MED ORDER — ORPHENADRINE CITRATE ER 100 MG PO TB12: 100.0000 mg | ORAL_TABLET | Freq: Two times a day (BID) | ORAL | 2 refills | Status: DC

## 2016-07-26 MED ORDER — OXYCODONE HCL ER 20 MG PO T12A: 20.0000 mg | EXTENDED_RELEASE_TABLET | Freq: Two times a day (BID) | ORAL | 0 refills | Status: DC

## 2016-07-26 NOTE — Progress Notes (Signed)
   Subjective:    Patient ID: Marinus Maw, female    DOB: [birth year], 55 y.o.   MRN: [id number]  HPI Here to discuss her medications for chronic pain. She recently had another radiofrequency ablation procedure to the lumbar spine per Dr. Dossie Arbour, and this has been very helpful. She has been taking Opana ER for baseline pain control and using oxycodone for breakthrough pain for several years. She says that her Workers Comp claim will be settled soon, and after that she will rely on her private insurance plan to cover medications. They will not cover Opana ER, and she cannot afford to pay cash for this medication.    Review of Systems  Constitutional: Negative.   Respiratory: Negative.   Cardiovascular: Negative.   Musculoskeletal: Positive for arthralgias and back pain.  Neurological: Negative.        Objective:   Physical Exam  Constitutional: She is oriented to person, place, and time. She appears well-developed and well-nourished.  Cardiovascular: Normal rate, regular rhythm, normal heart sounds and intact distal pulses.   Pulmonary/Chest: Effort normal and breath sounds normal.  Neurological: She is alert and oriented to person, place, and time.          Assessment & Plan:  To control her chronic pain she will continue to use oxycodone for breakthrough pain, but for baseline control she will stop Opana ER and use Oxycontin instead. We will try 20 mg bid and adjust as needed.  Laurey Morale, MD

## 2016-07-26 NOTE — Progress Notes (Signed)
Pre visit review using our clinic review tool, if applicable. No additional management support is needed unless otherwise documented below in the visit note. 

## 2016-07-28 ENCOUNTER — Telehealth: Payer: Self-pay | Admitting: Family Medicine

## 2016-07-28 DIAGNOSIS — N958 Other specified menopausal and perimenopausal disorders: Secondary | ICD-10-CM | POA: Diagnosis not present

## 2016-07-28 DIAGNOSIS — M8588 Other specified disorders of bone density and structure, other site: Secondary | ICD-10-CM | POA: Diagnosis not present

## 2016-07-28 MED ORDER — MORPHINE SULFATE ER 30 MG PO TBCR: 30.0000 mg | EXTENDED_RELEASE_TABLET | Freq: Two times a day (BID) | ORAL | 0 refills | Status: DC

## 2016-07-28 NOTE — Telephone Encounter (Signed)
I wrote to try MS Contin for one month. Have her bring the other rx in so we can destroy it please

## 2016-07-28 NOTE — Telephone Encounter (Signed)
Pt spoke with the pharmacy and they suggested to ask her MD for Rx MS contin which is much much much inexpensive it is 40+ when the Oxycontin is 400+ dollars.  Pt state that she would like to have the suggested Rx.

## 2016-07-30 NOTE — Telephone Encounter (Signed)
Script is ready for pick up here at front office and I left a voice message for pt with below information.

## 2016-08-02 ENCOUNTER — Telehealth: Payer: Self-pay | Admitting: *Deleted

## 2016-08-02 NOTE — Telephone Encounter (Signed)
Dr Naveira notified.  

## 2016-08-02 NOTE — Telephone Encounter (Signed)
Spoke with patient.  States that her PCP will no longer be prescribing her pain medication after the new year.  Patient is currently taking MS Contin and oxycodone.  Patient would like to have Ashley [name] take over if her will.  Patient states that she saw Ashley [name] approx 10 yrs ago.

## 2016-08-18 ENCOUNTER — Other Ambulatory Visit: Payer: Self-pay | Admitting: Family Medicine

## 2016-08-18 ENCOUNTER — Ambulatory Visit: Payer: Worker's Compensation | Admitting: Family Medicine

## 2016-09-27 ENCOUNTER — Telehealth: Payer: Self-pay | Admitting: Family Medicine

## 2016-09-27 DIAGNOSIS — G8918 Other acute postprocedural pain: Secondary | ICD-10-CM

## 2016-09-27 MED ORDER — MORPHINE SULFATE ER 30 MG PO TBCR: 30.0000 mg | EXTENDED_RELEASE_TABLET | Freq: Two times a day (BID) | ORAL | 0 refills | Status: DC

## 2016-09-27 MED ORDER — OXYCODONE HCL 5 MG PO TABS: 5.0000 mg | ORAL_TABLET | Freq: Four times a day (QID) | ORAL | 0 refills | Status: DC | PRN

## 2016-09-27 NOTE — Telephone Encounter (Signed)
done

## 2016-09-27 NOTE — Telephone Encounter (Signed)
° ° ° °  Pt request refill of the following:  oxyCODONE (OXY IR/ROXICODONE) 5 MG immediate release tablet   morphine (MS CONTIN) 30 MG 12 hr tablet   Phamacy:

## 2016-09-27 NOTE — Telephone Encounter (Signed)
Script is ready for pick up here at front office and I spoke with pt.  

## 2016-10-15 ENCOUNTER — Other Ambulatory Visit (INDEPENDENT_AMBULATORY_CARE_PROVIDER_SITE_OTHER): Payer: PPO

## 2016-10-15 DIAGNOSIS — Z0000 Encounter for general adult medical examination without abnormal findings: Secondary | ICD-10-CM | POA: Diagnosis not present

## 2016-10-15 LAB — CBC WITH DIFFERENTIAL/PLATELET
BASOS ABS: 0 10*3/uL (ref 0.0–0.1)
Basophils Relative: 0.3 % (ref 0.0–3.0)
EOS ABS: 0.2 10*3/uL (ref 0.0–0.7)
Eosinophils Relative: 2.5 % (ref 0.0–5.0)
HEMATOCRIT: 41.3 % (ref 36.0–46.0)
Hemoglobin: 14.3 g/dL (ref 12.0–15.0)
LYMPHS PCT: 34.1 % (ref 12.0–46.0)
Lymphs Abs: 2.3 10*3/uL (ref 0.7–4.0)
MCHC: 34.7 g/dL (ref 30.0–36.0)
MCV: 92.1 fl (ref 78.0–100.0)
Monocytes Absolute: 0.6 10*3/uL (ref 0.1–1.0)
Monocytes Relative: 9.7 % (ref 3.0–12.0)
NEUTROS ABS: 3.6 10*3/uL (ref 1.4–7.7)
NEUTROS PCT: 53.4 % (ref 43.0–77.0)
PLATELETS: 231 10*3/uL (ref 150.0–400.0)
RBC: 4.48 Mil/uL (ref 3.87–5.11)
RDW: 13.2 % (ref 11.5–15.5)
WBC: 6.7 10*3/uL (ref 4.0–10.5)

## 2016-10-15 LAB — BASIC METABOLIC PANEL
BUN: 16 mg/dL (ref 6–23)
CALCIUM: 9.2 mg/dL (ref 8.4–10.5)
CO2: 28 meq/L (ref 19–32)
CREATININE: 0.86 mg/dL (ref 0.40–1.20)
Chloride: 106 mEq/L (ref 96–112)
GFR: 73.2 mL/min (ref >60.00)
Glucose, Bld: 93 mg/dL (ref 70–99)
Potassium: 4.4 mEq/L (ref 3.5–5.1)
Sodium: 138 mEq/L (ref 135–145)

## 2016-10-15 LAB — HEPATIC FUNCTION PANEL
ALK PHOS: 71 U/L (ref 39–117)
ALT: 21 U/L (ref 0–35)
AST: 17 U/L (ref 0–37)
Albumin: 3.9 g/dL (ref 3.5–5.2)
BILIRUBIN DIRECT: 0.1 mg/dL (ref 0.0–0.3)
TOTAL PROTEIN: 6.1 g/dL (ref 6.0–8.3)
Total Bilirubin: 0.4 mg/dL (ref 0.2–1.2)

## 2016-10-15 LAB — LIPID PANEL
CHOL/HDL RATIO: 4
Cholesterol: 181 mg/dL (ref 0–200)
HDL: 44.6 mg/dL (ref >39.00)
LDL Cholesterol: 126 mg/dL — ABNORMAL HIGH (ref 0–99)
NonHDL: 136.15
TRIGLYCERIDES: 53 mg/dL (ref 0.0–149.0)
VLDL: 10.6 mg/dL (ref 0.0–40.0)

## 2016-10-15 LAB — TSH: TSH: 1.06 u[IU]/mL (ref 0.35–4.50)

## 2016-10-22 ENCOUNTER — Encounter: Payer: Self-pay | Admitting: Family Medicine

## 2016-10-22 ENCOUNTER — Ambulatory Visit (INDEPENDENT_AMBULATORY_CARE_PROVIDER_SITE_OTHER): Payer: PPO | Admitting: Family Medicine

## 2016-10-22 VITALS — BP 118/88 | HR 102 | Temp 98.2°F | Ht 61.5 in | Wt 168.0 lb

## 2016-10-22 DIAGNOSIS — Z0000 Encounter for general adult medical examination without abnormal findings: Secondary | ICD-10-CM

## 2016-10-22 DIAGNOSIS — R413 Other amnesia: Secondary | ICD-10-CM | POA: Diagnosis not present

## 2016-10-22 LAB — POC URINALSYSI DIPSTICK (AUTOMATED)
Bilirubin, UA: NEGATIVE
Glucose, UA: NEGATIVE
Ketones, UA: NEGATIVE
Leukocytes, UA: NEGATIVE
NITRITE UA: NEGATIVE
PROTEIN UA: NEGATIVE
RBC UA: NEGATIVE
Spec Grav, UA: 1.01
UROBILINOGEN UA: 0.2
pH, UA: 7

## 2016-10-22 MED ORDER — OXYBUTYNIN CHLORIDE 5 MG PO TABS: 5.0000 mg | ORAL_TABLET | Freq: Two times a day (BID) | ORAL | 3 refills | Status: DC

## 2016-10-22 MED ORDER — POTASSIUM CHLORIDE ER 10 MEQ PO TBCR: 10.0000 meq | EXTENDED_RELEASE_TABLET | Freq: Two times a day (BID) | ORAL | 3 refills | Status: DC

## 2016-10-22 NOTE — Progress Notes (Signed)
   Subjective:    Patient ID: Ashley [name], female    DOB: 12-25-1960, 55 y.o.   MRN: [id number]  HPI 56 yr old female for a well exam. She is doing well. Her pain medications work quite well. She does ask about memory loss however. Both she and her husband have noticed a decline in her memory over the past year. She spoke to her Psychiatrist about this and they suggested se ask [place].    Review of Systems  Constitutional: Negative.   HENT: Negative.   Eyes: Negative.   Respiratory: Negative.   Cardiovascular: Negative.   Gastrointestinal: Negative.   Genitourinary: Negative for decreased urine volume, difficulty urinating, dyspareunia, dysuria, enuresis, flank pain, frequency, hematuria, pelvic pain and urgency.  Musculoskeletal: Positive for arthralgias and back pain.  Skin: Negative.   Neurological: Negative.   Psychiatric/Behavioral: Negative.        Objective:   Physical Exam  Constitutional: She is oriented to person, place, and time. She appears well-developed and well-nourished. No distress.  HENT:  Head: Normocephalic and atraumatic.  Right Ear: External ear normal.  Left Ear: External ear normal.  Nose: Nose normal.  Mouth/Throat: Oropharynx is clear and moist. No oropharyngeal exudate.  Eyes: Conjunctivae and EOM are normal. Pupils are equal, round, and reactive to light. No scleral icterus.  Neck: Normal range of motion. Neck supple. No JVD present. No thyromegaly present.  Cardiovascular: Normal rate, regular rhythm, normal heart sounds and intact distal pulses.  Exam reveals no gallop and no friction rub.   No murmur heard. Pulmonary/Chest: Effort normal and breath sounds normal. No respiratory distress. She has no wheezes. She has no rales. She exhibits no tenderness.  Abdominal: Soft. Bowel sounds are normal. She exhibits no distension and no mass. There is no tenderness. There is no rebound and no guarding.  Musculoskeletal: Normal range of motion. She exhibits  no edema or tenderness.  Lymphadenopathy:    She has no cervical adenopathy.  Neurological: She is alert and oriented to person, place, and time. She has normal reflexes. No cranial nerve deficit. She exhibits normal muscle tone. Coordination normal.  Skin: Skin is warm and dry. No rash noted. No erythema.  Psychiatric: She has a normal mood and affect. Her behavior is normal. Judgment and thought content normal.          Assessment & Plan:  Well exam. We discused diet and exercise. Refer to Neurology for the memory issues.  [name], MD

## 2016-10-25 ENCOUNTER — Telehealth: Payer: Self-pay | Admitting: Pain Medicine

## 2016-10-25 NOTE — Telephone Encounter (Signed)
Patient returned our call, I informed her that she will need to make an initial appointment for eval, labs, possible med/psych eval before Dr. Dossie Arbour would prescribe. Patient will call when ready.

## 2016-10-25 NOTE — Telephone Encounter (Signed)
Attempted to call, message left. 

## 2016-10-25 NOTE — Telephone Encounter (Signed)
Wants to know if when pcp can no longer write scripts will Dr. Dossie Arbour write her pain meds

## 2016-11-05 ENCOUNTER — Encounter: Payer: Self-pay | Admitting: Family Medicine

## 2016-11-05 ENCOUNTER — Ambulatory Visit (INDEPENDENT_AMBULATORY_CARE_PROVIDER_SITE_OTHER): Payer: Self-pay | Admitting: Family Medicine

## 2016-11-05 VITALS — BP 122/96 | HR 79 | Temp 98.3°F | Ht 61.5 in | Wt 168.0 lb

## 2016-11-05 DIAGNOSIS — G8929 Other chronic pain: Secondary | ICD-10-CM

## 2016-11-05 DIAGNOSIS — M797 Fibromyalgia: Secondary | ICD-10-CM

## 2016-11-05 DIAGNOSIS — R52 Pain, unspecified: Secondary | ICD-10-CM

## 2016-11-05 DIAGNOSIS — G8918 Other acute postprocedural pain: Secondary | ICD-10-CM

## 2016-11-05 DIAGNOSIS — M545 Low back pain: Secondary | ICD-10-CM

## 2016-11-05 MED ORDER — MORPHINE SULFATE ER 30 MG PO TBCR: 30.0000 mg | EXTENDED_RELEASE_TABLET | Freq: Two times a day (BID) | ORAL | 0 refills | Status: DC

## 2016-11-05 MED ORDER — OXYCODONE HCL 5 MG PO TABS: 5.0000 mg | ORAL_TABLET | Freq: Four times a day (QID) | ORAL | 0 refills | Status: DC | PRN

## 2016-11-05 MED ORDER — DICLOFENAC SODIUM 1 % TD GEL: 1.0000 "application " | Freq: Four times a day (QID) | TRANSDERMAL | 11 refills | Status: DC

## 2016-11-05 NOTE — Progress Notes (Signed)
   Subjective:    Patient ID: Ashley [name], female    DOB: 1961/09/12, 56 y.o.   MRN: [id number]  HPI Here for med refills. She is doing well. Her back pain is stable.    Review of Systems  Constitutional: Negative.   Respiratory: Negative.   Cardiovascular: Negative.   Musculoskeletal: Positive for back pain and myalgias.       Objective:   Physical Exam  Constitutional: She appears well-developed and well-nourished.  Cardiovascular: Normal rate, regular rhythm, normal heart sounds and intact distal pulses.   Pulmonary/Chest: Effort normal and breath sounds normal.          Assessment & Plan:  Her pain syndromes are stable. meds were refilled.  [name], MD

## 2016-11-05 NOTE — Progress Notes (Signed)
Pre visit review using our clinic review tool, if applicable. No additional management support is needed unless otherwise documented below in the visit note. 

## 2016-11-12 ENCOUNTER — Ambulatory Visit: Payer: PPO | Admitting: Podiatry

## 2016-11-15 ENCOUNTER — Ambulatory Visit (INDEPENDENT_AMBULATORY_CARE_PROVIDER_SITE_OTHER): Payer: PPO | Admitting: Podiatry

## 2016-11-15 ENCOUNTER — Encounter: Payer: Self-pay | Admitting: Podiatry

## 2016-11-15 DIAGNOSIS — L600 Ingrowing nail: Secondary | ICD-10-CM

## 2016-11-15 NOTE — Patient Instructions (Signed)
Soak Instructions    THE DAY AFTER THE PROCEDURE  Place 1/4 cup of epsom salts in a quart of warm tap water.  Submerge your foot or feet with outer bandage intact for the initial soak; this will allow the bandage to become moist and wet for easy lift off.  Once you remove your bandage, continue to soak in the solution for 20 minutes.  This soak should be done twice a day.  Next, remove your foot or feet from solution, blot dry the affected area and cover.  You may use a band aid large enough to cover the area or use gauze and tape.  Apply other medications to the area as directed by the doctor such as polysporin neosporin.  IF YOUR SKIN BECOMES IRRITATED WHILE USING THESE INSTRUCTIONS, IT IS OKAY TO SWITCH TO  WHITE VINEGAR AND WATER. Or you may use antibacterial soap and water to keep the toe clean  Monitor for any signs/symptoms of infection. Call the office immediately if any occur or go directly to the emergency room. Call with any questions/concerns.    Long Term Care Instructions-Post Nail Surgery  You have had your ingrown toenail and root treated with a chemical.  This chemical causes a burn that will drain and ooze like a blister.  This can drain for 6-8 weeks or longer.  It is important to keep this area clean, covered, and follow the soaking instructions dispensed at the time of your surgery.  This area will eventually dry and form a scab.  Once the scab forms you no longer need to soak or apply a dressing.  If at any time you experience an increase in pain, redness, swelling, or drainage, you should contact the office as soon as possible. Long Term Care Instructions-Post Nail Surgery  You have had your ingrown toenail and root treated with a chemical.  This chemical causes a burn that will drain and ooze like a blister.  This can drain for 6-8 weeks or longer.  It is important to keep this area clean, covered, and follow the soaking instructions dispensed at the time of your surgery.   This area will eventually dry and form a scab.  Once the scab forms you no longer need to soak or apply a dressing.  If at any time you experience an increase in pain, redness, swelling, or drainage, you should contact the office as soon as possible. 

## 2016-11-17 ENCOUNTER — Ambulatory Visit (INDEPENDENT_AMBULATORY_CARE_PROVIDER_SITE_OTHER): Payer: PPO | Admitting: Diagnostic Neuroimaging

## 2016-11-17 ENCOUNTER — Other Ambulatory Visit (INDEPENDENT_AMBULATORY_CARE_PROVIDER_SITE_OTHER): Payer: Self-pay

## 2016-11-17 ENCOUNTER — Encounter: Payer: Self-pay | Admitting: Diagnostic Neuroimaging

## 2016-11-17 VITALS — BP 126/77 | HR 72 | Ht 62.0 in | Wt 168.0 lb

## 2016-11-17 DIAGNOSIS — R413 Other amnesia: Secondary | ICD-10-CM

## 2016-11-17 DIAGNOSIS — M545 Low back pain, unspecified: Secondary | ICD-10-CM

## 2016-11-17 DIAGNOSIS — Z79891 Long term (current) use of opiate analgesic: Secondary | ICD-10-CM

## 2016-11-17 DIAGNOSIS — G8929 Other chronic pain: Secondary | ICD-10-CM

## 2016-11-17 DIAGNOSIS — M797 Fibromyalgia: Secondary | ICD-10-CM | POA: Diagnosis not present

## 2016-11-17 DIAGNOSIS — Z0289 Encounter for other administrative examinations: Secondary | ICD-10-CM

## 2016-11-17 NOTE — Progress Notes (Signed)
Subjective:     Patient ID: Ashley [name], female   DOB: 1961-02-22, 56 y.o.   MRN: [id number]  HPI patient states that her right big toenail lateral borders ingrown and her second nail has become very thick and she cannot cut it   Review of Systems     Objective:   Physical Exam Neurovascular status intact muscle strength was adequate with patient's right hallux lateral border being incurvated and painful when palpated. The right second nail is thickened and dystrophic and she has trouble cutting it and at times he gets tender    Assessment:     Ingrown toenail deformity right hallux with thickened damaged second nail right    Plan:     H&P conditions reviewed and discussed procedures that are available. Patient's opted for surgical correction like her left foot and at this point I allowed her to discuss with me concerning risk. Patient wants procedure understanding risk and today I infiltrated the right hallux and second toes with 60 mg like Marcaine mixture remove the hallux nail right that lateral border second digit right and exposed matrix applying phenol for applications 30 seconds followed by alcohol lavage and sterile dressing. Gave instructions on soaks and reappoint

## 2016-11-17 NOTE — Progress Notes (Signed)
[facility]  PATIENT: Ashley [name] DOB: 1961/05/10  REFERRING CLINICIAN: Barbie Banner HISTORY FROM: patient  REASON FOR VISIT: new consult    HISTORICAL  CHIEF COMPLAINT:  Chief Complaint  Patient presents with  . Memory Loss    rm 6, New Pt, MMSE  26    HISTORY OF PRESENT ILLNESS:   56 year old right-handed female here for evaluation of memory loss. Patient has history of chronic low back pain, IBS, bipolar disorder. Patient reports of these one year of increasing short-term memory loss, word finding difficulties, short-term recall problems. This has been noted mainly by patient and her husband. Apparently her husband is getting frustrated by the patient's short-term memory loss and advised her to get some evaluation.  Patient has had chronic pain since 2007 is on long-term chronic narcotic medications.  Patient also has bipolar disorder, manic type, under treatment by Dr. Ruthann Cancer psychiatry.  Patient has family history of also is dementia in several grandparents.  Patient drinks 1 or 2 alcoholic drinks 4 days per week.  Patient has some snoring, history of subjective sleep apnea on CPAP in 2003 but not currently.    REVIEW OF SYSTEMS: Full 14 system review of systems performed and negative with exception of: Memory loss confusion snoring urination problems anxiety moles.  ALLERGIES: Allergies  Allergen Reactions  . Aspirin Anaphylaxis  . Ciprofloxacin Nausea And Vomiting  . Gabapentin Other (See Comments)    Sleep apnea  . Levetiracetam Other (See Comments)    Universal body pain    HOME MEDICATIONS: Outpatient Medications Prior to Visit  Medication Sig Dispense Refill  . alprazolam (XANAX) 2 MG tablet Take 1 tablet (2 mg total) by mouth 3 (three) times daily as needed. 90 tablet 5  . BuPROPion HCl (WELLBUTRIN PO) Take 150 each by mouth 2 (two) times daily.      . calcium-vitamin D (OSCAL) 250-125 MG-UNIT per tablet Take 1 tablet by mouth  daily.      . diclofenac sodium (VOLTAREN) 1 % GEL Apply 1 application topically 4 (four) times daily. 100 g 11  . estradiol (CLIMARA - DOSED IN MG/24 HR) 0.025 mg/24hr patch Place 0.025 mg onto the skin once a week.    . fish oil-omega-3 fatty acids 1000 MG capsule Take 2 g by mouth daily.      . metoCLOPramide (REGLAN) 5 MG tablet Take 5 mg by mouth daily as needed.     Marland Kitchen morphine (MS CONTIN) 30 MG 12 hr tablet Take 1 tablet (30 mg total) by mouth every 12 (twelve) hours. 60 tablet 0  . Multiple Vitamin (MULTIVITAMIN) capsule Take 1 capsule by mouth daily.      Marland Kitchen omeprazole (PRILOSEC) 40 MG capsule Take 40 mg by mouth 2 (two) times daily.  1  . orphenadrine (NORFLEX) 100 MG tablet Take 1 tablet (100 mg total) by mouth 2 (two) times daily. 180 tablet 2  . oxybutynin (DITROPAN) 5 MG tablet Take 1 tablet (5 mg total) by mouth 2 (two) times daily. 180 tablet 3  . oxyCODONE (OXY IR/ROXICODONE) 5 MG immediate release tablet Take 1-2 tablets (5-10 mg total) by mouth every 6 (six) hours as needed for severe pain (For post-radiofrequency pain only). 200 tablet 0  . potassium chloride (K-DUR) 10 MEQ tablet Take 1 tablet (10 mEq total) by mouth 2 (two) times daily. 180 tablet 3  . risperiDONE (RISPERDAL) 2 MG tablet Take 2 mg by mouth daily.      . traZODone (DESYREL) 50  MG tablet Take 75 mg by mouth at bedtime.    Marland Kitchen acyclovir (ZOVIRAX) 400 MG tablet Take 400 mg by mouth daily.      . fluticasone (FLONASE) 50 MCG/ACT nasal spray Place 2 sprays into both nostrils daily. 16 g 6   No facility-administered medications prior to visit.     PAST MEDICAL HISTORY: Past Medical History:  Diagnosis Date  . Allergy   . Asthma   . Bipolar disorder ([place])    dr Shirlee More   . Chronic lower back pain    sees Dr. Milinda Pointer at [facility]   . Depression   . Fibromyalgia    dr Milinda Pointer [place]  . GERD (gastroesophageal reflux disease)   . Headache(784.0)   . HTN  (hypertension)   . IBS (irritable bowel syndrome)    dr Watt Climes  . Injury of lower back 09-23-05   Workers Comp case worker is Michelene Gardener (phone [phone] or fax [phone])  . Reflex sympathetic dystrophy    left hand dr Annie Main and dr Nelva Bush    PAST SURGICAL HISTORY: Past Surgical History:  Procedure Laterality Date  . APPENDECTOMY    . CARPAL TUNNEL RELEASE Bilateral   . CERVICAL SPINE SURGERY    . CHOLECYSTECTOMY    . COLONOSCOPY  Sept. 2016   per Dr. Watt Climes, clear, repeat in 5 yrs   . ESOPHAGOGASTRODUODENOSCOPY  Sept. 2016   per Dr. Watt Climes, benign gastric polyps  . left shouder  1996   separation, x 2  . NASAL SINUS SURGERY    . radiofrequency  ablations april 2010 to lower spine per dr Marjorie Smolder    . removal of Left great toenail Left 01/2016  . TRIGGER FINGER RELEASE Right     FAMILY HISTORY: Family History  Problem Relation Age of Onset  . Hypertension Mother   . Cancer Father     lung  . Cancer Maternal Aunt   . Dementia Maternal Grandmother   . Stroke Maternal Grandmother   . Dementia Maternal Grandfather   . Heart attack Maternal Grandfather   . Alzheimer's disease Paternal Grandmother   . Heart attack Paternal Grandfather     SOCIAL HISTORY:  Social History   Social History  . Marital status: Married    Spouse name: Gerald Stabs  . Number of children: 0  . Years of education: 50   Occupational History  .      [employer]   Social History Main Topics  . Smoking status: Never Smoker  . Smokeless tobacco: Never Used  . Alcohol use 1.2 oz/week    2 Standard drinks or equivalent per week     Comment: occ, 1-2 4 days/week  . Drug use: No  . Sexual activity: Not on file   Other Topics Concern  . Not on file   Social History Narrative   Lives with husband   Caffeine- sodas, 1-2 daily     PHYSICAL EXAM  GENERAL EXAM/CONSTITUTIONAL: Vitals:  Vitals:   11/17/16 1120  BP: 126/77  Pulse: 72  Weight: 168 lb (76.2 kg)  Height:  5\' 2"  (1.575 m)     Body mass index is 30.73 kg/m.  Visual Acuity Screening   Right eye Left eye Both eyes  Without correction:     With correction: 20/50 20/50      Patient is in no distress; well developed, nourished and groomed; neck is supple  CARDIOVASCULAR:  Examination of carotid arteries is normal; no  carotid bruits  Regular rate and rhythm, no murmurs  Examination of peripheral vascular system by observation and palpation is normal  EYES:  Ophthalmoscopic exam of optic discs and posterior segments is normal; no papilledema or hemorrhages  MUSCULOSKELETAL:  Gait, strength, tone, movements noted in Neurologic exam below  NEUROLOGIC: MENTAL STATUS:  MMSE - Mini Mental State Exam 11/17/2016  Orientation to time 5  Orientation to Place 5  Registration 3  Attention/ Calculation 2  Recall 2  Language- name 2 objects 2  Language- repeat 1  Language- follow 3 step command 3  Language- read & follow direction 1  Write a sentence 1  Copy design 1  Total score 26    awake, alert, oriented to person, place and time  recent and remote memory intact  normal attention and concentration  language fluent, comprehension intact, naming intact,   fund of knowledge appropriate  CRANIAL NERVE:   2nd - no papilledema on fundoscopic exam  2nd, 3rd, 4th, 6th - pupils equal and reactive to light, visual fields full to confrontation, extraocular muscles intact, no nystagmus  5th - facial sensation symmetric  7th - facial strength symmetric  8th - hearing intact  9th - palate elevates symmetrically, uvula midline  11th - shoulder shrug symmetric  12th - tongue protrusion midline  MOTOR:   normal bulk and tone, full strength in the BUE, BLE  SENSORY:   normal and symmetric to light touch, temperature, vibration  COORDINATION:   finger-nose-finger, fine finger movements normal  REFLEXES:   deep tendon reflexes TRACE and symmetric  GAIT/STATION:    narrow based gait; romberg is negative    DIAGNOSTIC DATA (LABS, IMAGING, TESTING) - I reviewed patient records, labs, notes, testing and imaging myself where available.  Lab Results  Component Value Date   WBC 6.7 10/15/2016   HGB 14.3 10/15/2016   HCT 41.3 10/15/2016   MCV 92.1 10/15/2016   PLT 231.0 10/15/2016      Component Value Date/Time   NA 138 10/15/2016 1104   K 4.4 10/15/2016 1104   CL 106 10/15/2016 1104   CO2 28 10/15/2016 1104   GLUCOSE 93 10/15/2016 1104   GLUCOSE 87 07/11/2006 1515   BUN 16 10/15/2016 1104   CREATININE 0.86 10/15/2016 1104   CALCIUM 9.2 10/15/2016 1104   PROT 6.1 10/15/2016 1104   ALBUMIN 3.9 10/15/2016 1104   AST 17 10/15/2016 1104   ALT 21 10/15/2016 1104   ALKPHOS 71 10/15/2016 1104   BILITOT 0.4 10/15/2016 1104   GFRNONAA 71.44 11/12/2009 1058   GFRAA 87 09/05/2008 1002   Lab Results  Component Value Date   CHOL 181 10/15/2016   HDL 44.60 10/15/2016   LDLCALC 126 (H) 10/15/2016   LDLDIRECT 117.0 01/14/2016   TRIG 53.0 10/15/2016   CHOLHDL 4 10/15/2016   No results found for: HGBA1C No results found for: VITAMINB12 Lab Results  Component Value Date   TSH 1.06 10/15/2016        ASSESSMENT AND PLAN  56 y.o. year old female here with memory loss in setting of bipolar disorder, chronic pain, sleep apnea. Will check additional workup to rule out other secondary causes.   Ddx: memory loss due to (bipolar disorder, bipolar medications, chronic pain, pain medications, sleep disorder, sleep aid, sleep apnea) vs other secondary causes  1. Memory loss   2. Chronic low back pain (Location of Primary Source of Pain) (Bilateral) (R>L)   3. Long term prescription opiate use   4.  Fibromyalgia      PLAN: - check MRI brain (rule out other causes of memory loss: stroke, inflammation, demyelination) - check sleep study (remote history of sleep apnea; not currently on treatment) - check B12 level - brain health activities  reviewed  Orders Placed This Encounter  Procedures  . MR BRAIN WO CONTRAST  . Vitamin B12  . Ambulatory referral to Sleep Studies   Return in about 3 months (around 02/14/2017).    Penni Bombard, MD [id number], [id number] AM Certified in Neurology, Neurophysiology and Neuroimaging  [facility] [address], [place], [place] [address] [phone]

## 2016-11-17 NOTE — Patient Instructions (Signed)
Thank you for coming to see [place] at [facility]. I hope we have been able to provide you high quality care today.  You may receive a patient satisfaction survey over the next few weeks. We would appreciate your feedback and comments so that we may continue to improve ourselves and the health of our patients.  - check MRI brain  - check sleep study   - check B12 level  - try to improve brain health activities, especially exercise   ~~~~~~~~~~~~~~~~~~~~~~~~~~~~~~~~~~~~~~~~~~~~~~~~~~~~~~~~~~~~~~~~~  DR. Dent Plantz'S GUIDE TO HAPPY AND HEALTHY LIVING These are some of my general health and wellness recommendations. Some of them may apply to you better than others. Please use common sense as you try these suggestions and feel free to ask me any questions.   ACTIVITY/FITNESS Mental, social, emotional and physical stimulation are very important for brain and body health. Try learning a new activity (arts, music, language, sports, games).  Keep moving your body to the best of your abilities. You can do this at home, inside or outside, the park, community center, gym or anywhere you like. Consider a physical therapist or personal trainer to get started. Consider the app Sworkit. Fitness trackers such as smart-watches, smart-phones or Fitbits can help as well.   NUTRITION Eat more plants: colorful vegetables, nuts, seeds and berries.  Eat less sugar, salt, preservatives and processed foods.  Avoid toxins such as cigarettes and alcohol.  Drink water when you are thirsty. Warm water with a slice of lemon is an excellent morning drink to start the day.  Consider these websites for more information The Nutrition Source (https://www.henry-hernandez.biz/) Precision Nutrition (WindowBlog.ch)   RELAXATION Consider practicing mindfulness meditation or other relaxation techniques such as deep breathing, prayer, yoga, tai chi, massage. See  website mindful.org or the apps Headspace or Calm to help get started.   SLEEP Try to get at least 7-8+ hours sleep per day. Regular exercise and reduced caffeine will help you sleep better. Practice good sleep hygeine techniques. See website sleep.org for more information.   PLANNING Prepare estate planning, living will, healthcare POA documents. Sometimes this is best planned with the help of an attorney. Theconversationproject.org and agingwithdignity.org are excellent resources.

## 2016-11-18 ENCOUNTER — Telehealth: Payer: Self-pay | Admitting: *Deleted

## 2016-11-18 LAB — VITAMIN B12: Vitamin B-12: 787 pg/mL (ref 232–1245)

## 2016-11-18 NOTE — Telephone Encounter (Signed)
LVM informing patient her lab, Vit B12 level is good. Left number for any questions.

## 2016-12-02 ENCOUNTER — Institutional Professional Consult (permissible substitution): Payer: PPO | Admitting: Neurology

## 2017-02-02 ENCOUNTER — Encounter: Payer: Self-pay | Admitting: Family Medicine

## 2017-02-02 ENCOUNTER — Ambulatory Visit (INDEPENDENT_AMBULATORY_CARE_PROVIDER_SITE_OTHER): Payer: Self-pay | Admitting: Family Medicine

## 2017-02-02 VITALS — BP 101/81 | HR 71 | Temp 98.4°F | Ht 62.0 in | Wt 160.0 lb

## 2017-02-02 DIAGNOSIS — I10 Essential (primary) hypertension: Secondary | ICD-10-CM

## 2017-02-02 DIAGNOSIS — M797 Fibromyalgia: Secondary | ICD-10-CM

## 2017-02-02 DIAGNOSIS — G8929 Other chronic pain: Secondary | ICD-10-CM

## 2017-02-02 DIAGNOSIS — M545 Low back pain: Secondary | ICD-10-CM

## 2017-02-02 MED ORDER — MORPHINE SULFATE ER 30 MG PO TBCR: 30.0000 mg | EXTENDED_RELEASE_TABLET | Freq: Two times a day (BID) | ORAL | 0 refills | Status: DC

## 2017-02-02 MED ORDER — ORPHENADRINE CITRATE ER 100 MG PO TB12: 100.0000 mg | ORAL_TABLET | Freq: Two times a day (BID) | ORAL | 3 refills | Status: DC

## 2017-02-02 NOTE — Progress Notes (Signed)
   Subjective:    Patient ID: Ashley [name], female    DOB: [birth year], 56 y.o.   MRN: [id number]  HPI Here for follow up. She is doing well. Her pain control is adequate. She is still with [employer] and she has lost about 35 lbs so far.    Review of Systems  Constitutional: Negative.   Respiratory: Negative.   Cardiovascular: Negative.   Musculoskeletal: Positive for back pain.  Neurological: Negative.        Objective:   Physical Exam  Constitutional: She is oriented to person, place, and time. She appears well-developed and well-nourished.  Cardiovascular: Normal rate, regular rhythm, normal heart sounds and intact distal pulses.   Pulmonary/Chest: Effort normal and breath sounds normal. No respiratory distress. She has no wheezes. She has no rales.  Neurological: She is alert and oriented to person, place, and time.          Assessment & Plan:  Her pain is stable. meds were refilled. Her HTN is stable.  [name], MD

## 2017-02-02 NOTE — Patient Instructions (Signed)
WE NOW OFFER   [facility] FAST TRACK!!!  SAME DAY Appointments for ACUTE CARE  Such as: Sprains, Injuries, cuts, abrasions, rashes, muscle pain, joint pain, back pain Colds, flu, sore throats, headache, allergies, cough, fever  Ear pain, sinus and eye infections Abdominal pain, nausea, vomiting, diarrhea, upset stomach Animal/insect bites  3 Easy Ways to Schedule: Walk-In Scheduling Call in scheduling Mychart Sign-up: [email or web address]         

## 2017-02-08 ENCOUNTER — Telehealth: Payer: Self-pay | Admitting: Family Medicine

## 2017-02-08 NOTE — Telephone Encounter (Signed)
Pt is calling stating that she needs a letter stating how much weight Dr. Sarajane Jews would like for her to get down to or be at.  Pt state that [employer] would like for her to lose 20 lbs and her current weight it 157 and they would like for her to get down to 137.

## 2017-02-10 NOTE — Telephone Encounter (Signed)
I wrote a letter stating that I would like her target weight to be 150 lbs.

## 2017-02-10 NOTE — Telephone Encounter (Signed)
I spoke with pt and put this in the mail per pt request.

## 2017-02-23 ENCOUNTER — Ambulatory Visit: Payer: PPO | Admitting: Diagnostic Neuroimaging

## 2017-02-27 ENCOUNTER — Other Ambulatory Visit: Payer: Self-pay | Admitting: Family Medicine

## 2017-03-01 ENCOUNTER — Other Ambulatory Visit: Payer: Self-pay | Admitting: Diagnostic Neuroimaging

## 2017-03-07 ENCOUNTER — Other Ambulatory Visit: Payer: Self-pay

## 2017-03-08 DIAGNOSIS — H5213 Myopia, bilateral: Secondary | ICD-10-CM | POA: Diagnosis not present

## 2017-03-08 DIAGNOSIS — H524 Presbyopia: Secondary | ICD-10-CM | POA: Diagnosis not present

## 2017-03-08 DIAGNOSIS — H04123 Dry eye syndrome of bilateral lacrimal glands: Secondary | ICD-10-CM | POA: Diagnosis not present

## 2017-03-08 DIAGNOSIS — H16223 Keratoconjunctivitis sicca, not specified as Sjogren's, bilateral: Secondary | ICD-10-CM | POA: Diagnosis not present

## 2017-04-13 ENCOUNTER — Telehealth: Payer: Self-pay | Admitting: Family Medicine

## 2017-04-13 NOTE — Telephone Encounter (Signed)
I spoke with pt and put in the mail, per request.

## 2017-04-13 NOTE — Telephone Encounter (Signed)
The letter is ready to pick up  °

## 2017-04-13 NOTE — Telephone Encounter (Signed)
° ° °  Pt call to say she received a jury summon and would like for Dr Sarajane Jews to write her a not saying she can not serve because of her back    [phone]

## 2017-05-04 ENCOUNTER — Encounter: Payer: Self-pay | Admitting: Family Medicine

## 2017-05-04 ENCOUNTER — Ambulatory Visit (INDEPENDENT_AMBULATORY_CARE_PROVIDER_SITE_OTHER): Payer: Self-pay | Admitting: Family Medicine

## 2017-05-04 VITALS — BP 104/72 | Temp 98.5°F | Ht 62.0 in | Wt 160.0 lb

## 2017-05-04 DIAGNOSIS — I10 Essential (primary) hypertension: Secondary | ICD-10-CM

## 2017-05-04 DIAGNOSIS — G8918 Other acute postprocedural pain: Secondary | ICD-10-CM

## 2017-05-04 DIAGNOSIS — G8929 Other chronic pain: Secondary | ICD-10-CM

## 2017-05-04 DIAGNOSIS — M7062 Trochanteric bursitis, left hip: Secondary | ICD-10-CM

## 2017-05-04 DIAGNOSIS — M797 Fibromyalgia: Secondary | ICD-10-CM

## 2017-05-04 DIAGNOSIS — M545 Low back pain: Secondary | ICD-10-CM

## 2017-05-04 MED ORDER — MORPHINE SULFATE ER 30 MG PO TBCR: 30.0000 mg | EXTENDED_RELEASE_TABLET | Freq: Two times a day (BID) | ORAL | 0 refills | Status: DC

## 2017-05-04 MED ORDER — OXYCODONE HCL 5 MG PO TABS
5.0000 mg | ORAL_TABLET | Freq: Four times a day (QID) | ORAL | 0 refills | Status: DC | PRN
Start: 2017-05-04 — End: 2017-11-02

## 2017-05-04 NOTE — Patient Instructions (Signed)
WE NOW OFFER   [facility] FAST TRACK!!!  SAME DAY Appointments for ACUTE CARE  Such as: Sprains, Injuries, cuts, abrasions, rashes, muscle pain, joint pain, back pain Colds, flu, sore throats, headache, allergies, cough, fever  Ear pain, sinus and eye infections Abdominal pain, nausea, vomiting, diarrhea, upset stomach Animal/insect bites  3 Easy Ways to Schedule: Walk-In Scheduling Call in scheduling Mychart Sign-up: [email or web address]         

## 2017-05-04 NOTE — Progress Notes (Signed)
   Subjective:    Patient ID: Ashley [name], female    DOB: 05-23-1961, 56 y.o.   MRN: [id number]  HPI Here to follow up her chronic pain syndromes. She is doing well except for a few days of a new sharp pain in the left hip area. No recent trauma.    Review of Systems  Constitutional: Negative.   Respiratory: Negative.   Cardiovascular: Negative.   Musculoskeletal: Positive for back pain.       Objective:   Physical Exam  Constitutional: She appears well-developed and well-nourished.  Cardiovascular: Normal rate, regular rhythm, normal heart sounds and intact distal pulses.   Pulmonary/Chest: Effort normal and breath sounds normal. No respiratory distress. She has no wheezes. She has no rales.  Musculoskeletal:  Tender over the left greater trochanter           Assessment & Plan:  Her chronic low back pain is stable, and her meds were refilled. She also has some trochanteric bursitis, and she can use moist heat and Ibuprofen for this.  [name], MD

## 2017-06-06 ENCOUNTER — Ambulatory Visit: Payer: PPO | Admitting: Diagnostic Neuroimaging

## 2017-07-06 DIAGNOSIS — Z1231 Encounter for screening mammogram for malignant neoplasm of breast: Secondary | ICD-10-CM | POA: Diagnosis not present

## 2017-07-06 DIAGNOSIS — Z124 Encounter for screening for malignant neoplasm of cervix: Secondary | ICD-10-CM | POA: Diagnosis not present

## 2017-07-06 DIAGNOSIS — Z6829 Body mass index (BMI) 29.0-29.9, adult: Secondary | ICD-10-CM | POA: Diagnosis not present

## 2017-07-06 DIAGNOSIS — Z01419 Encounter for gynecological examination (general) (routine) without abnormal findings: Secondary | ICD-10-CM | POA: Diagnosis not present

## 2017-07-06 DIAGNOSIS — B373 Candidiasis of vulva and vagina: Secondary | ICD-10-CM | POA: Diagnosis not present

## 2017-07-06 DIAGNOSIS — N952 Postmenopausal atrophic vaginitis: Secondary | ICD-10-CM | POA: Diagnosis not present

## 2017-07-12 DIAGNOSIS — L820 Inflamed seborrheic keratosis: Secondary | ICD-10-CM | POA: Diagnosis not present

## 2017-07-12 DIAGNOSIS — L218 Other seborrheic dermatitis: Secondary | ICD-10-CM | POA: Diagnosis not present

## 2017-07-12 DIAGNOSIS — D225 Melanocytic nevi of trunk: Secondary | ICD-10-CM | POA: Diagnosis not present

## 2017-07-12 DIAGNOSIS — L918 Other hypertrophic disorders of the skin: Secondary | ICD-10-CM | POA: Diagnosis not present

## 2017-07-12 DIAGNOSIS — D1801 Hemangioma of skin and subcutaneous tissue: Secondary | ICD-10-CM | POA: Diagnosis not present

## 2017-08-03 ENCOUNTER — Ambulatory Visit (INDEPENDENT_AMBULATORY_CARE_PROVIDER_SITE_OTHER): Payer: Self-pay | Admitting: Family Medicine

## 2017-08-03 ENCOUNTER — Encounter: Payer: Self-pay | Admitting: Family Medicine

## 2017-08-03 VITALS — BP 128/80 | HR 90 | Temp 97.8°F | Wt 158.8 lb

## 2017-08-03 DIAGNOSIS — M797 Fibromyalgia: Secondary | ICD-10-CM

## 2017-08-03 DIAGNOSIS — M47816 Spondylosis without myelopathy or radiculopathy, lumbar region: Secondary | ICD-10-CM

## 2017-08-03 DIAGNOSIS — R52 Pain, unspecified: Secondary | ICD-10-CM

## 2017-08-03 DIAGNOSIS — I10 Essential (primary) hypertension: Secondary | ICD-10-CM

## 2017-08-03 MED ORDER — MORPHINE SULFATE ER 30 MG PO TBCR: 30.0000 mg | EXTENDED_RELEASE_TABLET | Freq: Two times a day (BID) | ORAL | 0 refills | Status: DC

## 2017-08-03 NOTE — Progress Notes (Signed)
   Subjective:    Patient ID: Ashley [name], female    DOB: 1960/11/20, 56 y.o.   MRN: [id number]  HPI Here to follow up. Her back pain has been stable, and her medications work well. She had been exercising daily by riding an elliptical bike, but she stopped a few months ago.    Review of Systems  Constitutional: Negative.   Respiratory: Negative.   Cardiovascular: Negative.   Musculoskeletal: Positive for back pain.       Objective:   Physical Exam  Constitutional: She appears well-developed and well-nourished.  Cardiovascular: Normal rate, regular rhythm, normal heart sounds and intact distal pulses.  Pulmonary/Chest: Effort normal and breath sounds normal. No respiratory distress. She has no wheezes. She has no rales.          Assessment & Plan:  Her back pain is stable. I suggested she get back into a regular exercise routine. Her HTN is stable.  [name], MD

## 2017-08-08 DIAGNOSIS — K210 Gastro-esophageal reflux disease with esophagitis: Secondary | ICD-10-CM | POA: Diagnosis not present

## 2017-08-08 DIAGNOSIS — K589 Irritable bowel syndrome without diarrhea: Secondary | ICD-10-CM | POA: Diagnosis not present

## 2017-08-18 DIAGNOSIS — F319 Bipolar disorder, unspecified: Secondary | ICD-10-CM | POA: Diagnosis not present

## 2017-09-28 ENCOUNTER — Telehealth: Payer: Self-pay | Admitting: Diagnostic Neuroimaging

## 2017-09-28 NOTE — Telephone Encounter (Signed)
LMVM for pt that order is still good, but per Raquel Sarna in authorization needs to come in for appt to evaluate since almost a yr.  I LMVM for pt to return call if still same issues. Need appt? If same?

## 2017-09-28 NOTE — Telephone Encounter (Signed)
Patient saw Dr. Leta Baptist 11-17-16 and he wanted her to schedule an MRI. She did not want to schedule then but has decided she would like to schedule MRI now. Does she need to come in for an office visit first or can an order be put in? Please call and advise.

## 2017-09-28 NOTE — Telephone Encounter (Signed)
The order is still good. But she will have to see Dr. Leta Baptist  because she hasn't been in the office in almost a year and I will need more recent clinical notes to do the authorization.

## 2017-09-29 NOTE — Telephone Encounter (Signed)
Please try authorization with prior order. If not approved, then setup new appt. -VRP

## 2017-09-29 NOTE — Telephone Encounter (Signed)
Pending faxed clinical notes to Health team for the authorization.

## 2017-09-29 NOTE — Telephone Encounter (Signed)
Will do!

## 2017-09-30 NOTE — Telephone Encounter (Signed)
Pt returned RN's call, I clarified the message and she is aware she will be waiting for Raquel Sarna to call.

## 2017-10-04 NOTE — Telephone Encounter (Signed)
I just received the notice that her MRI was approved. I sent the order to [facility] they will reach out to the patient to schedule.

## 2017-10-26 ENCOUNTER — Ambulatory Visit (INDEPENDENT_AMBULATORY_CARE_PROVIDER_SITE_OTHER): Payer: PPO | Admitting: Family Medicine

## 2017-10-26 ENCOUNTER — Encounter: Payer: Self-pay | Admitting: Family Medicine

## 2017-10-26 VITALS — BP 120/78 | Temp 98.3°F | Ht 62.0 in | Wt 162.6 lb

## 2017-10-26 DIAGNOSIS — Z0000 Encounter for general adult medical examination without abnormal findings: Secondary | ICD-10-CM | POA: Diagnosis not present

## 2017-10-26 LAB — BASIC METABOLIC PANEL
BUN: 14 mg/dL (ref 6–23)
CALCIUM: 9 mg/dL (ref 8.4–10.5)
CO2: 29 mEq/L (ref 19–32)
Chloride: 105 mEq/L (ref 96–112)
Creatinine, Ser: 0.76 mg/dL (ref 0.40–1.20)
GFR: 84.1 mL/min (ref >60.00)
GLUCOSE: 93 mg/dL (ref 70–99)
Potassium: 4.1 mEq/L (ref 3.5–5.1)
Sodium: 138 mEq/L (ref 135–145)

## 2017-10-26 LAB — POC URINALSYSI DIPSTICK (AUTOMATED)
BILIRUBIN UA: NEGATIVE
Glucose, UA: NEGATIVE
Ketones, UA: NEGATIVE
Leukocytes, UA: NEGATIVE
NITRITE UA: NEGATIVE
Protein, UA: NEGATIVE
RBC UA: NEGATIVE
Spec Grav, UA: 1.02 (ref 1.010–1.025)
UROBILINOGEN UA: 0.2 U/dL
pH, UA: 6 (ref 5.0–8.0)

## 2017-10-26 LAB — CBC WITH DIFFERENTIAL/PLATELET
BASOS PCT: 0.3 % (ref 0.0–3.0)
Basophils Absolute: 0 10*3/uL (ref 0.0–0.1)
Eosinophils Absolute: 0.1 10*3/uL (ref 0.0–0.7)
Eosinophils Relative: 2 % (ref 0.0–5.0)
HCT: 41.6 % (ref 36.0–46.0)
HEMOGLOBIN: 14 g/dL (ref 12.0–15.0)
LYMPHS ABS: 2.3 10*3/uL (ref 0.7–4.0)
LYMPHS PCT: 34.5 % (ref 12.0–46.0)
MCHC: 33.6 g/dL (ref 30.0–36.0)
MCV: 93.7 fl (ref 78.0–100.0)
MONO ABS: 0.7 10*3/uL (ref 0.1–1.0)
Monocytes Relative: 10 % (ref 3.0–12.0)
NEUTROS PCT: 53.2 % (ref 43.0–77.0)
Neutro Abs: 3.5 10*3/uL (ref 1.4–7.7)
Platelets: 241 10*3/uL (ref 150.0–400.0)
RBC: 4.44 Mil/uL (ref 3.87–5.11)
RDW: 12.6 % (ref 11.5–15.5)
WBC: 6.6 10*3/uL (ref 4.0–10.5)

## 2017-10-26 LAB — TSH: TSH: 1.46 u[IU]/mL (ref 0.35–4.50)

## 2017-10-26 LAB — HEPATIC FUNCTION PANEL
ALBUMIN: 3.8 g/dL (ref 3.5–5.2)
ALT: 22 U/L (ref 0–35)
AST: 18 U/L (ref 0–37)
Alkaline Phosphatase: 73 U/L (ref 39–117)
Bilirubin, Direct: 0.1 mg/dL (ref 0.0–0.3)
Total Bilirubin: 0.5 mg/dL (ref 0.2–1.2)
Total Protein: 6.1 g/dL (ref 6.0–8.3)

## 2017-10-26 LAB — LIPID PANEL
CHOLESTEROL: 187 mg/dL (ref 0–200)
HDL: 52 mg/dL (ref >39.00)
LDL CALC: 121 mg/dL — AB (ref 0–99)
NonHDL: 135.26
TRIGLYCERIDES: 71 mg/dL (ref 0.0–149.0)
Total CHOL/HDL Ratio: 4
VLDL: 14.2 mg/dL (ref 0.0–40.0)

## 2017-10-26 MED ORDER — POTASSIUM CHLORIDE ER 10 MEQ PO TBCR: 10.0000 meq | EXTENDED_RELEASE_TABLET | Freq: Two times a day (BID) | ORAL | 3 refills | Status: DC

## 2017-10-26 MED ORDER — OXYBUTYNIN CHLORIDE 5 MG PO TABS: 5.0000 mg | ORAL_TABLET | Freq: Two times a day (BID) | ORAL | 3 refills | Status: DC

## 2017-10-26 MED ORDER — POTASSIUM CHLORIDE ER 10 MEQ PO TBCR: 10.0000 meq | EXTENDED_RELEASE_TABLET | Freq: Two times a day (BID) | ORAL | 0 refills | Status: DC

## 2017-10-26 NOTE — Progress Notes (Signed)
   Subjective:    Patient ID: Ashley [name], female    DOB: 09/29/1960, 57 y.o.   MRN: [id number]  HPI Here for a well exam. She feels well. She had complained of constipation from her narcotic medciations, but since she started using Miralax daily this has resolved.    Review of Systems  Constitutional: Negative.   HENT: Negative.   Eyes: Negative.   Respiratory: Negative.   Cardiovascular: Negative.   Gastrointestinal: Negative.   Genitourinary: Negative for decreased urine volume, difficulty urinating, dyspareunia, dysuria, enuresis, flank pain, frequency, hematuria, pelvic pain and urgency.  Musculoskeletal: Positive for back pain.  Skin: Negative.   Neurological: Negative.   Psychiatric/Behavioral: Negative.        Objective:   Physical Exam  Constitutional: She is oriented to person, place, and time. She appears well-developed and well-nourished. No distress.  HENT:  Head: Normocephalic and atraumatic.  Right Ear: External ear normal.  Left Ear: External ear normal.  Nose: Nose normal.  Mouth/Throat: Oropharynx is clear and moist. No oropharyngeal exudate.  Eyes: Conjunctivae and EOM are normal. Pupils are equal, round, and reactive to light. No scleral icterus.  Neck: Normal range of motion. Neck supple. No JVD present. No thyromegaly present.  Cardiovascular: Normal rate, regular rhythm, normal heart sounds and intact distal pulses. Exam reveals no gallop and no friction rub.  No murmur heard. Pulmonary/Chest: Effort normal and breath sounds normal. No respiratory distress. She has no wheezes. She has no rales. She exhibits no tenderness.  Abdominal: Soft. Bowel sounds are normal. She exhibits no distension and no mass. There is no tenderness. There is no rebound and no guarding.  Musculoskeletal: Normal range of motion. She exhibits no edema or tenderness.  Lymphadenopathy:    She has no cervical adenopathy.  Neurological: She is alert and oriented to person,  place, and time. She has normal reflexes. No cranial nerve deficit. She exhibits normal muscle tone. Coordination normal.  Skin: Skin is warm and dry. No rash noted. No erythema.  Psychiatric: She has a normal mood and affect. Her behavior is normal. Judgment and thought content normal.          Assessment & Plan:  Well exam. We discussed diet and exercise. Get fasting labs.  [name], MD

## 2017-11-02 ENCOUNTER — Ambulatory Visit (INDEPENDENT_AMBULATORY_CARE_PROVIDER_SITE_OTHER): Payer: Self-pay | Admitting: Family Medicine

## 2017-11-02 ENCOUNTER — Encounter: Payer: Self-pay | Admitting: Family Medicine

## 2017-11-02 ENCOUNTER — Ambulatory Visit: Payer: Self-pay | Admitting: Family Medicine

## 2017-11-02 VITALS — BP 124/82 | HR 80 | Temp 97.8°F | Wt 166.6 lb

## 2017-11-02 DIAGNOSIS — G8918 Other acute postprocedural pain: Secondary | ICD-10-CM

## 2017-11-02 DIAGNOSIS — M545 Low back pain: Secondary | ICD-10-CM

## 2017-11-02 DIAGNOSIS — F1190 Opioid use, unspecified, uncomplicated: Secondary | ICD-10-CM | POA: Diagnosis not present

## 2017-11-02 DIAGNOSIS — G8929 Other chronic pain: Secondary | ICD-10-CM

## 2017-11-02 MED ORDER — OXYCODONE HCL 5 MG PO TABS: 5.0000 mg | ORAL_TABLET | Freq: Four times a day (QID) | ORAL | 0 refills | Status: DC | PRN

## 2017-11-02 MED ORDER — MORPHINE SULFATE ER 30 MG PO TBCR: 30.0000 mg | EXTENDED_RELEASE_TABLET | Freq: Two times a day (BID) | ORAL | 0 refills | Status: DC

## 2017-11-02 MED ORDER — METHOCARBAMOL 750 MG PO TABS: 750.0000 mg | ORAL_TABLET | Freq: Four times a day (QID) | ORAL | 5 refills | Status: DC | PRN

## 2017-11-02 MED ORDER — DICLOFENAC SODIUM 1 % TD GEL
2.0000 g | Freq: Four times a day (QID) | TRANSDERMAL | 11 refills | Status: DC
Start: 2017-11-02 — End: 2018-12-25

## 2017-11-02 NOTE — Progress Notes (Signed)
   Subjective:    Patient ID: Ashley [name], female    DOB: Feb 05, 1961, 57 y.o.   MRN: [id number]  HPI Here for pain management. She is doing well and has no concerns. She is still working full time.  Indication for chronic opioid: low back pain  Medication and dose: MS Contin 30 mg and oxycodone 5 mg  # pills per month: 60 and approx 29  Last UDS date: 11-02-17 Pain contract signed (Y/N): 11-02-17 Date narcotic database last reviewed (include red flags): 11-02-17     Review of Systems  Constitutional: Negative.   Respiratory: Negative.   Cardiovascular: Negative.   Musculoskeletal: Positive for back pain.  Neurological: Negative.        Objective:   Physical Exam  Constitutional: She is oriented to person, place, and time. She appears well-developed and well-nourished.  Cardiovascular: Normal rate, regular rhythm, normal heart sounds and intact distal pulses.  Pulmonary/Chest: Effort normal and breath sounds normal. No respiratory distress. She has no wheezes. She has no rales.  Neurological: She is alert and oriented to person, place, and time.          Assessment & Plan:  Pain management. Meds were refilled.  [name], MD

## 2017-11-03 ENCOUNTER — Ambulatory Visit: Payer: Self-pay | Admitting: Family Medicine

## 2017-11-04 ENCOUNTER — Ambulatory Visit: Payer: Self-pay | Admitting: Family Medicine

## 2017-11-06 LAB — PAIN MGMT, PROFILE 8 W/CONF, U
6 Acetylmorphine: NEGATIVE ng/mL
Alcohol Metabolites: POSITIVE ng/mL — AB
Alphahydroxyalprazolam: 264 ng/mL — ABNORMAL HIGH
Alphahydroxymidazolam: NEGATIVE ng/mL
Alphahydroxytriazolam: NEGATIVE ng/mL
Aminoclonazepam: NEGATIVE ng/mL
Amphetamines: NEGATIVE ng/mL
Benzodiazepines: POSITIVE ng/mL — AB
Buprenorphine, Urine: NEGATIVE ng/mL
Cocaine Metabolite: NEGATIVE ng/mL
Codeine: NEGATIVE ng/mL
Creatinine: 44.8 mg/dL
Ethyl Glucuronide (ETG): 11127 ng/mL — ABNORMAL HIGH
Ethyl Sulfate (ETS): 1808 ng/mL — ABNORMAL HIGH
Hydrocodone: NEGATIVE ng/mL
Hydromorphone: NEGATIVE ng/mL
Hydroxyethylflurazepam: NEGATIVE ng/mL
Lorazepam: NEGATIVE ng/mL
MDMA: NEGATIVE ng/mL
Marijuana Metabolite: NEGATIVE ng/mL
Morphine: >20000 ng/mL — ABNORMAL HIGH
Nordiazepam: NEGATIVE ng/mL
Norhydrocodone: NEGATIVE ng/mL
Noroxycodone: 745 ng/mL — ABNORMAL HIGH
Opiates: POSITIVE ng/mL — AB
Oxazepam: NEGATIVE ng/mL
Oxidant: NEGATIVE ug/mL
Oxycodone: 182 ng/mL — ABNORMAL HIGH
Oxycodone: POSITIVE ng/mL — AB
Oxymorphone: 86 ng/mL — ABNORMAL HIGH
Temazepam: NEGATIVE ng/mL
pH: 6.16

## 2017-11-08 ENCOUNTER — Ambulatory Visit
Admission: RE | Admit: 2017-11-08 | Discharge: 2017-11-08 | Disposition: A | Discharge status: Home / Self Care | Payer: PPO | Source: Ambulatory Visit | Attending: Diagnostic Neuroimaging | Admitting: Diagnostic Neuroimaging

## 2017-11-08 DIAGNOSIS — M545 Low back pain, unspecified: Secondary | ICD-10-CM

## 2017-11-08 DIAGNOSIS — Z79891 Long term (current) use of opiate analgesic: Secondary | ICD-10-CM

## 2017-11-08 DIAGNOSIS — R413 Other amnesia: Secondary | ICD-10-CM

## 2017-11-08 DIAGNOSIS — M797 Fibromyalgia: Secondary | ICD-10-CM

## 2017-11-08 DIAGNOSIS — G8929 Other chronic pain: Secondary | ICD-10-CM

## 2017-11-09 ENCOUNTER — Other Ambulatory Visit: Payer: Self-pay | Admitting: Family Medicine

## 2017-11-10 ENCOUNTER — Telehealth: Payer: Self-pay | Admitting: *Deleted

## 2017-11-10 DIAGNOSIS — F319 Bipolar disorder, unspecified: Secondary | ICD-10-CM | POA: Diagnosis not present

## 2017-11-10 NOTE — Telephone Encounter (Signed)
Received call back from patient. She stated she received message that her MRI was normal. She asked about getting the sleep study done. It was ordered last Feb by Dr Leta Baptist. She stated she was unsure if her insurance would cover. Patient then stated she would call her insurance company and ask about coverage.  This RN advised if she decided to have it done, to call this office and ask to speak with sleep lab.  Patient verbalized understanding, appreciation.

## 2017-11-10 NOTE — Telephone Encounter (Signed)
LVM informing patient that her MRI brain result is normal. Advised that if she decides to have sleep study, to call back as she initially has refused it per note in referral. Left office number for any questions.

## 2017-11-11 ENCOUNTER — Telehealth: Payer: Self-pay | Admitting: Family Medicine

## 2017-11-11 NOTE — Telephone Encounter (Signed)
Copied from [place]. Topic: Quick Communication - Rx Refill/Question >> Nov 11, 2017  4:09 PM Percell Belt A wrote: Medication: oxybutynin (DITROPAN) 5 MG tablet    Has the patient contacted their pharmacy? Yes    (Agent: If no, request that the patient contact the pharmacy for the refill.)   Preferred Pharmacy (with phone number or street name): pt stated she only a couple of days left  - [facility] [place]: Please be advised that RX refills may take up to 3 business days. We ask that you follow-up with your pharmacy.

## 2017-11-11 NOTE — Telephone Encounter (Signed)
Last OV 11/02/2017   Last refilled

## 2017-11-11 NOTE — Telephone Encounter (Signed)
Rx was refilled and printed and given to pt on 2/6 called and spoke with pt and she found the written Rx no refills needed.OK to close

## 2018-02-01 ENCOUNTER — Telehealth: Payer: Self-pay | Admitting: Family Medicine

## 2018-02-01 ENCOUNTER — Ambulatory Visit (INDEPENDENT_AMBULATORY_CARE_PROVIDER_SITE_OTHER): Payer: Self-pay | Admitting: Family Medicine

## 2018-02-01 ENCOUNTER — Other Ambulatory Visit: Payer: Self-pay

## 2018-02-01 ENCOUNTER — Encounter: Payer: Self-pay | Admitting: Family Medicine

## 2018-02-01 VITALS — BP 124/68 | HR 55 | Temp 98.0°F | Resp 15 | Ht 62.0 in | Wt 165.2 lb

## 2018-02-01 DIAGNOSIS — M545 Low back pain: Secondary | ICD-10-CM

## 2018-02-01 DIAGNOSIS — G8918 Other acute postprocedural pain: Secondary | ICD-10-CM

## 2018-02-01 DIAGNOSIS — F1190 Opioid use, unspecified, uncomplicated: Secondary | ICD-10-CM

## 2018-02-01 DIAGNOSIS — G8929 Other chronic pain: Secondary | ICD-10-CM

## 2018-02-01 MED ORDER — MORPHINE SULFATE ER 30 MG PO TBCR: 30.0000 mg | EXTENDED_RELEASE_TABLET | Freq: Two times a day (BID) | ORAL | 0 refills | Status: DC

## 2018-02-01 MED ORDER — OXYCODONE HCL 5 MG PO TABS
5.0000 mg | ORAL_TABLET | Freq: Four times a day (QID) | ORAL | 0 refills | Status: DC | PRN
Start: 2018-02-01 — End: 2018-05-03

## 2018-02-01 NOTE — Telephone Encounter (Signed)
Copied from [place] [id number]. Topic: Quick Communication - Rx Refill/Question >> Feb 01, 2018 11:31 AM Waylan Rocher, Lumin L wrote: Medication: oxyCODONE (OXY IR/ROXICODONE) 5 MG immediate release tablet  Has the patient contacted their pharmacy? Yes.   (Agent: If no, request that the patient contact the pharmacy for the refill.) Preferred Pharmacy (with phone number or street name): [id number] [place], [address] [place] [address] Phone: [phone] Fax: [phone] Agent: Please be advised that RX refills may take up to 3 business days. We ask that you follow-up with your pharmacy.  Call pharmacy back to give them a diagnostic code that gives more details than "chronic pain".

## 2018-02-01 NOTE — Progress Notes (Signed)
   Subjective:    Patient ID: Ashley [name], female    DOB: October 07, 1960, 57 y.o.   MRN: [id number]  HPI Here for pain management. She is doing well.  Indication for chronic opioid: low back pain Medication and dose: MS Contin 30 mg and Oxycodone 5 mg # pills per month: 60 and 200 Last UDS date: 11-02-17 Opioid Treatment Agreement signed (Y/N): 11-02-17 Opioid Treatment Agreement last reviewed with patient:  02-01-18 NCCSRS reviewed this encounter (include red flags):  02-01-18    Review of Systems  Constitutional: Negative.   Respiratory: Negative.   Cardiovascular: Negative.   Musculoskeletal: Positive for back pain.  Neurological: Negative.        Objective:   Physical Exam  Constitutional: She is oriented to person, place, and time. She appears well-developed and well-nourished.  Cardiovascular: Normal rate, regular rhythm, normal heart sounds and intact distal pulses.  Pulmonary/Chest: Effort normal and breath sounds normal.  Neurological: She is alert and oriented to person, place, and time.          Assessment & Plan:  Pain management. Meds were refilled.  [name], MD

## 2018-02-01 NOTE — Telephone Encounter (Signed)
This medication also has a qty limit of 180 per 30 days. Phone for prior Josem Kaufmann is [phone] - if more qty is needed.  [phone]

## 2018-02-02 DIAGNOSIS — F3181 Bipolar II disorder: Secondary | ICD-10-CM | POA: Diagnosis not present

## 2018-02-03 NOTE — Telephone Encounter (Signed)
Prior auth for Oxycodone 5mg  requested for exception limit and sent to [email or web address].

## 2018-02-07 NOTE — Telephone Encounter (Signed)
Coverage determination request received from [employer].  Form completed, signed by Dr. Sarajane Jews, and faxed back as requested. Fax confirmation received.

## 2018-03-07 NOTE — Telephone Encounter (Signed)
PA for oxycodone 5 mg tablets approved from 02/03/18-09/19/18.

## 2018-03-24 ENCOUNTER — Telehealth: Payer: Self-pay | Admitting: Family Medicine

## 2018-03-24 ENCOUNTER — Ambulatory Visit (INDEPENDENT_AMBULATORY_CARE_PROVIDER_SITE_OTHER): Payer: PPO | Admitting: Family Medicine

## 2018-03-24 ENCOUNTER — Encounter: Payer: Self-pay | Admitting: Family Medicine

## 2018-03-24 VITALS — BP 118/80 | HR 80 | Temp 98.2°F | Ht 62.0 in | Wt 166.6 lb

## 2018-03-24 DIAGNOSIS — M7711 Lateral epicondylitis, right elbow: Secondary | ICD-10-CM | POA: Diagnosis not present

## 2018-03-24 DIAGNOSIS — Z23 Encounter for immunization: Secondary | ICD-10-CM | POA: Diagnosis not present

## 2018-03-24 DIAGNOSIS — N318 Other neuromuscular dysfunction of bladder: Secondary | ICD-10-CM | POA: Diagnosis not present

## 2018-03-24 MED ORDER — SOLIFENACIN SUCCINATE 10 MG PO TABS: 10.0000 mg | ORAL_TABLET | Freq: Every day | ORAL | 5 refills | Status: DC

## 2018-03-24 MED ORDER — METHYLPREDNISOLONE 4 MG PO TBPK: ORAL_TABLET | ORAL | 0 refills | Status: DC

## 2018-03-24 MED ORDER — OXYBUTYNIN CHLORIDE ER 10 MG PO TB24: 10.0000 mg | ORAL_TABLET | Freq: Two times a day (BID) | ORAL | 2 refills | Status: DC

## 2018-03-24 NOTE — Progress Notes (Signed)
   Subjective:    Patient ID: Ashley [name], female    DOB: [birth year], 57 y.o.   MRN: [id number]  HPI Here for intermittent pain in the right elbow for about 6 weeks. No hx of trauma. No swelling or redness or warmth. Ibuprofen does not help but Voltaren gel does help a little. She also complains of urinary urgency. No frequency or burning. She has been taking Oxybutynin for this for several years. This used to help a lot but lately not so much.    Review of Systems  Constitutional: Negative.   Respiratory: Negative.   Cardiovascular: Negative.   Genitourinary: Positive for urgency. Negative for dysuria, frequency and hematuria.  Musculoskeletal: Positive for arthralgias.       Objective:   Physical Exam  Constitutional: She appears well-developed and well-nourished.  Cardiovascular: Normal rate, regular rhythm, normal heart sounds and intact distal pulses.  Pulmonary/Chest: Effort normal and breath sounds normal.  Abdominal: Soft. Bowel sounds are normal. She exhibits no distension and no mass. There is no tenderness. There is no rebound and no guarding. No hernia.  Musculoskeletal:  The right elbow has not swelling and ROM is full. She is tender over the lateral epicondyle           Assessment & Plan:  Lateral epicondylitis, use ice packs and a Medrol dose pack. Rest the arm as much as possible. Written out of work today through 03-30-18. For the OAB, switch to Vesicare 10 mg daily.  [name], MD

## 2018-03-24 NOTE — Telephone Encounter (Signed)
Copied from [place] [id number]. Topic: Quick Communication - Rx Refill/Question >> Mar 24, 2018  4:00 PM Oliver Pila B wrote: Medication: solifenacin (VESICARE) 10 MG tablet [[id number]]   Pt called b/c the medication above was prescribed to her today but is too expensive; pt is wanting to go back to oxybutynin (DITROPAN) 5 MG tablet [[id number]]  DISCONTINUED and just up the dosage b/c the medication was more economic, call pt to advise

## 2018-03-24 NOTE — Telephone Encounter (Signed)
Per Dr Sarajane Jews increase Oxybutynin to 10mg  twice a day.  I called the pt, informed her of this and she is aware the Rx was sent to her pharmacy.

## 2018-04-03 ENCOUNTER — Telehealth: Payer: Self-pay

## 2018-04-03 NOTE — Telephone Encounter (Signed)
Nurse Assessment Nurse: Kellie Simmering, RN, Manuela Schwartz Date/Time Ashley [name] Time): 03/31/2018 10:27:45 PM Confirm and document reason for call. If symptomatic, describe symptoms. ---Caller reports that she has constipation and had a BM on Wednesday, but still felt full. Took Miralax. Reports is on pain medication. Denies fever. Used enema x 2 today with no results.  Spoke with pt and she states she talked to her GI provider Saturday. He recommended she take Miralax every hour x4hrs and drink plenty of fluid. Pt complied and has had diarrhea since late Saturday. She states she has not been able to eat because she goes right to the bathroom. Advised pt to try BRAT foods and can add dose of soluble fiber supplement to help with diarrhea and with regularity. She will try this. She states she will call back or call GI if any further issues. Nothing further needed at this time.

## 2018-04-27 DIAGNOSIS — F3181 Bipolar II disorder: Secondary | ICD-10-CM | POA: Diagnosis not present

## 2018-05-03 ENCOUNTER — Encounter: Payer: Self-pay | Admitting: Family Medicine

## 2018-05-03 ENCOUNTER — Ambulatory Visit (INDEPENDENT_AMBULATORY_CARE_PROVIDER_SITE_OTHER): Payer: Self-pay | Admitting: Family Medicine

## 2018-05-03 VITALS — BP 150/80 | Temp 98.1°F | Wt 168.0 lb

## 2018-05-03 DIAGNOSIS — G8929 Other chronic pain: Secondary | ICD-10-CM

## 2018-05-03 DIAGNOSIS — G8918 Other acute postprocedural pain: Secondary | ICD-10-CM

## 2018-05-03 DIAGNOSIS — M545 Low back pain, unspecified: Secondary | ICD-10-CM

## 2018-05-03 DIAGNOSIS — F1190 Opioid use, unspecified, uncomplicated: Secondary | ICD-10-CM

## 2018-05-03 MED ORDER — OXYCODONE HCL 5 MG PO TABS: 10.0000 mg | ORAL_TABLET | Freq: Four times a day (QID) | ORAL | 0 refills | Status: DC | PRN

## 2018-05-03 MED ORDER — MORPHINE SULFATE ER 30 MG PO TBCR: 30.0000 mg | EXTENDED_RELEASE_TABLET | Freq: Two times a day (BID) | ORAL | 0 refills | Status: DC

## 2018-05-03 MED ORDER — OXYBUTYNIN CHLORIDE ER 5 MG PO TB24: 5.0000 mg | ORAL_TABLET | Freq: Two times a day (BID) | ORAL | 3 refills | Status: DC

## 2018-05-03 NOTE — Progress Notes (Signed)
   Subjective:    Patient ID: Ashley [name], female    DOB: [birth year], 57 y.o.   MRN: [id number]  HPI Here for pain management. She is doing well.  Indication for chronic opioid: low back pain  Medication and dose: MS Contin 30 mg and Oxycodone 5 mg  # pills per month: 60 and 200  Last UDS date: 11-02-17 Opioid Treatment Agreement signed (Y/N): 11-02-17 Opioid Treatment Agreement last reviewed with patient:  05-03-18 NCCSRS reviewed this encounter (include red flags):  05-03-18    Review of Systems  Constitutional: Negative.   Respiratory: Negative.   Cardiovascular: Negative.   Musculoskeletal: Positive for back pain.  Neurological: Negative.        Objective:   Physical Exam  Constitutional: She is oriented to person, place, and time. She appears well-developed and well-nourished.  Cardiovascular: Normal rate, regular rhythm, normal heart sounds and intact distal pulses.  Pulmonary/Chest: Effort normal and breath sounds normal.  Neurological: She is alert and oriented to person, place, and time.          Assessment & Plan:  Pain management, meds were refilled. [name], MD

## 2018-07-27 DIAGNOSIS — F3181 Bipolar II disorder: Secondary | ICD-10-CM | POA: Diagnosis not present

## 2018-07-28 ENCOUNTER — Ambulatory Visit (INDEPENDENT_AMBULATORY_CARE_PROVIDER_SITE_OTHER): Payer: PPO | Admitting: Family Medicine

## 2018-07-28 ENCOUNTER — Encounter: Payer: Self-pay | Admitting: Family Medicine

## 2018-07-28 VITALS — BP 106/62 | HR 88 | Temp 97.9°F | Wt 175.2 lb

## 2018-07-28 DIAGNOSIS — J209 Acute bronchitis, unspecified: Secondary | ICD-10-CM | POA: Diagnosis not present

## 2018-07-28 MED ORDER — AZITHROMYCIN 250 MG PO TABS: ORAL_TABLET | ORAL | 0 refills | Status: DC

## 2018-07-28 MED ORDER — FLUTICASONE PROPIONATE 50 MCG/ACT NA SUSP: 2.0000 | Freq: Every day | NASAL | 6 refills | Status: DC

## 2018-07-28 MED ORDER — HYDROCODONE-HOMATROPINE 5-1.5 MG/5ML PO SYRP: 5.0000 mL | ORAL_SOLUTION | ORAL | 0 refills | Status: DC | PRN

## 2018-07-28 NOTE — Progress Notes (Signed)
   Subjective:    Patient ID: Ashley [name], female    DOB: June 13, 1961, 57 y.o.   MRN: [id number]  HPI Here for 10 days of chest congestion and a dry cough. No fever. She had a E visit with a [employer] provider a week ago and they prescribed Doxycycline and Mucinex. She is no better.    Review of Systems  Constitutional: Negative.   HENT: Negative.   Eyes: Negative.   Respiratory: Positive for cough and chest tightness. Negative for shortness of breath and wheezing.        Objective:   Physical Exam  Constitutional: She appears well-developed and well-nourished.  HENT:  Right Ear: External ear normal.  Left Ear: External ear normal.  Nose: Nose normal.  Mouth/Throat: Oropharynx is clear and moist.  Eyes: Conjunctivae are normal.  Neck: No thyromegaly present.  Pulmonary/Chest: Effort normal. No stridor. No respiratory distress. She has no wheezes. She has no rales.  Loud rhonchi   Lymphadenopathy:    She has no cervical adenopathy.          Assessment & Plan:  Bronchitis, treat with a Zpack. She is written out of work today until 08-01-18.  [name], MD

## 2018-07-30 ENCOUNTER — Encounter: Payer: Self-pay | Admitting: Family Medicine

## 2018-07-31 ENCOUNTER — Encounter: Payer: Self-pay | Admitting: Family Medicine

## 2018-07-31 NOTE — Telephone Encounter (Signed)
Dr. Sarajane Jews please advise.  Do we need to bring her back in for OV? thanks

## 2018-08-02 ENCOUNTER — Encounter: Payer: Self-pay | Admitting: Family Medicine

## 2018-08-02 ENCOUNTER — Ambulatory Visit (INDEPENDENT_AMBULATORY_CARE_PROVIDER_SITE_OTHER): Payer: Self-pay | Admitting: Family Medicine

## 2018-08-02 VITALS — BP 120/82 | HR 91 | Temp 98.2°F | Wt 174.4 lb

## 2018-08-02 DIAGNOSIS — M797 Fibromyalgia: Secondary | ICD-10-CM

## 2018-08-02 DIAGNOSIS — G8918 Other acute postprocedural pain: Secondary | ICD-10-CM

## 2018-08-02 DIAGNOSIS — F1190 Opioid use, unspecified, uncomplicated: Secondary | ICD-10-CM

## 2018-08-02 DIAGNOSIS — J4541 Moderate persistent asthma with (acute) exacerbation: Secondary | ICD-10-CM

## 2018-08-02 MED ORDER — OXYCODONE HCL 5 MG PO TABS: 10.0000 mg | ORAL_TABLET | Freq: Four times a day (QID) | ORAL | 0 refills | Status: DC | PRN

## 2018-08-02 MED ORDER — MORPHINE SULFATE ER 30 MG PO TBCR: 30.0000 mg | EXTENDED_RELEASE_TABLET | Freq: Two times a day (BID) | ORAL | 0 refills | Status: DC

## 2018-08-02 MED ORDER — ALBUTEROL SULFATE HFA 108 (90 BASE) MCG/ACT IN AERS: 2.0000 | INHALATION_SPRAY | RESPIRATORY_TRACT | 5 refills | Status: DC | PRN

## 2018-08-02 MED ORDER — HYDROCODONE-HOMATROPINE 5-1.5 MG/5ML PO SYRP: 5.0000 mL | ORAL_SOLUTION | ORAL | 0 refills | Status: DC | PRN

## 2018-08-02 MED ORDER — ALBUTEROL SULFATE (2.5 MG/3ML) 0.083% IN NEBU: 2.5000 mg | INHALATION_SOLUTION | RESPIRATORY_TRACT | 5 refills | Status: DC | PRN

## 2018-08-02 MED ORDER — LEVOFLOXACIN 500 MG PO TABS: 500.0000 mg | ORAL_TABLET | Freq: Every day | ORAL | 0 refills | Status: AC

## 2018-08-02 NOTE — Progress Notes (Signed)
   Subjective:    Patient ID: Ashley [name], female    DOB: 24-Jun-1961, 57 y.o.   MRN: [id number]  HPI Here for pain management and to follow up a bronchitis. We saw her for chest congestion and a cough on 07-28-18 and she was given a Zpack. This did not help at all and iin fact she feels worse now. The cough is still unproductive. She is wheezing and feels SOB. No fever. As far as pain management, she is doing well.  Indication for chronic opioid: fibromyalgia Medication and dose: MS Contin 30 mg and Oxycodone 5 mg  # pills per month: 60 and 200 Last UDS date: 11-02-17 Opioid Treatment Agreement signed (Y/N): 11-02-17 Opioid Treatment Agreement last reviewed with patient:  08-02-18 NCCSRS reviewed this encounter (include red flags):  08-02-18    Review of Systems  Constitutional: Negative.   HENT: Negative.   Eyes: Negative.   Respiratory: Positive for cough, chest tightness, shortness of breath and wheezing.   Cardiovascular: Negative.   Musculoskeletal: Positive for myalgias.       Objective:   Physical Exam  Constitutional: She is oriented to person, place, and time. She appears well-developed and well-nourished.  HENT:  Right Ear: External ear normal.  Left Ear: External ear normal.  Nose: Nose normal.  Mouth/Throat: Oropharynx is clear and moist.  Eyes: Conjunctivae are normal.  Neck: No thyromegaly present.  Cardiovascular: Normal rate, regular rhythm, normal heart sounds and intact distal pulses.  Pulmonary/Chest: Effort normal. No stridor. She has no rales.  Scattered rhonchi and wheezes   Lymphadenopathy:    She has no cervical adenopathy.  Neurological: She is alert and oriented to person, place, and time.          Assessment & Plan:  For the asthmatic bronchitis, we will give her Levaquin for 10 days. Add an albuterol inhaler and albuterol per nebulizer as needed. For pain management, her meds were refilled.  [name], MD

## 2018-08-04 NOTE — Telephone Encounter (Signed)
She was seen OV

## 2018-08-09 DIAGNOSIS — N958 Other specified menopausal and perimenopausal disorders: Secondary | ICD-10-CM | POA: Diagnosis not present

## 2018-08-09 DIAGNOSIS — Z01419 Encounter for gynecological examination (general) (routine) without abnormal findings: Secondary | ICD-10-CM | POA: Diagnosis not present

## 2018-08-09 DIAGNOSIS — M8588 Other specified disorders of bone density and structure, other site: Secondary | ICD-10-CM | POA: Diagnosis not present

## 2018-08-09 DIAGNOSIS — Z6831 Body mass index (BMI) 31.0-31.9, adult: Secondary | ICD-10-CM | POA: Diagnosis not present

## 2018-08-09 DIAGNOSIS — Z1231 Encounter for screening mammogram for malignant neoplasm of breast: Secondary | ICD-10-CM | POA: Diagnosis not present

## 2018-08-09 DIAGNOSIS — N760 Acute vaginitis: Secondary | ICD-10-CM | POA: Diagnosis not present

## 2018-09-09 ENCOUNTER — Encounter: Payer: Self-pay | Admitting: Family Medicine

## 2018-09-11 NOTE — Telephone Encounter (Signed)
Dr. Fry please advise. Thanks  

## 2018-09-14 MED ORDER — MORPHINE SULFATE ER 30 MG PO TBCR: 30.0000 mg | EXTENDED_RELEASE_TABLET | Freq: Two times a day (BID) | ORAL | 0 refills | Status: AC

## 2018-09-14 NOTE — Telephone Encounter (Signed)
I sent in a new rx dated today

## 2018-10-04 ENCOUNTER — Encounter: Payer: Self-pay | Admitting: Family Medicine

## 2018-10-04 ENCOUNTER — Ambulatory Visit (INDEPENDENT_AMBULATORY_CARE_PROVIDER_SITE_OTHER): Payer: PPO

## 2018-10-04 ENCOUNTER — Ambulatory Visit (INDEPENDENT_AMBULATORY_CARE_PROVIDER_SITE_OTHER): Payer: PPO | Admitting: Family Medicine

## 2018-10-04 VITALS — BP 124/84 | HR 80 | Temp 98.1°F | Wt 178.5 lb

## 2018-10-04 DIAGNOSIS — M25551 Pain in right hip: Secondary | ICD-10-CM | POA: Diagnosis not present

## 2018-10-04 DIAGNOSIS — S76012A Strain of muscle, fascia and tendon of left hip, initial encounter: Secondary | ICD-10-CM

## 2018-10-04 DIAGNOSIS — M25552 Pain in left hip: Secondary | ICD-10-CM

## 2018-10-04 NOTE — Progress Notes (Signed)
   Subjective:    Patient ID: Ashley [name], female    DOB: [birth year], 58 y.o.   MRN: [id number]  HPI Here for 2 days of left hip pain that is centered in the anterior hip area and to a lesser extent the lateral hip area. No radiation down the leg. No recent trauma, but after not exercising for quite a long while she started working out on a eliptical machine at the gym several days ago. She worked out about 30-35 minutes each time. She is on her usual Oxycodone and MS Contin for chronic back pain.    Review of Systems  Constitutional: Negative.   Respiratory: Negative.   Cardiovascular: Negative.   Gastrointestinal: Negative.   Genitourinary: Negative.   Musculoskeletal: Positive for arthralgias.       Objective:   Physical Exam Constitutional:      General: She is not in acute distress.    Appearance: Normal appearance.  Cardiovascular:     Rate and Rhythm: Normal rate and regular rhythm.     Pulses: Normal pulses.     Heart sounds: Normal heart sounds.  Pulmonary:     Effort: Pulmonary effort is normal.     Breath sounds: Normal breath sounds.  Abdominal:     General: Abdomen is flat. Bowel sounds are normal. There is no distension.     Palpations: Abdomen is soft. There is no mass.     Tenderness: There is no abdominal tenderness. There is no guarding or rebound.     Hernia: No hernia is present.  Musculoskeletal:     Comments: She is quite tender in the anterior left hip area and in the left groin. Hip flexion against resistance is painful. ROM of the hip is full. Review of her hoip Xrays show a normal joint with very little evidence of arthritis.   Neurological:     Mental Status: She is alert.           Assessment & Plan:  Hip flexor strain. She will rest, apply ice, and take Naproxen as needed. After this heals, she can get back in the gym but I cautioned her to start very slowly and to advance her workouts very slowly to avoid injury.  [name], MD

## 2018-10-10 ENCOUNTER — Other Ambulatory Visit: Payer: Self-pay | Admitting: Family Medicine

## 2018-10-25 DIAGNOSIS — K589 Irritable bowel syndrome without diarrhea: Secondary | ICD-10-CM | POA: Diagnosis not present

## 2018-10-25 DIAGNOSIS — K210 Gastro-esophageal reflux disease with esophagitis: Secondary | ICD-10-CM | POA: Diagnosis not present

## 2018-10-30 ENCOUNTER — Ambulatory Visit (INDEPENDENT_AMBULATORY_CARE_PROVIDER_SITE_OTHER): Payer: Self-pay | Admitting: Family Medicine

## 2018-10-30 ENCOUNTER — Encounter: Payer: Self-pay | Admitting: Family Medicine

## 2018-10-30 VITALS — BP 122/82 | HR 86 | Temp 98.2°F | Wt 173.0 lb

## 2018-10-30 DIAGNOSIS — F1190 Opioid use, unspecified, uncomplicated: Secondary | ICD-10-CM

## 2018-10-30 DIAGNOSIS — M47816 Spondylosis without myelopathy or radiculopathy, lumbar region: Secondary | ICD-10-CM

## 2018-10-30 DIAGNOSIS — G8918 Other acute postprocedural pain: Secondary | ICD-10-CM

## 2018-10-30 DIAGNOSIS — M797 Fibromyalgia: Secondary | ICD-10-CM

## 2018-10-30 MED ORDER — MORPHINE SULFATE ER 30 MG PO TBCR: 30.0000 mg | EXTENDED_RELEASE_TABLET | Freq: Two times a day (BID) | ORAL | 0 refills | Status: AC

## 2018-10-30 MED ORDER — OXYCODONE HCL 5 MG PO TABS: 5.0000 mg | ORAL_TABLET | Freq: Four times a day (QID) | ORAL | 0 refills | Status: DC | PRN

## 2018-10-30 MED ORDER — MORPHINE SULFATE ER 30 MG PO TBCR: 30.0000 mg | EXTENDED_RELEASE_TABLET | Freq: Two times a day (BID) | ORAL | 0 refills | Status: DC

## 2018-10-30 NOTE — Progress Notes (Signed)
   Subjective:    Patient ID: Ashley [name], female    DOB: 1961-02-18, 58 y.o.   MRN: [id number]  HPI Here for pain management. She is doing well.  Indication for chronic opioid: fibromyalgia Medication and dose: MS Contin 30 mg and Oxycodone 5 mg  # pills per month: 60 and 200 Last UDS date: 10-30-18 Opioid Treatment Agreement signed (Y/N): 10-30-18 Opioid Treatment Agreement last reviewed with patient:   10-30-18 NCCSRS reviewed this encounter (include red flags):  10-30-18    Review of Systems  Constitutional: Negative.   Respiratory: Negative.   Cardiovascular: Negative.   Musculoskeletal: Positive for myalgias.  Neurological: Negative.        Objective:   Physical Exam Constitutional:      Appearance: Normal appearance.  Cardiovascular:     Rate and Rhythm: Normal rate and regular rhythm.     Pulses: Normal pulses.     Heart sounds: Normal heart sounds.  Pulmonary:     Effort: Pulmonary effort is normal.     Breath sounds: Normal breath sounds.  Neurological:     General: No focal deficit present.     Mental Status: She is alert and oriented to person, place, and time.           Assessment & Plan:  Pain management, meds were refilled.  [name], MD

## 2018-11-02 DIAGNOSIS — F3181 Bipolar II disorder: Secondary | ICD-10-CM | POA: Diagnosis not present

## 2018-11-04 LAB — PAIN MGMT, PROFILE 8 W/CONF, U
6 Acetylmorphine: NEGATIVE ng/mL (ref <10)
AMPHETAMINES: NEGATIVE ng/mL (ref <500)
Alcohol Metabolites: POSITIVE ng/mL — AB (ref <500)
Alphahydroxyalprazolam: 251 ng/mL — ABNORMAL HIGH (ref <25)
Alphahydroxymidazolam: NEGATIVE ng/mL (ref <50)
Alphahydroxytriazolam: NEGATIVE ng/mL (ref <50)
Aminoclonazepam: NEGATIVE ng/mL (ref <25)
Benzodiazepines: POSITIVE ng/mL — AB (ref <100)
Buprenorphine, Urine: NEGATIVE ng/mL (ref <5)
Cocaine Metabolite: NEGATIVE ng/mL (ref <150)
Codeine: NEGATIVE ng/mL (ref <50)
Creatinine: 70 mg/dL
Ethyl Glucuronide (ETG): 1335 ng/mL — ABNORMAL HIGH (ref <500)
Ethyl Sulfate (ETS): 112 ng/mL — ABNORMAL HIGH (ref <100)
HYDROCODONE: NEGATIVE ng/mL (ref <50)
Hydromorphone: 95 ng/mL — ABNORMAL HIGH (ref <50)
Hydroxyethylflurazepam: NEGATIVE ng/mL (ref <50)
Lorazepam: NEGATIVE ng/mL (ref <50)
MDMA: NEGATIVE ng/mL (ref <500)
Marijuana Metabolite: 16 ng/mL — ABNORMAL HIGH (ref <5)
Marijuana Metabolite: POSITIVE ng/mL — AB (ref <20)
Morphine: >20000 ng/mL — ABNORMAL HIGH (ref <50)
Nordiazepam: NEGATIVE ng/mL (ref <50)
Norhydrocodone: NEGATIVE ng/mL (ref <50)
Opiates: POSITIVE ng/mL — AB (ref <100)
Oxazepam: NEGATIVE ng/mL (ref <50)
Oxidant: NEGATIVE ug/mL (ref <200)
Oxycodone: NEGATIVE ng/mL (ref <100)
PH: 5.86 (ref 4.5–9.0)
TEMAZEPAM: NEGATIVE ng/mL (ref <50)

## 2018-12-04 ENCOUNTER — Other Ambulatory Visit: Payer: Self-pay | Admitting: Family Medicine

## 2018-12-12 ENCOUNTER — Encounter: Payer: PPO | Admitting: Family Medicine

## 2018-12-19 ENCOUNTER — Other Ambulatory Visit: Payer: Self-pay | Admitting: Family Medicine

## 2018-12-26 ENCOUNTER — Other Ambulatory Visit: Payer: Self-pay | Admitting: Family Medicine

## 2018-12-28 NOTE — Telephone Encounter (Signed)
Dr. Fry please advise on refill of medication.  Thanks  

## 2019-01-11 DIAGNOSIS — F3181 Bipolar II disorder: Secondary | ICD-10-CM | POA: Diagnosis not present

## 2019-01-25 ENCOUNTER — Ambulatory Visit (INDEPENDENT_AMBULATORY_CARE_PROVIDER_SITE_OTHER): Payer: PPO | Admitting: Family Medicine

## 2019-01-25 ENCOUNTER — Other Ambulatory Visit: Payer: Self-pay

## 2019-01-25 ENCOUNTER — Ambulatory Visit: Payer: PPO | Admitting: Family Medicine

## 2019-01-25 ENCOUNTER — Encounter: Payer: Self-pay | Admitting: Family Medicine

## 2019-01-25 VITALS — Wt 169.2 lb

## 2019-01-25 DIAGNOSIS — R1013 Epigastric pain: Secondary | ICD-10-CM

## 2019-01-25 DIAGNOSIS — K580 Irritable bowel syndrome with diarrhea: Secondary | ICD-10-CM | POA: Diagnosis not present

## 2019-01-25 DIAGNOSIS — K219 Gastro-esophageal reflux disease without esophagitis: Secondary | ICD-10-CM

## 2019-01-25 DIAGNOSIS — R197 Diarrhea, unspecified: Secondary | ICD-10-CM | POA: Diagnosis not present

## 2019-01-25 NOTE — Patient Instructions (Signed)
Imodium as needed if further diarrhea only.  Avoid dairy and red meat for 2 weeks.  Hold of on miralax today and resume once diarrhea resolved.  Take you omeprazole twice daily for 3 days.  Follow up next week as planned, but sooner if any worsening or you are not improving.  I hope you are feeling better soon! Seek care promptly if your symptoms worsen, new concerns arise or you are not improving with treatment.

## 2019-01-25 NOTE — Progress Notes (Signed)
Virtual Visit via Video Note  I connected with Ashley [name]  on 01/25/19 at 12:00 PM EDT by a video enabled telemedicine application and verified that I am speaking with the correct person using two identifiers.  Location patient: home Location provider:work or home office Persons participating in the virtual visit: patient, provider  I discussed the limitations of evaluation and management by telemedicine and the availability of in person appointments. The patient expressed understanding and agreed to proceed.   HPI:  -1.5 weeks ago had stomach ache -then 2 days ago had upset stomach, bad diarrhea for several days -yesterday was the worst with 4 episodes of watery diarrhea -she tried pepto, antacids, peppermint -ok this morning with no BM today and symptoms improved -denies hx travel, antibiotics, fevers, blood in the stools, vomiting, body aches, sore throat, dysuria, cough, SOB -she reports has been home mostly, when goes out wears mask and gloves -has hx IBS, but has not had issues in a long time; also reports has hx hiatal hernia and GERD and used to be on omeprazole 40mg  bid for many years with GI , but reduced it to 40mg  daily -hx chole and appendectomy -eating a lot of dairy to try to help this -sees Dr. Jason Coop, reports had colonosocpy 4 years ago and no diverticulosis   ROS: See pertinent positives and negatives per HPI.  Past Medical History:  Diagnosis Date  . Allergy   . Asthma   . Bipolar disorder ([place])    dr Shirlee More   . Chronic lower back pain    sees Dr. Milinda Pointer at [facility]   . Depression   . Fibromyalgia    dr Milinda Pointer [facility]  . GERD (gastroesophageal reflux disease)   . Headache(784.0)   . HTN (hypertension)   . IBS (irritable bowel syndrome)    dr Watt Climes  . Injury of lower back 09-23-05   Workers Comp case worker is Michelene Gardener (phone [phone] or fax [phone])  . Reflex sympathetic  dystrophy    left hand dr Annie Main and dr Nelva Bush    Past Surgical History:  Procedure Laterality Date  . APPENDECTOMY    . CARPAL TUNNEL RELEASE Bilateral   . CERVICAL SPINE SURGERY    . CHOLECYSTECTOMY    . COLONOSCOPY  Sept. 2016   per Dr. Watt Climes, clear, repeat in 5 yrs   . ESOPHAGOGASTRODUODENOSCOPY  Sept. 2016   per Dr. Watt Climes, benign gastric polyps  . left shouder  1996   separation, x 2  . NASAL SINUS SURGERY    . radiofrequency  ablations april 2010 to lower spine per dr Marjorie Smolder    . removal of Left great toenail Left 01/2016  . TRIGGER FINGER RELEASE Right     Family History  Problem Relation Age of Onset  . Hypertension Mother   . Cancer Father        lung  . Cancer Maternal Aunt   . Dementia Maternal Grandmother   . Stroke Maternal Grandmother   . Dementia Maternal Grandfather   . Heart attack Maternal Grandfather   . Alzheimer's disease Paternal Grandmother   . Heart attack Paternal Grandfather     SOCIAL HX: see hpi   Current Outpatient Medications:  .  alprazolam (XANAX) 2 MG tablet, Take 1 tablet (2 mg total) by mouth 3 (three) times daily as needed., Disp: 90 tablet, Rfl: 5 .  buPROPion (WELLBUTRIN XL) 150 MG 24 hr tablet, Take 150 mg by mouth daily., Disp: ,  Rfl:  .  calcium-vitamin D (OSCAL) 250-125 MG-UNIT per tablet, Take 1 tablet by mouth daily.  , Disp: , Rfl:  .  diclofenac sodium (VOLTAREN) 1 % GEL, APPLY 2 GRAMS TOPICALLY 4 TIMES DAILY, Disp: 100 g, Rfl: 10 .  Estradiol 10 % CREA, by Does not apply route., Disp: , Rfl:  .  fluticasone (FLONASE) 50 MCG/ACT nasal spray, Place 2 sprays into both nostrils daily., Disp: 16 g, Rfl: 6 .  Glucosamine-Chondroit-Vit C-Mn (GLUCOSAMINE CHONDR 1500 COMPLX PO), Take by mouth., Disp: , Rfl:  .  methocarbamol (ROBAXIN) 750 MG tablet, TAKE ONE TABLET EVERY 6 HOURS AS NEEDED FOR MUSCLE SPASM, Disp: 120 tablet, Rfl: 5 .  metoCLOPramide (REGLAN) 5 MG tablet, Take 5 mg at bedtime as needed by mouth. , Disp: , Rfl:  .   morphine (MS CONTIN) 30 MG 12 hr tablet, Take 1 tablet (30 mg total) by mouth every 12 (twelve) hours for 30 days., Disp: 60 tablet, Rfl: 0 .  Multiple Vitamin (MULTIVITAMIN) capsule, Take 1 capsule by mouth daily.  , Disp: , Rfl:  .  omeprazole (PRILOSEC) 40 MG capsule, Take 40 mg daily by mouth. , Disp: , Rfl: 1 .  oxybutynin (DITROPAN XL) 5 MG 24 hr tablet, Take 1 tablet (5 mg total) by mouth 2 (two) times daily., Disp: 180 tablet, Rfl: 3 .  oxybutynin (DITROPAN) 5 MG tablet, TAKE 1 TABLET BY MOUTH TWICE DAILY, Disp: 180 tablet, Rfl: 3 .  oxyCODONE (OXY IR/ROXICODONE) 5 MG immediate release tablet, Take 1 tablet (5 mg total) by mouth every 6 (six) hours as needed for up to 30 days for severe pain., Disp: 60 tablet, Rfl: 0 .  potassium chloride (K-DUR) 10 MEQ tablet, TAKE ONE TABLET TWICE DAILY, Disp: 180 tablet, Rfl: 1 .  risperiDONE (RISPERDAL) 2 MG tablet, Take 2 mg by mouth at bedtime. , Disp: , Rfl:  .  traZODone (DESYREL) 150 MG tablet, Take 75 mg by mouth at bedtime., Disp: , Rfl:  .  valACYclovir (VALTREX) 1000 MG tablet, Take 1,000 mg by mouth as needed. , Disp: , Rfl:   EXAM:  VITALS per patient if applicable: denies fever Today's Vitals   01/25/19 1141  Weight: 169 lb 3.2 oz (76.7 kg)   Body mass index is 30.95 kg/m.   GENERAL: alert, oriented, appears well and in no acute distress  HEENT: atraumatic, conjunttiva clear, no obvious abnormalities on inspection of external nose and ears  NECK: normal movements of the head and neck  LUNGS: on inspection no signs of respiratory distress, breathing rate appears normal, no obvious gross SOB, gasping or wheezing  CV: no obvious cyanosis  ABD: on patient palpation of entire abdomen - mild TTP in epigastric region, no rebound or guarding  MS: moves all visible extremities without noticeable abnormality  PSYCH/NEURO: pleasant and cooperative, no obvious depression or anxiety, speech and thought processing grossly  intact  ASSESSMENT AND PLAN:  Discussed the following assessment and plan:  Diarrhea, unspecified type  Dyspepsia  Irritable bowel syndrome with diarrhea  Gastroesophageal reflux disease, esophagitis presence not specified  -we discussed possible serious and likely etiologies, workup and treatment, treatment risks and return precautions, suspect IBS flare vs gastroenteritis vs other. In light of COVID 19 pandemic discussed signs and symptoms, transmission of this. Less likely given isolated and not around anyone sick. -after this discussion, Ashley [name] opted for cautious use of imodium, hold mirilax for a few days (takes due to narcotic medications), avoid dairy and red  meat x 2 weeks, plenty of fluids, close observation -follow up advised as planned next week (reports already scheduled with PCP) and sooner as needed -of course, we advised Ashley [name]  to return or notify a doctor immediately if symptoms worsen or persist or new concerns arise.    I discussed the assessment and treatment plan with the patient. The patient was provided an opportunity to ask questions and all were answered. The patient agreed with the plan and demonstrated an understanding of the instructions.   The patient was advised to call back or seek an in-person evaluation if the symptoms worsen or if the condition fails to improve as anticipated.   Lucretia Kern, DO

## 2019-02-01 ENCOUNTER — Other Ambulatory Visit: Payer: Self-pay

## 2019-02-01 ENCOUNTER — Encounter: Payer: Self-pay | Admitting: Family Medicine

## 2019-02-01 ENCOUNTER — Ambulatory Visit (INDEPENDENT_AMBULATORY_CARE_PROVIDER_SITE_OTHER): Payer: Self-pay | Admitting: Family Medicine

## 2019-02-01 DIAGNOSIS — G8918 Other acute postprocedural pain: Secondary | ICD-10-CM

## 2019-02-01 DIAGNOSIS — M797 Fibromyalgia: Secondary | ICD-10-CM

## 2019-02-01 DIAGNOSIS — F1190 Opioid use, unspecified, uncomplicated: Secondary | ICD-10-CM

## 2019-02-01 MED ORDER — MORPHINE SULFATE ER 30 MG PO TBCR: 30.0000 mg | EXTENDED_RELEASE_TABLET | Freq: Two times a day (BID) | ORAL | 0 refills | Status: DC

## 2019-02-01 MED ORDER — OXYCODONE HCL 5 MG PO TABS: 10.0000 mg | ORAL_TABLET | Freq: Four times a day (QID) | ORAL | 0 refills | Status: DC | PRN

## 2019-02-01 NOTE — Progress Notes (Signed)
Subjective:    Patient ID: Ashley [name], female    DOB: 07-12-1961, 58 y.o.   MRN: [id number]  HPI Virtual Visit via Video Note  I connected with the patient on 02/01/19 at 11:00 AM EDT by a video enabled telemedicine application and verified that I am speaking with the correct person using two identifiers.  Location patient: home Location provider:work or home office Persons participating in the virtual visit: patient, provider  I discussed the limitations of evaluation and management by telemedicine and the availability of in person appointments. The patient expressed understanding and agreed to proceed.   HPI: Here for pain management. She is doing well. She is walking 2 miles a day.  Indication for chronic opioid: fibromyalgia Medication and dose: MS Contin 30 mg and Oxycodone 5 mg  # pills per month: 60 and 200 Last UDS date: 10-30-18 Opioid Treatment Agreement signed (Y/N): 11-02-18 Opioid Treatment Agreement last reviewed with patient:  02-01-19 NCCSRS reviewed this encounter (include red flags):  02-01-19    ROS: See pertinent positives and negatives per HPI.  Past Medical History:  Diagnosis Date  . Allergy   . Asthma   . Bipolar disorder ([place])    dr Shirlee More   . Chronic lower back pain    sees Dr. Milinda Pointer at [facility]   . Depression   . Fibromyalgia    dr Milinda Pointer [place]  . GERD (gastroesophageal reflux disease)   . Headache(784.0)   . HTN (hypertension)   . IBS (irritable bowel syndrome)    dr Watt Climes  . Injury of lower back 09-23-05   Workers Comp case worker is Michelene Gardener (phone [phone] or fax [phone])  . Reflex sympathetic dystrophy    left hand dr Annie Main and dr Nelva Bush    Past Surgical History:  Procedure Laterality Date  . APPENDECTOMY    . CARPAL TUNNEL RELEASE Bilateral   . CERVICAL SPINE SURGERY    . CHOLECYSTECTOMY    . COLONOSCOPY  Sept. 2016   per Dr. Watt Climes, clear,  repeat in 5 yrs   . ESOPHAGOGASTRODUODENOSCOPY  Sept. 2016   per Dr. Watt Climes, benign gastric polyps  . left shouder  1996   separation, x 2  . NASAL SINUS SURGERY    . radiofrequency  ablations april 2010 to lower spine per dr Marjorie Smolder    . removal of Left great toenail Left 01/2016  . TRIGGER FINGER RELEASE Right     Family History  Problem Relation Age of Onset  . Hypertension Mother   . Cancer Father        lung  . Cancer Maternal Aunt   . Dementia Maternal Grandmother   . Stroke Maternal Grandmother   . Dementia Maternal Grandfather   . Heart attack Maternal Grandfather   . Alzheimer's disease Paternal Grandmother   . Heart attack Paternal Grandfather      Current Outpatient Medications:  .  alprazolam (XANAX) 2 MG tablet, Take 1 tablet (2 mg total) by mouth 3 (three) times daily as needed., Disp: 90 tablet, Rfl: 5 .  buPROPion (WELLBUTRIN XL) 150 MG 24 hr tablet, Take 150 mg by mouth daily., Disp: , Rfl:  .  calcium-vitamin D (OSCAL) 250-125 MG-UNIT per tablet, Take 1 tablet by mouth daily.  , Disp: , Rfl:  .  diclofenac sodium (VOLTAREN) 1 % GEL, APPLY 2 GRAMS TOPICALLY 4 TIMES DAILY, Disp: 100 g, Rfl: 10 .  Estradiol 10 % CREA, by Does not apply  route., Disp: , Rfl:  .  fluticasone (FLONASE) 50 MCG/ACT nasal spray, Place 2 sprays into both nostrils daily., Disp: 16 g, Rfl: 6 .  Glucosamine-Chondroit-Vit C-Mn (GLUCOSAMINE CHONDR 1500 COMPLX PO), Take by mouth., Disp: , Rfl:  .  methocarbamol (ROBAXIN) 750 MG tablet, TAKE ONE TABLET EVERY 6 HOURS AS NEEDED FOR MUSCLE SPASM, Disp: 120 tablet, Rfl: 5 .  metoCLOPramide (REGLAN) 5 MG tablet, Take 5 mg at bedtime as needed by mouth. , Disp: , Rfl:  .  Multiple Vitamin (MULTIVITAMIN) capsule, Take 1 capsule by mouth daily.  , Disp: , Rfl:  .  omeprazole (PRILOSEC) 40 MG capsule, Take 40 mg daily by mouth. , Disp: , Rfl: 1 .  oxybutynin (DITROPAN XL) 5 MG 24 hr tablet, Take 1 tablet (5 mg total) by mouth 2 (two) times daily., Disp:  180 tablet, Rfl: 3 .  oxybutynin (DITROPAN) 5 MG tablet, TAKE 1 TABLET BY MOUTH TWICE DAILY, Disp: 180 tablet, Rfl: 3 .  potassium chloride (K-DUR) 10 MEQ tablet, TAKE ONE TABLET TWICE DAILY, Disp: 180 tablet, Rfl: 1 .  risperiDONE (RISPERDAL) 2 MG tablet, Take 2 mg by mouth at bedtime. , Disp: , Rfl:  .  traZODone (DESYREL) 150 MG tablet, Take 75 mg by mouth at bedtime., Disp: , Rfl:  .  valACYclovir (VALTREX) 1000 MG tablet, Take 1,000 mg by mouth as needed. , Disp: , Rfl:  .  [START ON 04/03/2019] morphine (MS CONTIN) 30 MG 12 hr tablet, Take 1 tablet (30 mg total) by mouth every 12 (twelve) hours for 30 days., Disp: 60 tablet, Rfl: 0 .  [START ON 04/03/2019] oxyCODONE (OXY IR/ROXICODONE) 5 MG immediate release tablet, Take 2 tablets (10 mg total) by mouth every 6 (six) hours as needed for up to 30 days for severe pain., Disp: 200 tablet, Rfl: 0  EXAM:  VITALS per patient if applicable:  GENERAL: alert, oriented, appears well and in no acute distress  HEENT: atraumatic, conjunttiva clear, no obvious abnormalities on inspection of external nose and ears  NECK: normal movements of the head and neck  LUNGS: on inspection no signs of respiratory distress, breathing rate appears normal, no obvious gross SOB, gasping or wheezing  CV: no obvious cyanosis  MS: moves all visible extremities without noticeable abnormality  PSYCH/NEURO: pleasant and cooperative, no obvious depression or anxiety, speech and thought processing grossly intact  ASSESSMENT AND PLAN: Pain management, meds were refilled.  [name], MD  Discussed the following assessment and plan:  Acute postoperative pain - Plan: oxyCODONE (OXY IR/ROXICODONE) 5 MG immediate release tablet, DISCONTINUED: oxyCODONE (OXY IR/ROXICODONE) 5 MG immediate release tablet, DISCONTINUED: oxyCODONE (OXY IR/ROXICODONE) 5 MG immediate release tablet     I discussed the assessment and treatment plan with the patient. The patient was provided  an opportunity to ask questions and all were answered. The patient agreed with the plan and demonstrated an understanding of the instructions.   The patient was advised to call back or seek an in-person evaluation if the symptoms worsen or if the condition fails to improve as anticipated.     Review of Systems     Objective:   Physical Exam        Assessment & Plan:

## 2019-02-02 ENCOUNTER — Telehealth: Payer: Self-pay | Admitting: Family Medicine

## 2019-02-02 NOTE — Telephone Encounter (Signed)
Copied from [place] [id number]. Topic: General - Inquiry >> Feb 02, 2019  1:41 PM Richardo Priest, [place] wrote: Reason for CRM: Vicente Males with [facility] is calling in stating patient is requesting her morphine (MS CONTIN) 30 MG 12 hr tablet, be refilled early due to going out of down. Pharmacy states this is early for them and they are needing approval before to do so. Call back is [phone]. It is okay to ask for Vicente Males or [place].

## 2019-02-02 NOTE — Telephone Encounter (Signed)
Dr. Fry please advise. Thanks  

## 2019-02-12 NOTE — Telephone Encounter (Signed)
A one time early refill is okay with me

## 2019-02-14 ENCOUNTER — Encounter: Payer: Self-pay | Admitting: Family Medicine

## 2019-02-14 NOTE — Telephone Encounter (Signed)
Dr. Fry please advise. Thanks  

## 2019-02-15 ENCOUNTER — Encounter: Payer: Self-pay | Admitting: Family Medicine

## 2019-02-15 NOTE — Telephone Encounter (Signed)
Dr. Sarajane Jews please advise on letter that pt is requesting now.  Thanks

## 2019-02-15 NOTE — Telephone Encounter (Signed)
This letter is complete and she can download this from My Chart

## 2019-02-15 NOTE — Telephone Encounter (Signed)
See my other answer  

## 2019-02-15 NOTE — Telephone Encounter (Signed)
Yes her asthma would put her at a higher risk, so I advise she not work with the public quite yet

## 2019-02-15 NOTE — Telephone Encounter (Signed)
Dr. Fry please advise. Thanks  

## 2019-03-20 ENCOUNTER — Encounter: Payer: Self-pay | Admitting: Family Medicine

## 2019-03-22 NOTE — Telephone Encounter (Signed)
Set up a Doxy visit to take care of these things

## 2019-03-27 ENCOUNTER — Encounter: Payer: Self-pay | Admitting: Family Medicine

## 2019-03-27 ENCOUNTER — Ambulatory Visit (INDEPENDENT_AMBULATORY_CARE_PROVIDER_SITE_OTHER): Payer: PPO | Admitting: Family Medicine

## 2019-03-27 ENCOUNTER — Other Ambulatory Visit: Payer: Self-pay

## 2019-03-27 VITALS — BP 110/72 | HR 85 | Temp 98.3°F | Ht 63.0 in | Wt 172.1 lb

## 2019-03-27 DIAGNOSIS — E538 Deficiency of other specified B group vitamins: Secondary | ICD-10-CM

## 2019-03-27 DIAGNOSIS — Z0000 Encounter for general adult medical examination without abnormal findings: Secondary | ICD-10-CM | POA: Diagnosis not present

## 2019-03-27 LAB — LIPID PANEL
Cholesterol: 169 mg/dL (ref 0–200)
HDL: 48.3 mg/dL (ref >39.00)
LDL Cholesterol: 97 mg/dL (ref 0–99)
NonHDL: 120.57
Total CHOL/HDL Ratio: 3
Triglycerides: 117 mg/dL (ref 0.0–149.0)
VLDL: 23.4 mg/dL (ref 0.0–40.0)

## 2019-03-27 LAB — CBC WITH DIFFERENTIAL/PLATELET
Basophils Absolute: 0.1 10*3/uL (ref 0.0–0.1)
Basophils Relative: 0.9 % (ref 0.0–3.0)
Eosinophils Absolute: 0.1 10*3/uL (ref 0.0–0.7)
Eosinophils Relative: 1.4 % (ref 0.0–5.0)
HCT: 40.2 % (ref 36.0–46.0)
Hemoglobin: 13.7 g/dL (ref 12.0–15.0)
Lymphocytes Relative: 26.4 % (ref 12.0–46.0)
Lymphs Abs: 2.1 10*3/uL (ref 0.7–4.0)
MCHC: 34.1 g/dL (ref 30.0–36.0)
MCV: 91.5 fl (ref 78.0–100.0)
Monocytes Absolute: 0.7 10*3/uL (ref 0.1–1.0)
Monocytes Relative: 8.4 % (ref 3.0–12.0)
Neutro Abs: 5 10*3/uL (ref 1.4–7.7)
Neutrophils Relative %: 62.9 % (ref 43.0–77.0)
Platelets: 237 10*3/uL (ref 150.0–400.0)
RBC: 4.4 Mil/uL (ref 3.87–5.11)
RDW: 12.9 % (ref 11.5–15.5)
WBC: 7.9 10*3/uL (ref 4.0–10.5)

## 2019-03-27 LAB — HEPATIC FUNCTION PANEL
ALT: 26 U/L (ref 0–35)
AST: 27 U/L (ref 0–37)
Albumin: 4 g/dL (ref 3.5–5.2)
Alkaline Phosphatase: 106 U/L (ref 39–117)
Bilirubin, Direct: 0.1 mg/dL (ref 0.0–0.3)
Total Bilirubin: 0.5 mg/dL (ref 0.2–1.2)
Total Protein: 6.3 g/dL (ref 6.0–8.3)

## 2019-03-27 LAB — BASIC METABOLIC PANEL
BUN: 11 mg/dL (ref 6–23)
CO2: 30 mEq/L (ref 19–32)
Calcium: 9.1 mg/dL (ref 8.4–10.5)
Chloride: 103 mEq/L (ref 96–112)
Creatinine, Ser: 0.89 mg/dL (ref 0.40–1.20)
GFR: 65.6 mL/min (ref >60.00)
Glucose, Bld: 87 mg/dL (ref 70–99)
Potassium: 4.1 mEq/L (ref 3.5–5.1)
Sodium: 139 mEq/L (ref 135–145)

## 2019-03-27 LAB — VITAMIN B12: Vitamin B-12: 442 pg/mL (ref 211–911)

## 2019-03-27 LAB — TSH: TSH: 3.21 u[IU]/mL (ref 0.35–4.50)

## 2019-03-27 MED ORDER — PHENAZOPYRIDINE HCL 200 MG PO TABS: 200.0000 mg | ORAL_TABLET | Freq: Three times a day (TID) | ORAL | 0 refills | Status: DC | PRN

## 2019-03-27 NOTE — Progress Notes (Signed)
   Subjective:    Patient ID: Ashley [name], female    DOB: Aug 17, 1961, 58 y.o.   MRN: [id number]  HPI Here for a well exam. Her only complaint is an ongoing UTI. This started last week with urinary burning and urgency and back pain. She had an E visit with a nurse from her insurance company and they gave her 7 days of Macrobid and some pyridium. She feels a little better now. She notes that she and her husband (also my patient) are starting to see a marriage Social worker.     Review of Systems  Constitutional: Negative.   HENT: Negative.   Eyes: Negative.   Respiratory: Negative.   Cardiovascular: Negative.   Gastrointestinal: Negative.   Genitourinary: Positive for dysuria, flank pain, frequency and urgency. Negative for decreased urine volume, difficulty urinating, dyspareunia, enuresis, hematuria and pelvic pain.  Musculoskeletal: Positive for back pain.  Skin: Negative.   Neurological: Negative.   Psychiatric/Behavioral: Negative.        Objective:   Physical Exam Constitutional:      General: She is not in acute distress.    Appearance: She is well-developed.  HENT:     Head: Normocephalic and atraumatic.     Right Ear: External ear normal.     Left Ear: External ear normal.     Nose: Nose normal.     Mouth/Throat:     Pharynx: No oropharyngeal exudate.  Eyes:     General: No scleral icterus.    Conjunctiva/sclera: Conjunctivae normal.     Pupils: Pupils are equal, round, and reactive to light.  Neck:     Musculoskeletal: Normal range of motion and neck supple.     Thyroid: No thyromegaly.     Vascular: No JVD.  Cardiovascular:     Rate and Rhythm: Normal rate and regular rhythm.     Heart sounds: Normal heart sounds. No murmur. No friction rub. No gallop.   Pulmonary:     Effort: Pulmonary effort is normal. No respiratory distress.     Breath sounds: Normal breath sounds. No wheezing or rales.  Chest:     Chest wall: No tenderness.  Abdominal:     General:  Bowel sounds are normal. There is no distension.     Palpations: Abdomen is soft. There is no mass.     Tenderness: There is no abdominal tenderness. There is no guarding or rebound.  Musculoskeletal: Normal range of motion.        General: No tenderness.  Lymphadenopathy:     Cervical: No cervical adenopathy.  Skin:    General: Skin is warm and dry.     Findings: No erythema or rash.  Neurological:     Mental Status: She is alert and oriented to person, place, and time.     Cranial Nerves: No cranial nerve deficit.     Motor: No abnormal muscle tone.     Coordination: Coordination normal.     Deep Tendon Reflexes: Reflexes are normal and symmetric. Reflexes normal.  Psychiatric:        Behavior: Behavior normal.        Thought Content: Thought content normal.        Judgment: Judgment normal.           Assessment & Plan:  Well exam. We discussed diet and exercise. Get fasting labs. She will finish out the [place].  [name], MD

## 2019-03-28 ENCOUNTER — Encounter: Payer: Self-pay | Admitting: *Deleted

## 2019-04-05 DIAGNOSIS — F3181 Bipolar II disorder: Secondary | ICD-10-CM | POA: Diagnosis not present

## 2019-04-18 ENCOUNTER — Other Ambulatory Visit: Payer: Self-pay | Admitting: Family Medicine

## 2019-04-30 ENCOUNTER — Other Ambulatory Visit: Payer: Self-pay

## 2019-04-30 ENCOUNTER — Ambulatory Visit (INDEPENDENT_AMBULATORY_CARE_PROVIDER_SITE_OTHER): Payer: Self-pay | Admitting: Family Medicine

## 2019-04-30 ENCOUNTER — Encounter: Payer: Self-pay | Admitting: Family Medicine

## 2019-04-30 VITALS — BP 120/70 | HR 118 | Temp 98.1°F | Wt 176.2 lb

## 2019-04-30 DIAGNOSIS — F1190 Opioid use, unspecified, uncomplicated: Secondary | ICD-10-CM | POA: Diagnosis not present

## 2019-04-30 DIAGNOSIS — G8918 Other acute postprocedural pain: Secondary | ICD-10-CM | POA: Diagnosis not present

## 2019-04-30 DIAGNOSIS — M5432 Sciatica, left side: Secondary | ICD-10-CM

## 2019-04-30 DIAGNOSIS — M797 Fibromyalgia: Secondary | ICD-10-CM

## 2019-04-30 MED ORDER — OXYCODONE HCL 5 MG PO TABS: 10.0000 mg | ORAL_TABLET | ORAL | 0 refills | Status: DC | PRN

## 2019-04-30 MED ORDER — MORPHINE SULFATE ER 30 MG PO TBCR: 30.0000 mg | EXTENDED_RELEASE_TABLET | Freq: Two times a day (BID) | ORAL | 0 refills | Status: DC

## 2019-04-30 NOTE — Progress Notes (Signed)
   Subjective:    Patient ID: Ashley [name], female    DOB: 03-08-1961, 58 y.o.   MRN: [id number]  HPI Here for pain management. Her fibromyalgia has been fairly stable. Her community pool recently opened and she has been doing water aerobics, which have been helpful. In addition she mentions low back pain which radiates down the left leg to the foot. This started about 3 weeks ago. Of note she had an MRI in 2008 which showed a bulging disc at L4-5. She denies any weakness or numbness in the leg.  Indication for chronic opioid: fibromyalgia  Medication and dose: MS Contin 30 mg and Oxycodone 5 mg  # pills per month: 60 and 90 Last UDS date: 10-30-18 Opioid Treatment Agreement signed (Y/N): 11-02-18 Opioid Treatment Agreement last reviewed with patient:  04-30-19 NCCSRS reviewed this encounter (include red flags): Yes    Review of Systems  Constitutional: Negative.   Respiratory: Negative.   Cardiovascular: Negative.   Musculoskeletal: Positive for back pain and myalgias.       Objective:   Physical Exam Constitutional:      Appearance: Normal appearance.  Cardiovascular:     Rate and Rhythm: Normal rate and regular rhythm.     Pulses: Normal pulses.     Heart sounds: Normal heart sounds.  Pulmonary:     Effort: Pulmonary effort is normal.     Breath sounds: Normal breath sounds.  Neurological:     Mental Status: She is alert.           Assessment & Plan:  Pain management, meds were refilled. She also has some sciatica now, so we will send her to PT for this. [name], MD

## 2019-06-07 ENCOUNTER — Other Ambulatory Visit: Payer: Self-pay

## 2019-06-07 DIAGNOSIS — Z20822 Contact with and (suspected) exposure to covid-19: Secondary | ICD-10-CM

## 2019-06-07 DIAGNOSIS — R6889 Other general symptoms and signs: Secondary | ICD-10-CM | POA: Diagnosis not present

## 2019-06-08 LAB — NOVEL CORONAVIRUS, NAA: SARS-CoV-2, NAA: NOT DETECTED

## 2019-06-28 DIAGNOSIS — F3181 Bipolar II disorder: Secondary | ICD-10-CM | POA: Diagnosis not present

## 2019-08-01 ENCOUNTER — Ambulatory Visit: Payer: PPO | Admitting: Family Medicine

## 2019-08-02 ENCOUNTER — Other Ambulatory Visit: Payer: Self-pay

## 2019-08-02 ENCOUNTER — Encounter: Payer: Self-pay | Admitting: Family Medicine

## 2019-08-02 ENCOUNTER — Ambulatory Visit (INDEPENDENT_AMBULATORY_CARE_PROVIDER_SITE_OTHER): Payer: Self-pay | Admitting: Family Medicine

## 2019-08-02 VITALS — BP 110/70 | HR 97 | Temp 98.0°F | Ht 63.0 in | Wt 178.0 lb

## 2019-08-02 DIAGNOSIS — F1190 Opioid use, unspecified, uncomplicated: Secondary | ICD-10-CM

## 2019-08-02 DIAGNOSIS — G8918 Other acute postprocedural pain: Secondary | ICD-10-CM | POA: Diagnosis not present

## 2019-08-02 DIAGNOSIS — M797 Fibromyalgia: Secondary | ICD-10-CM | POA: Diagnosis not present

## 2019-08-02 MED ORDER — OXYCODONE HCL 5 MG PO TABS: 5.0000 mg | ORAL_TABLET | Freq: Three times a day (TID) | ORAL | 0 refills | Status: DC | PRN

## 2019-08-02 MED ORDER — MORPHINE SULFATE ER 30 MG PO TBCR: 30.0000 mg | EXTENDED_RELEASE_TABLET | Freq: Two times a day (BID) | ORAL | 0 refills | Status: DC

## 2019-08-02 MED ORDER — MORPHINE SULFATE ER 30 MG PO TBCR: 30.0000 mg | EXTENDED_RELEASE_TABLET | Freq: Two times a day (BID) | ORAL | 0 refills | Status: AC

## 2019-08-02 MED ORDER — MELOXICAM 15 MG PO TABS: 15.0000 mg | ORAL_TABLET | Freq: Every day | ORAL | 3 refills | Status: DC

## 2019-08-02 NOTE — Progress Notes (Signed)
   Subjective:    Patient ID: Ashley [name], female    DOB: 1960/12/22, 58 y.o.   MRN: [id number]  HPI Here for pain management. She has been having more morning stiffness lately that takes most of the mornings to wear off.  Indication for chronic opioid: fibromyalgia  Medication and dose: MS Contin 30 mg and Oxycodone 5 mg  # pills per month: 60 and 90 Last UDS date: 10-30-18 Opioid Treatment Agreement signed (Y/N): 11-02-18 Opioid Treatment Agreement last reviewed with patient:  08-02-19 NCCSRS reviewed this encounter (include red flags): Yes   Review of Systems     Objective:   Physical Exam        Assessment & Plan:  Pain management, meds were refilled. We will add Meloxicam daily to reduce inflammation.  [name], MD

## 2019-08-14 DIAGNOSIS — Z6832 Body mass index (BMI) 32.0-32.9, adult: Secondary | ICD-10-CM | POA: Diagnosis not present

## 2019-08-14 DIAGNOSIS — Z01419 Encounter for gynecological examination (general) (routine) without abnormal findings: Secondary | ICD-10-CM | POA: Diagnosis not present

## 2019-08-14 DIAGNOSIS — Z1231 Encounter for screening mammogram for malignant neoplasm of breast: Secondary | ICD-10-CM | POA: Diagnosis not present

## 2019-09-05 ENCOUNTER — Ambulatory Visit: Payer: PPO | Attending: Internal Medicine

## 2019-09-05 ENCOUNTER — Other Ambulatory Visit: Payer: Self-pay

## 2019-09-05 DIAGNOSIS — Z20822 Contact with and (suspected) exposure to covid-19: Secondary | ICD-10-CM

## 2019-09-05 DIAGNOSIS — Z20828 Contact with and (suspected) exposure to other viral communicable diseases: Secondary | ICD-10-CM | POA: Diagnosis not present

## 2019-09-07 LAB — NOVEL CORONAVIRUS, NAA: SARS-CoV-2, NAA: NOT DETECTED

## 2019-09-11 DIAGNOSIS — H52223 Regular astigmatism, bilateral: Secondary | ICD-10-CM | POA: Diagnosis not present

## 2019-09-11 DIAGNOSIS — H524 Presbyopia: Secondary | ICD-10-CM | POA: Diagnosis not present

## 2019-09-11 DIAGNOSIS — H16223 Keratoconjunctivitis sicca, not specified as Sjogren's, bilateral: Secondary | ICD-10-CM | POA: Diagnosis not present

## 2019-09-11 DIAGNOSIS — H5213 Myopia, bilateral: Secondary | ICD-10-CM | POA: Diagnosis not present

## 2019-09-20 DIAGNOSIS — F3181 Bipolar II disorder: Secondary | ICD-10-CM | POA: Diagnosis not present

## 2019-10-07 ENCOUNTER — Other Ambulatory Visit: Payer: Self-pay | Admitting: Family Medicine

## 2019-10-11 DIAGNOSIS — D485 Neoplasm of uncertain behavior of skin: Secondary | ICD-10-CM | POA: Diagnosis not present

## 2019-10-11 DIAGNOSIS — L728 Other follicular cysts of the skin and subcutaneous tissue: Secondary | ICD-10-CM | POA: Diagnosis not present

## 2019-10-11 DIAGNOSIS — L821 Other seborrheic keratosis: Secondary | ICD-10-CM | POA: Diagnosis not present

## 2019-10-11 DIAGNOSIS — D225 Melanocytic nevi of trunk: Secondary | ICD-10-CM | POA: Diagnosis not present

## 2019-10-11 DIAGNOSIS — D1801 Hemangioma of skin and subcutaneous tissue: Secondary | ICD-10-CM | POA: Diagnosis not present

## 2019-10-11 DIAGNOSIS — L570 Actinic keratosis: Secondary | ICD-10-CM | POA: Diagnosis not present

## 2019-11-01 ENCOUNTER — Other Ambulatory Visit: Payer: Self-pay

## 2019-11-02 ENCOUNTER — Encounter: Payer: Self-pay | Admitting: Family Medicine

## 2019-11-02 ENCOUNTER — Ambulatory Visit (INDEPENDENT_AMBULATORY_CARE_PROVIDER_SITE_OTHER): Payer: Self-pay | Admitting: Family Medicine

## 2019-11-02 VITALS — BP 130/68 | HR 94 | Temp 97.3°F | Wt 182.6 lb

## 2019-11-02 DIAGNOSIS — F1190 Opioid use, unspecified, uncomplicated: Secondary | ICD-10-CM | POA: Diagnosis not present

## 2019-11-02 DIAGNOSIS — G8918 Other acute postprocedural pain: Secondary | ICD-10-CM

## 2019-11-02 DIAGNOSIS — M797 Fibromyalgia: Secondary | ICD-10-CM

## 2019-11-02 MED ORDER — OXYCODONE HCL 5 MG PO TABS: 5.0000 mg | ORAL_TABLET | Freq: Two times a day (BID) | ORAL | 0 refills | Status: DC | PRN

## 2019-11-02 MED ORDER — DICLOFENAC SODIUM 1 % EX GEL: 4.0000 g | Freq: Four times a day (QID) | CUTANEOUS | 11 refills | Status: DC

## 2019-11-02 MED ORDER — MORPHINE SULFATE ER 30 MG PO TBCR: 30.0000 mg | EXTENDED_RELEASE_TABLET | Freq: Two times a day (BID) | ORAL | 0 refills | Status: DC

## 2019-11-02 MED ORDER — METHOCARBAMOL 750 MG PO TABS: ORAL_TABLET | ORAL | 5 refills | Status: DC

## 2019-11-02 MED ORDER — MELOXICAM 15 MG PO TABS: 15.0000 mg | ORAL_TABLET | Freq: Every day | ORAL | 3 refills | Status: DC

## 2019-11-02 NOTE — Progress Notes (Signed)
   Subjective:    Patient ID: Ashley [name], female    DOB: [birth year], 59 y.o.   MRN: [id number]  HPI Here for pain management. She is doing well. Last time we added Meloxicam to her regimen, and this has helped a lot.  Indication for chronic opioid: fibromyalgia Medication and dose: MS Contin 30 mg and Oxycodone 5 mg # pills per month: 60 and 60 Last UDS date: 11-01-19 Opioid Treatment Agreement signed (Y/N): 11-02-18 Opioid Treatment Agreement last reviewed with patient:  11-01-19 NCCSRS reviewed this encounter (include red flags): Yes    Review of Systems     Objective:   Physical Exam        Assessment & Plan:  Pain management, meds were refilled.  Ashley Penna, MD

## 2019-11-04 LAB — PAIN MGMT, PROFILE 8 W/CONF, U
6 Acetylmorphine: NEGATIVE ng/mL
Alcohol Metabolites: POSITIVE ng/mL — AB (ref <500)
Alphahydroxyalprazolam: 154 ng/mL
Alphahydroxymidazolam: NEGATIVE ng/mL
Alphahydroxytriazolam: NEGATIVE ng/mL
Aminoclonazepam: NEGATIVE ng/mL
Amphetamines: NEGATIVE ng/mL
Benzodiazepines: POSITIVE ng/mL
Buprenorphine, Urine: NEGATIVE ng/mL
Cocaine Metabolite: NEGATIVE ng/mL
Codeine: NEGATIVE ng/mL
Creatinine: 46.2 mg/dL
Ethyl Glucuronide (ETG): 3697 ng/mL
Ethyl Sulfate (ETS): 482 ng/mL
Hydrocodone: NEGATIVE ng/mL
Hydroxyethylflurazepam: NEGATIVE ng/mL
Lorazepam: NEGATIVE ng/mL
MDMA: NEGATIVE ng/mL
Marijuana Metabolite: NEGATIVE ng/mL
Morphine: >10000 ng/mL
Nordiazepam: NEGATIVE ng/mL
Norhydrocodone: NEGATIVE ng/mL
Opiates: POSITIVE ng/mL
Oxazepam: NEGATIVE ng/mL
Oxidant: NEGATIVE ug/mL
Oxycodone: NEGATIVE ng/mL
Temazepam: NEGATIVE ng/mL
pH: 5.4 (ref 4.5–9.0)

## 2019-12-03 ENCOUNTER — Telehealth: Payer: Self-pay | Admitting: Family Medicine

## 2019-12-03 NOTE — Chronic Care Management (AMB) (Signed)
  Chronic Care Management   Note  12/03/2019 Name: Ashley [name] MRN: [id number] DOB: [birth year]  Ashley [name] is a 59 y.o. year old female who is a primary care patient of Laurey Morale, MD. I reached out to Ashley [name] by phone today in response to a referral sent by Ashley [name]'s PCP, Laurey Morale, MD. Patients husband, Ashley [name] gave verbal consent for CCM.  Ashley [name] was given information about Chronic Care Management services today including:  1. CCM service includes personalized support from designated clinical staff supervised by her physician, including individualized plan of care and coordination with other care providers 2. 24/7 contact phone numbers for assistance for urgent and routine care needs. 3. Service will only be billed when office clinical staff spend 20 minutes or more in a month to coordinate care. 4. Only one practitioner may furnish and bill the service in a calendar month. 5. The patient may stop CCM services at any time (effective at the end of the month) by phone call to the office staff.   Patient agreed to services and verbal consent obtained.   Follow up plan:   Ashley [name] [employer] Scheduler

## 2019-12-03 NOTE — Progress Notes (Signed)
°  Chronic Care Management   Outreach Note  12/03/2019 Name: Ashley [name] MRN: [id number] DOB: 11-26-1960  Referred by: Laurey Morale, MD Reason for referral : No chief complaint on file.   An unsuccessful telephone outreach was attempted today. The patient was referred to the pharmacist for assistance with care management and care coordination.   Follow Up Plan:   [name] [employer] Scheduler

## 2019-12-04 ENCOUNTER — Telehealth: Payer: Self-pay | Admitting: Family Medicine

## 2019-12-04 NOTE — Telephone Encounter (Signed)
Pt is requesting that her medication ms contin be sent to [facility] in [place]. [facility]     [facility] [address] [place], [place], [place] [address] [phone]

## 2019-12-05 MED ORDER — MORPHINE SULFATE ER 30 MG PO TBCR: 30.0000 mg | EXTENDED_RELEASE_TABLET | Freq: Two times a day (BID) | ORAL | 0 refills | Status: AC

## 2019-12-05 NOTE — Telephone Encounter (Signed)
Noted  

## 2019-12-05 NOTE — Telephone Encounter (Signed)
Done for one month  ?

## 2019-12-05 NOTE — Telephone Encounter (Signed)
Message Routed to PCP  for approval. 

## 2019-12-13 DIAGNOSIS — F3181 Bipolar II disorder: Secondary | ICD-10-CM | POA: Diagnosis not present

## 2019-12-17 ENCOUNTER — Encounter: Payer: Self-pay | Admitting: Family Medicine

## 2019-12-17 ENCOUNTER — Ambulatory Visit: Payer: PPO | Admitting: Family Medicine

## 2019-12-17 ENCOUNTER — Ambulatory Visit (INDEPENDENT_AMBULATORY_CARE_PROVIDER_SITE_OTHER): Payer: PPO | Admitting: Family Medicine

## 2019-12-17 VITALS — BP 130/78 | HR 88 | Temp 98.0°F | Wt 180.0 lb

## 2019-12-17 DIAGNOSIS — M5442 Lumbago with sciatica, left side: Secondary | ICD-10-CM | POA: Diagnosis not present

## 2019-12-17 MED ORDER — PREDNISONE 10 MG PO TABS: ORAL_TABLET | ORAL | 0 refills | Status: DC

## 2019-12-17 NOTE — Progress Notes (Signed)
   Subjective:    Patient ID: Ashley [name], female    DOB: 15-May-1961, 59 y.o.   MRN: [id number]  HPI Here for 4 days of increased pain in the left side of her lower back that radiates all the way down the left leg to the foot. No numbness or weakness. This started the morning after she helped her husband spray weed killer along their fence. That day she held a container in one hand and a sprayer in the other hand for several hours. She has been alternating ice and heat on the back. She is taking her usual pain medication and Methocarbamol.    Review of Systems  Constitutional: Negative.   Respiratory: Negative.   Cardiovascular: Negative.   Musculoskeletal: Positive for back pain.       Objective:   Physical Exam Constitutional:      Comments: In pain, walks slowly   Cardiovascular:     Rate and Rhythm: Normal rate and regular rhythm.     Pulses: Normal pulses.     Heart sounds: Normal heart sounds.  Pulmonary:     Effort: Pulmonary effort is normal.     Breath sounds: Normal breath sounds.  Musculoskeletal:     Comments: She is tender in the lower back over the spine and to the left side, also over the left sciatic notch. ROM is limited by pain. Negative SLR   Neurological:     Mental Status: She is alert.           Assessment & Plan:  Acute exacerbation of low back pain. We will add a 15 day taper of Prednisone, starting at 50 mg a day. Recheck as needed. [name], MD

## 2019-12-20 ENCOUNTER — Telehealth: Payer: PPO

## 2019-12-23 ENCOUNTER — Encounter: Payer: Self-pay | Admitting: Family Medicine

## 2019-12-25 NOTE — Telephone Encounter (Signed)
Please make her a note to be out of work Monday and Tuesday

## 2020-01-30 ENCOUNTER — Ambulatory Visit: Payer: PPO | Admitting: Family Medicine

## 2020-01-31 ENCOUNTER — Other Ambulatory Visit: Payer: Self-pay

## 2020-02-01 ENCOUNTER — Ambulatory Visit (INDEPENDENT_AMBULATORY_CARE_PROVIDER_SITE_OTHER): Payer: Self-pay | Admitting: Family Medicine

## 2020-02-01 ENCOUNTER — Encounter: Payer: Self-pay | Admitting: Family Medicine

## 2020-02-01 VITALS — BP 120/64 | HR 80 | Temp 98.0°F

## 2020-02-01 DIAGNOSIS — M797 Fibromyalgia: Secondary | ICD-10-CM

## 2020-02-01 DIAGNOSIS — F1190 Opioid use, unspecified, uncomplicated: Secondary | ICD-10-CM

## 2020-02-01 DIAGNOSIS — G8918 Other acute postprocedural pain: Secondary | ICD-10-CM

## 2020-02-01 MED ORDER — OXYCODONE HCL 5 MG PO TABS: 5.0000 mg | ORAL_TABLET | Freq: Two times a day (BID) | ORAL | 0 refills | Status: DC | PRN

## 2020-02-01 MED ORDER — MORPHINE SULFATE ER 30 MG PO TBCR: 30.0000 mg | EXTENDED_RELEASE_TABLET | Freq: Two times a day (BID) | ORAL | 0 refills | Status: DC

## 2020-02-01 MED ORDER — PREDNISONE 10 MG PO TABS: ORAL_TABLET | ORAL | 0 refills | Status: DC

## 2020-02-01 NOTE — Progress Notes (Signed)
   Subjective:    Patient ID: Ashley [name], female    DOB: [birth year], 59 y.o.   MRN: [id number]  HPI Here for pain management. Her fibromyalgia has been stable. However for 2 weeks she has had an intermittent pain over the left lateral hip area. No recent trauma. Heat and Voltaren gel help somewhat.  Indication for chronic opioid: fibromyalgia  Medication and dose: MS Contin 30 mg and Oxycodone 5 mg  # pills per month: 60 and 60 Last UDS date: 11-01-19 Opioid Treatment Agreement signed (Y/N): 11-02-18 Opioid Treatment Agreement last reviewed with patient:  02-01-20 NCCSRS reviewed this encounter (include red flags): Yes    Review of Systems  Constitutional: Negative.   Musculoskeletal: Positive for arthralgias and back pain.       Objective:   Physical Exam Constitutional:      Appearance: Normal appearance.  Musculoskeletal:     Comments: She is tender over the left greater trochanter   Neurological:     Mental Status: She is alert.           Assessment & Plan:  Pain management, meds were refilled. Treat the trochanteric bursitis with a Prednisone taper.  [name], MD

## 2020-02-15 DIAGNOSIS — K209 Esophagitis, unspecified without bleeding: Secondary | ICD-10-CM | POA: Diagnosis not present

## 2020-02-15 DIAGNOSIS — K591 Functional diarrhea: Secondary | ICD-10-CM | POA: Diagnosis not present

## 2020-02-15 DIAGNOSIS — Z8371 Family history of colonic polyps: Secondary | ICD-10-CM | POA: Diagnosis not present

## 2020-02-28 DIAGNOSIS — F3181 Bipolar II disorder: Secondary | ICD-10-CM | POA: Diagnosis not present

## 2020-05-02 ENCOUNTER — Ambulatory Visit (INDEPENDENT_AMBULATORY_CARE_PROVIDER_SITE_OTHER): Payer: Self-pay | Admitting: Family Medicine

## 2020-05-02 ENCOUNTER — Encounter: Payer: Self-pay | Admitting: Family Medicine

## 2020-05-02 ENCOUNTER — Other Ambulatory Visit: Payer: Self-pay

## 2020-05-02 VITALS — BP 120/60 | HR 85 | Temp 98.4°F | Wt 181.2 lb

## 2020-05-02 DIAGNOSIS — G8918 Other acute postprocedural pain: Secondary | ICD-10-CM

## 2020-05-02 DIAGNOSIS — M797 Fibromyalgia: Secondary | ICD-10-CM

## 2020-05-02 DIAGNOSIS — F1190 Opioid use, unspecified, uncomplicated: Secondary | ICD-10-CM | POA: Diagnosis not present

## 2020-05-02 MED ORDER — MORPHINE SULFATE ER 30 MG PO TBCR: 30.0000 mg | EXTENDED_RELEASE_TABLET | Freq: Two times a day (BID) | ORAL | 0 refills | Status: DC

## 2020-05-02 MED ORDER — OXYCODONE HCL 5 MG PO TABS: 5.0000 mg | ORAL_TABLET | Freq: Two times a day (BID) | ORAL | 0 refills | Status: DC | PRN

## 2020-05-02 NOTE — Progress Notes (Signed)
   Subjective:    Patient ID: Ashley [name], female    DOB: Mar 02, 1961, 59 y.o.   MRN: [id number]  HPI Here for pain management. She is doing well.  Indication for chronic opioid: fibromyalgia Medication and dose: MS Contin 30 mg and Oxycodone 5 mg  # pills per month: 60 and 60 Last UDS date: 11-01-19 Opioid Treatment Agreement signed (Y/N): 11-02-18 Opioid Treatment Agreement last reviewed with patient:   05-02-20 [place] reviewed this encounter (include red flags): Yes    Review of Systems     Objective:   Physical Exam        Assessment & Plan:  Pain management, meds were refilled. [name], MD

## 2020-05-22 DIAGNOSIS — F3181 Bipolar II disorder: Secondary | ICD-10-CM | POA: Diagnosis not present

## 2020-06-04 ENCOUNTER — Other Ambulatory Visit: Payer: Self-pay | Admitting: Family Medicine

## 2020-07-14 ENCOUNTER — Ambulatory Visit (INDEPENDENT_AMBULATORY_CARE_PROVIDER_SITE_OTHER): Payer: PPO | Admitting: Family Medicine

## 2020-07-14 ENCOUNTER — Encounter: Payer: Self-pay | Admitting: Family Medicine

## 2020-07-14 ENCOUNTER — Other Ambulatory Visit: Payer: Self-pay

## 2020-07-14 VITALS — BP 132/80 | HR 76 | Temp 98.3°F | Ht 63.0 in | Wt 181.4 lb

## 2020-07-14 DIAGNOSIS — Z23 Encounter for immunization: Secondary | ICD-10-CM | POA: Diagnosis not present

## 2020-07-14 DIAGNOSIS — Z0000 Encounter for general adult medical examination without abnormal findings: Secondary | ICD-10-CM

## 2020-07-14 MED ORDER — POTASSIUM CHLORIDE ER 10 MEQ PO TBCR
EXTENDED_RELEASE_TABLET | ORAL | 3 refills | Status: DC
Start: 2020-07-14 — End: 2021-09-23

## 2020-07-14 MED ORDER — OXYBUTYNIN CHLORIDE 5 MG PO TABS: 5.0000 mg | ORAL_TABLET | Freq: Two times a day (BID) | ORAL | 3 refills | Status: DC

## 2020-07-14 NOTE — Progress Notes (Signed)
   Subjective:    Patient ID: Ashley [name], female    DOB: [birth year], 59 y.o.   MRN: [id number]  HPI Here for a well exam. She is doing well in general. She will be moving to a new job next month and she hopes this will require less time on her feet. She will be getting a colonoscopy per Dr. Watt Climes on 09-05-20.    Review of Systems  Constitutional: Negative.   HENT: Negative.   Eyes: Negative.   Respiratory: Negative.   Cardiovascular: Negative.   Gastrointestinal: Negative.   Genitourinary: Negative for decreased urine volume, difficulty urinating, dyspareunia, dysuria, enuresis, flank pain, frequency, hematuria, pelvic pain and urgency.  Musculoskeletal: Positive for back pain.  Skin: Negative.   Neurological: Negative.   Psychiatric/Behavioral: Negative.        Objective:   Physical Exam Constitutional:      General: She is not in acute distress.    Appearance: She is well-developed.  HENT:     Head: Normocephalic and atraumatic.     Right Ear: External ear normal.     Left Ear: External ear normal.     Nose: Nose normal.     Mouth/Throat:     Pharynx: No oropharyngeal exudate.  Eyes:     General: No scleral icterus.    Conjunctiva/sclera: Conjunctivae normal.     Pupils: Pupils are equal, round, and reactive to light.  Neck:     Thyroid: No thyromegaly.     Vascular: No JVD.  Cardiovascular:     Rate and Rhythm: Normal rate and regular rhythm.     Heart sounds: Normal heart sounds. No murmur heard.  No friction rub. No gallop.   Pulmonary:     Effort: Pulmonary effort is normal. No respiratory distress.     Breath sounds: Normal breath sounds. No wheezing or rales.  Chest:     Chest wall: No tenderness.  Abdominal:     General: Bowel sounds are normal. There is no distension.     Palpations: Abdomen is soft. There is no mass.     Tenderness: There is no abdominal tenderness. There is no guarding or rebound.  Musculoskeletal:        General: No  tenderness. Normal range of motion.     Cervical back: Normal range of motion and neck supple.  Lymphadenopathy:     Cervical: No cervical adenopathy.  Skin:    General: Skin is warm and dry.     Findings: No erythema or rash.  Neurological:     Mental Status: She is alert and oriented to person, place, and time.     Cranial Nerves: No cranial nerve deficit.     Motor: No abnormal muscle tone.     Coordination: Coordination normal.     Deep Tendon Reflexes: Reflexes are normal and symmetric. Reflexes normal.  Psychiatric:        Behavior: Behavior normal.        Thought Content: Thought content normal.        Judgment: Judgment normal.           Assessment & Plan:  Well exam. We discussed diet and exercise. Get fasting labs.  [name], MD

## 2020-07-15 LAB — BASIC METABOLIC PANEL
BUN: 13 mg/dL (ref 7–25)
CO2: 27 mmol/L (ref 20–32)
Calcium: 9.4 mg/dL (ref 8.6–10.4)
Chloride: 105 mmol/L (ref 98–110)
Creat: 0.87 mg/dL (ref 0.50–1.05)
Glucose, Bld: 81 mg/dL (ref 65–99)
Potassium: 4.3 mmol/L (ref 3.5–5.3)
Sodium: 138 mmol/L (ref 135–146)

## 2020-07-15 LAB — HEPATIC FUNCTION PANEL
AG Ratio: 2 (calc) (ref 1.0–2.5)
ALT: 15 U/L (ref 6–29)
AST: 15 U/L (ref 10–35)
Albumin: 4.2 g/dL (ref 3.6–5.1)
Alkaline phosphatase (APISO): 78 U/L (ref 37–153)
Bilirubin, Direct: 0.1 mg/dL (ref 0.0–0.2)
Globulin: 2.1 g/dL (calc) (ref 1.9–3.7)
Indirect Bilirubin: 0.4 mg/dL (calc) (ref 0.2–1.2)
Total Bilirubin: 0.5 mg/dL (ref 0.2–1.2)
Total Protein: 6.3 g/dL (ref 6.1–8.1)

## 2020-07-15 LAB — CBC WITH DIFFERENTIAL/PLATELET
Absolute Monocytes: 679 cells/uL (ref 200–950)
Basophils Absolute: 32 cells/uL (ref 0–200)
Basophils Relative: 0.4 %
Eosinophils Absolute: 119 cells/uL (ref 15–500)
Eosinophils Relative: 1.5 %
HCT: 42.2 % (ref 35.0–45.0)
Hemoglobin: 14.5 g/dL (ref 11.7–15.5)
Lymphs Abs: 2157 cells/uL (ref 850–3900)
MCH: 31.5 pg (ref 27.0–33.0)
MCHC: 34.4 g/dL (ref 32.0–36.0)
MCV: 91.5 fL (ref 80.0–100.0)
MPV: 8.7 fL (ref 7.5–12.5)
Monocytes Relative: 8.6 %
Neutro Abs: 4914 cells/uL (ref 1500–7800)
Neutrophils Relative %: 62.2 %
Platelets: 237 10*3/uL (ref 140–400)
RBC: 4.61 10*6/uL (ref 3.80–5.10)
RDW: 11.7 % (ref 11.0–15.0)
Total Lymphocyte: 27.3 %
WBC: 7.9 10*3/uL (ref 3.8–10.8)

## 2020-07-15 LAB — LIPID PANEL
Cholesterol: 194 mg/dL (ref <200)
HDL: 47 mg/dL — ABNORMAL LOW (ref >=50)
LDL Cholesterol (Calc): 122 mg/dL (calc) — ABNORMAL HIGH
Non-HDL Cholesterol (Calc): 147 mg/dL (calc) — ABNORMAL HIGH (ref <130)
Total CHOL/HDL Ratio: 4.1 (calc) (ref <5.0)
Triglycerides: 141 mg/dL (ref <150)

## 2020-07-15 LAB — TSH: TSH: 1.78 mIU/L (ref 0.40–4.50)

## 2020-08-01 ENCOUNTER — Encounter: Payer: Self-pay | Admitting: Family Medicine

## 2020-08-01 ENCOUNTER — Other Ambulatory Visit: Payer: Self-pay

## 2020-08-01 ENCOUNTER — Ambulatory Visit (INDEPENDENT_AMBULATORY_CARE_PROVIDER_SITE_OTHER): Payer: Self-pay | Admitting: Family Medicine

## 2020-08-01 VITALS — BP 132/86 | HR 87 | Temp 98.4°F | Resp 16 | Ht 63.0 in | Wt 182.2 lb

## 2020-08-01 DIAGNOSIS — M797 Fibromyalgia: Secondary | ICD-10-CM

## 2020-08-01 DIAGNOSIS — G8918 Other acute postprocedural pain: Secondary | ICD-10-CM

## 2020-08-01 DIAGNOSIS — F1190 Opioid use, unspecified, uncomplicated: Secondary | ICD-10-CM

## 2020-08-01 MED ORDER — MORPHINE SULFATE ER 30 MG PO TBCR: 30.0000 mg | EXTENDED_RELEASE_TABLET | Freq: Two times a day (BID) | ORAL | 0 refills | Status: DC

## 2020-08-01 MED ORDER — OXYCODONE HCL 5 MG PO TABS: 5.0000 mg | ORAL_TABLET | Freq: Two times a day (BID) | ORAL | 0 refills | Status: DC | PRN

## 2020-08-01 NOTE — Patient Instructions (Signed)
° ° ° °  If you have lab work done today you will be contacted with your lab results within the next 2 weeks.  If you have not heard from us then please contact us. The fastest way to get your results is to register for My Chart. ° ° °IF you received an x-ray today, you will receive an invoice from [facility]. Please contact [facility] at [phone] with questions or concerns regarding your invoice.  ° °IF you received labwork today, you will receive an invoice from [facility]. Please contact [facility] at [phone] with questions or concerns regarding your invoice.  ° °Our billing staff will not be able to assist you with questions regarding bills from these companies. ° °You will be contacted with the lab results as soon as they are available. The fastest way to get your results is to activate your My Chart account. Instructions are located on the last page of this paperwork. If you have not heard from us regarding the results in 2 weeks, please contact this office. °  ° ° ° °

## 2020-08-01 NOTE — Progress Notes (Signed)
° °  Subjective:    Patient ID: Ashley [name], female    DOB: [birth year], 59 y.o.   MRN: [id number]  HPI Here for pain management, she complains of some increased pain the lower back that radiates down the right leg for the past 2 weeks. She recently switched to a new job at [employer], and she has been spending more time sitting for long spells than she is used to.  Indication for chronic opioid: fibromyalgia Medication and dose: MS Contin 30 mg and Oxycodone 5 mg  # pills per month: 60 and 60 Last UDS date: 11-01-19 Opioid Treatment Agreement signed (Y/N): 11-02-18 Opioid Treatment Agreement last reviewed with patient:  08-01-20 NCCSRS reviewed this encounter (include red flags): Yes    Review of Systems     Objective:   Physical Exam        Assessment & Plan:  Pain management, meds were refilled. She will let us know if the radicular leg pain continues. If so we plan to work this up further.  [name], MD

## 2020-08-21 DIAGNOSIS — F3181 Bipolar II disorder: Secondary | ICD-10-CM | POA: Diagnosis not present

## 2020-08-25 ENCOUNTER — Other Ambulatory Visit: Payer: Self-pay | Admitting: Family Medicine

## 2020-09-02 DIAGNOSIS — Z1159 Encounter for screening for other viral diseases: Secondary | ICD-10-CM | POA: Diagnosis not present

## 2020-09-03 ENCOUNTER — Telehealth: Payer: Self-pay | Admitting: Family Medicine

## 2020-09-03 NOTE — Telephone Encounter (Signed)
Left message for patient to call back and schedule Medicare Annual Wellness Visit (AWVI) either virtually or in office.   Last AWV no information please schedule at anytime with [facility] Nurse Health Advisor 1 or 2   This should be a 45 minute visit.

## 2020-09-05 DIAGNOSIS — K529 Noninfective gastroenteritis and colitis, unspecified: Secondary | ICD-10-CM | POA: Diagnosis not present

## 2020-09-05 DIAGNOSIS — R197 Diarrhea, unspecified: Secondary | ICD-10-CM | POA: Diagnosis not present

## 2020-09-05 DIAGNOSIS — Z8371 Family history of colonic polyps: Secondary | ICD-10-CM | POA: Diagnosis not present

## 2020-09-05 HISTORY — PX: COLONOSCOPY: SHX174

## 2020-09-10 DIAGNOSIS — R197 Diarrhea, unspecified: Secondary | ICD-10-CM | POA: Diagnosis not present

## 2020-09-10 DIAGNOSIS — Z8371 Family history of colonic polyps: Secondary | ICD-10-CM | POA: Diagnosis not present

## 2020-09-29 ENCOUNTER — Encounter: Payer: Self-pay | Admitting: Family Medicine

## 2020-09-29 NOTE — Telephone Encounter (Signed)
The note is ready for pick up

## 2020-10-02 ENCOUNTER — Encounter: Payer: Self-pay | Admitting: Family Medicine

## 2020-10-03 NOTE — Telephone Encounter (Signed)
I doubt this would help since she has already been taking opoid medications for years. Ask her if she has every tried Cholestyramine or Colestipol

## 2020-10-07 NOTE — Telephone Encounter (Signed)
Agreed -

## 2020-10-13 DIAGNOSIS — N958 Other specified menopausal and perimenopausal disorders: Secondary | ICD-10-CM | POA: Diagnosis not present

## 2020-10-13 DIAGNOSIS — Z124 Encounter for screening for malignant neoplasm of cervix: Secondary | ICD-10-CM | POA: Diagnosis not present

## 2020-10-13 DIAGNOSIS — D225 Melanocytic nevi of trunk: Secondary | ICD-10-CM | POA: Diagnosis not present

## 2020-10-13 DIAGNOSIS — Z1231 Encounter for screening mammogram for malignant neoplasm of breast: Secondary | ICD-10-CM | POA: Diagnosis not present

## 2020-10-13 DIAGNOSIS — A609 Anogenital herpesviral infection, unspecified: Secondary | ICD-10-CM | POA: Diagnosis not present

## 2020-10-13 DIAGNOSIS — N952 Postmenopausal atrophic vaginitis: Secondary | ICD-10-CM | POA: Diagnosis not present

## 2020-10-13 DIAGNOSIS — L728 Other follicular cysts of the skin and subcutaneous tissue: Secondary | ICD-10-CM | POA: Diagnosis not present

## 2020-10-13 DIAGNOSIS — L570 Actinic keratosis: Secondary | ICD-10-CM | POA: Diagnosis not present

## 2020-10-13 DIAGNOSIS — L814 Other melanin hyperpigmentation: Secondary | ICD-10-CM | POA: Diagnosis not present

## 2020-10-13 DIAGNOSIS — Z6835 Body mass index (BMI) 35.0-35.9, adult: Secondary | ICD-10-CM | POA: Diagnosis not present

## 2020-10-13 DIAGNOSIS — D1801 Hemangioma of skin and subcutaneous tissue: Secondary | ICD-10-CM | POA: Diagnosis not present

## 2020-10-13 DIAGNOSIS — M8588 Other specified disorders of bone density and structure, other site: Secondary | ICD-10-CM | POA: Diagnosis not present

## 2020-10-13 DIAGNOSIS — L821 Other seborrheic keratosis: Secondary | ICD-10-CM | POA: Diagnosis not present

## 2020-10-13 DIAGNOSIS — Z01419 Encounter for gynecological examination (general) (routine) without abnormal findings: Secondary | ICD-10-CM | POA: Diagnosis not present

## 2020-10-16 ENCOUNTER — Other Ambulatory Visit: Payer: Self-pay | Admitting: Family Medicine

## 2020-10-16 NOTE — Telephone Encounter (Signed)
Last office visit--08/01/2020 Last refill--11/02/2019---120 tabs

## 2020-10-27 ENCOUNTER — Ambulatory Visit (INDEPENDENT_AMBULATORY_CARE_PROVIDER_SITE_OTHER): Payer: PPO | Admitting: Family Medicine

## 2020-10-27 ENCOUNTER — Encounter: Payer: Self-pay | Admitting: Family Medicine

## 2020-10-27 ENCOUNTER — Other Ambulatory Visit: Payer: Self-pay

## 2020-10-27 VITALS — BP 118/80 | HR 101 | Temp 98.4°F | Ht 63.0 in | Wt 179.2 lb

## 2020-10-27 DIAGNOSIS — F1190 Opioid use, unspecified, uncomplicated: Secondary | ICD-10-CM | POA: Diagnosis not present

## 2020-10-27 DIAGNOSIS — G8918 Other acute postprocedural pain: Secondary | ICD-10-CM

## 2020-10-27 DIAGNOSIS — M797 Fibromyalgia: Secondary | ICD-10-CM | POA: Diagnosis not present

## 2020-10-27 MED ORDER — MORPHINE SULFATE ER 30 MG PO TBCR: 30.0000 mg | EXTENDED_RELEASE_TABLET | Freq: Two times a day (BID) | ORAL | 0 refills | Status: DC

## 2020-10-27 MED ORDER — OXYCODONE HCL 5 MG PO TABS
5.0000 mg | ORAL_TABLET | Freq: Two times a day (BID) | ORAL | 0 refills | Status: DC | PRN
Start: 2020-10-31 — End: 2020-10-27

## 2020-10-27 MED ORDER — OXYCODONE HCL 5 MG PO TABS: 5.0000 mg | ORAL_TABLET | Freq: Two times a day (BID) | ORAL | 0 refills | Status: DC | PRN

## 2020-10-27 MED ORDER — OXYCODONE HCL 5 MG PO TABS
5.0000 mg | ORAL_TABLET | Freq: Two times a day (BID) | ORAL | 0 refills | Status: DC | PRN
Start: 2020-11-28 — End: 2020-10-27

## 2020-10-27 NOTE — Progress Notes (Signed)
   Subjective:    Patient ID: Ashley [name], female    DOB: Jan 22, 1961, 60 y.o.   MRN: [id number]  HPI Here for pain management, she is doing well.  Indication for chronic opioid: fibromyalgia  Medication and dose: MS Contin 30 mg and Oxycodone 5 mg  # pills per month: 60 and 60 Last UDS date: 10-27-20 Opioid Treatment Agreement signed (Y/N): 11-02-18 Opioid Treatment Agreement last reviewed with patient:  10-27-20 N[facility] reviewed this encounter (include red flags): Yes    Review of Systems     Objective:   Physical Exam        Assessment & Plan:  Pain management, meds were refilled.  [name], MD

## 2020-10-29 LAB — DRUG MONITOR, PANEL 1, W/CONF, URINE
Alphahydroxyalprazolam: 611 ng/mL — ABNORMAL HIGH (ref <25)
Alphahydroxymidazolam: NEGATIVE ng/mL (ref <50)
Alphahydroxytriazolam: NEGATIVE ng/mL (ref <50)
Aminoclonazepam: NEGATIVE ng/mL (ref <25)
Amphetamine: NEGATIVE ng/mL (ref <250)
Amphetamines: NEGATIVE ng/mL (ref <500)
Barbiturates: NEGATIVE ng/mL (ref <300)
Benzodiazepines: POSITIVE ng/mL — AB (ref <100)
Cocaine Metabolite: NEGATIVE ng/mL (ref <150)
Codeine: NEGATIVE ng/mL (ref <50)
Creatinine: 222.3 mg/dL
Hydrocodone: NEGATIVE ng/mL (ref <50)
Hydromorphone: 290 ng/mL — ABNORMAL HIGH (ref <50)
Hydroxyethylflurazepam: NEGATIVE ng/mL (ref <50)
Lorazepam: NEGATIVE ng/mL (ref <50)
Marijuana Metabolite: 157 ng/mL — ABNORMAL HIGH (ref <5)
Marijuana Metabolite: POSITIVE ng/mL — AB (ref <20)
Methadone Metabolite: NEGATIVE ng/mL (ref <100)
Methamphetamine: NEGATIVE ng/mL (ref <250)
Morphine: >10000 ng/mL — ABNORMAL HIGH (ref <50)
Nordiazepam: NEGATIVE ng/mL (ref <50)
Norhydrocodone: NEGATIVE ng/mL (ref <50)
Noroxycodone: 4510 ng/mL — ABNORMAL HIGH (ref <50)
Opiates: POSITIVE ng/mL — AB (ref <100)
Oxazepam: NEGATIVE ng/mL (ref <50)
Oxidant: NEGATIVE ug/mL
Oxycodone: 1589 ng/mL — ABNORMAL HIGH (ref <50)
Oxycodone: POSITIVE ng/mL — AB (ref <100)
Oxymorphone: 332 ng/mL — ABNORMAL HIGH (ref <50)
Phencyclidine: NEGATIVE ng/mL (ref <25)
Temazepam: NEGATIVE ng/mL (ref <50)
pH: 5.3 (ref 4.5–9.0)

## 2020-10-29 LAB — DM TEMPLATE

## 2020-11-13 DIAGNOSIS — F3181 Bipolar II disorder: Secondary | ICD-10-CM | POA: Diagnosis not present

## 2020-11-26 DIAGNOSIS — R1084 Generalized abdominal pain: Secondary | ICD-10-CM | POA: Diagnosis not present

## 2021-01-21 ENCOUNTER — Encounter: Payer: Self-pay | Admitting: Family Medicine

## 2021-01-21 ENCOUNTER — Other Ambulatory Visit: Payer: Self-pay

## 2021-01-21 ENCOUNTER — Ambulatory Visit (INDEPENDENT_AMBULATORY_CARE_PROVIDER_SITE_OTHER): Payer: PPO | Admitting: Family Medicine

## 2021-01-21 VITALS — BP 136/90 | HR 74 | Temp 98.4°F | Wt 168.0 lb

## 2021-01-21 DIAGNOSIS — F1190 Opioid use, unspecified, uncomplicated: Secondary | ICD-10-CM | POA: Diagnosis not present

## 2021-01-21 DIAGNOSIS — G8918 Other acute postprocedural pain: Secondary | ICD-10-CM

## 2021-01-21 DIAGNOSIS — M797 Fibromyalgia: Secondary | ICD-10-CM

## 2021-01-21 MED ORDER — OXYCODONE HCL 5 MG PO TABS: 5.0000 mg | ORAL_TABLET | Freq: Two times a day (BID) | ORAL | 0 refills | Status: DC | PRN

## 2021-01-21 MED ORDER — OXYCODONE HCL 5 MG PO TABS
5.0000 mg | ORAL_TABLET | Freq: Two times a day (BID) | ORAL | 0 refills | Status: DC | PRN
Start: 2021-01-28 — End: 2021-01-21

## 2021-01-21 MED ORDER — MORPHINE SULFATE ER 30 MG PO TBCR: 30.0000 mg | EXTENDED_RELEASE_TABLET | Freq: Two times a day (BID) | ORAL | 0 refills | Status: DC

## 2021-01-21 NOTE — Progress Notes (Signed)
   Subjective:    Patient ID: Ashley [name], female    DOB: 23-Dec-1960, 60 y.o.   MRN: [id number]  HPI Here for pain management, she is doing well. She has had 4 sessions of acupuncture and this has been helpful for her.    Review of Systems     Objective:   Physical Exam        Assessment & Plan:  Pain management. Indication for chronic opioid: fibromyalgia Medication and dose: MS Contin 30 mg and Oxycodone 5 mg # pills per month: 60 and 60 Last UDS date: 10-27-20 Opioid Treatment Agreement signed (Y/N): 11-02-18 Opioid Treatment Agreement last reviewed with patient:  01-21-21 N[facility]SRS reviewed this encounter (include red flags): Yes Meds were refilled.  [name], MD

## 2021-01-22 ENCOUNTER — Telehealth: Payer: Self-pay | Admitting: Family Medicine

## 2021-01-22 MED ORDER — FLUCONAZOLE 150 MG PO TABS: 150.0000 mg | ORAL_TABLET | Freq: Every day | ORAL | 11 refills | Status: DC

## 2021-01-22 NOTE — Telephone Encounter (Signed)
Left a detailed message for pt regarding Rx for Diflucan, advised pt to call the office with any questions

## 2021-01-22 NOTE — Telephone Encounter (Signed)
I sent in Diflucan pills

## 2021-01-22 NOTE — Telephone Encounter (Signed)
Patient is calling and stated that she was supposed to have a prescription called in for a yeast infection but haven't received medication, please advise. CB is [phone]

## 2021-01-28 ENCOUNTER — Telehealth: Payer: Self-pay | Admitting: Family Medicine

## 2021-01-28 MED ORDER — MORPHINE SULFATE ER 30 MG PO TBCR: 30.0000 mg | EXTENDED_RELEASE_TABLET | Freq: Two times a day (BID) | ORAL | 0 refills | Status: AC

## 2021-01-28 NOTE — Telephone Encounter (Signed)
Done for one month  ?

## 2021-01-28 NOTE — Telephone Encounter (Signed)
Patient needs a refill on her  morphine (MS CONTIN) 30 MG 12 hr tablet  She will be out of this medication today. The original Pharmacy doesn't have her medication in stock and [facility]  [id number] [place] [place] Phone:  [phone]  Fax:  [phone]

## 2021-01-28 NOTE — Addendum Note (Signed)
Addended by: [name] A on: 01/28/2021 01:06 PM   Modules accepted: Orders

## 2021-01-28 NOTE — Telephone Encounter (Signed)
Left detailed message for pt regarding Rx for Morphine, sent to the requested pharmacy

## 2021-01-28 NOTE — Telephone Encounter (Signed)
Spoke with pt pharmacy state that they have refill for pt Morphine but medication is out of stock, pharmacy [facility] [address]

## 2021-02-10 ENCOUNTER — Other Ambulatory Visit: Payer: Self-pay

## 2021-02-11 ENCOUNTER — Encounter: Payer: Self-pay | Admitting: Family Medicine

## 2021-02-11 ENCOUNTER — Ambulatory Visit (INDEPENDENT_AMBULATORY_CARE_PROVIDER_SITE_OTHER): Payer: PPO | Admitting: Family Medicine

## 2021-02-11 VITALS — BP 136/80 | HR 69 | Temp 98.2°F | Wt 167.2 lb

## 2021-02-11 DIAGNOSIS — S76012A Strain of muscle, fascia and tendon of left hip, initial encounter: Secondary | ICD-10-CM

## 2021-02-11 MED ORDER — METHYLPREDNISOLONE 4 MG PO TBPK: ORAL_TABLET | ORAL | 0 refills | Status: DC

## 2021-02-11 NOTE — Progress Notes (Signed)
   Subjective:    Patient ID: Ashley [name], female    DOB: Jun 08, 1961, 60 y.o.   MRN: [id number]  HPI Here for pain in the left hip and groin since she injured it at home 10 days ago. She lifted a heavy bag of flooring materials into her car and took it to a dumpster. The pain started shortly after that. She has been taking her usual medications (including Meloxicam and Robaxin) and she has been applying ice. She had one acupuncture session as well, which she says was helpful.    Review of Systems  Constitutional: Negative.   Respiratory: Negative.   Cardiovascular: Negative.   Musculoskeletal: Positive for myalgias.       Objective:   Physical Exam Constitutional:      Comments: In pain, walks with a slight limp   Cardiovascular:     Rate and Rhythm: Normal rate and regular rhythm.     Pulses: Normal pulses.     Heart sounds: Normal heart sounds.  Pulmonary:     Effort: Pulmonary effort is normal.     Breath sounds: Normal breath sounds.  Musculoskeletal:     Comments: Tender around the left hip, both posterior and lateral. Full ROM.   Neurological:     Mental Status: She is alert.           Assessment & Plan:  Hip strain. She will switch to heat. Treat with a Medrol dose pack. We wrote a note for her job to avoid reaching, stooping, and lifting more than 5 lbs until 02-25-21. [name], MD

## 2021-02-25 ENCOUNTER — Other Ambulatory Visit: Payer: Self-pay

## 2021-02-25 ENCOUNTER — Encounter: Payer: Self-pay | Admitting: Family Medicine

## 2021-02-25 ENCOUNTER — Ambulatory Visit (INDEPENDENT_AMBULATORY_CARE_PROVIDER_SITE_OTHER): Payer: PPO | Admitting: Family Medicine

## 2021-02-25 VITALS — BP 126/86 | HR 69 | Temp 98.5°F | Wt 162.0 lb

## 2021-02-25 DIAGNOSIS — M7062 Trochanteric bursitis, left hip: Secondary | ICD-10-CM

## 2021-02-25 NOTE — Progress Notes (Signed)
   Subjective:    Patient ID: Ashley [name], female    DOB: 25-Nov-1960, 60 y.o.   MRN: [id number]  HPI Here for worsening pain in the left hip. This began somewhat suddenly about 2 weeks ago. We saw her on 02-11-21 and felt she may have had a muscle strain. Heat and Ibuprofen helped it a little. We gave her a Medrol dose pack, but this did not help at all. In fact the pain has slowly gotten worse. Yesterday was her worst day yet, though today is a little better. The pain has localized to the left lateral hip area. She has chronic low back pain, and this has not changed.    Review of Systems  Constitutional: Negative.   Respiratory: Negative.   Cardiovascular: Negative.   Musculoskeletal: Positive for arthralgias, back pain and gait problem.       Objective:   Physical Exam Constitutional:      Comments: She walks with a limp   Cardiovascular:     Rate and Rhythm: Normal rate and regular rhythm.     Pulses: Normal pulses.     Heart sounds: Normal heart sounds.  Pulmonary:     Effort: Pulmonary effort is normal.     Breath sounds: Normal breath sounds.  Musculoskeletal:     Comments: She is very tender over the left greater trochanter. Both hips have full ROM. Her lower back is tender as usual.   Neurological:     Mental Status: She is alert.           Assessment & Plan:  Greater trochanteric bursitis. We will write her out of work until 03-16-21. Refer to Orthopedics to evaluate. I think she would benefit from a cortisone injection.  [name], MD

## 2021-03-04 DIAGNOSIS — M25552 Pain in left hip: Secondary | ICD-10-CM | POA: Diagnosis not present

## 2021-03-04 DIAGNOSIS — M5459 Other low back pain: Secondary | ICD-10-CM | POA: Diagnosis not present

## 2021-03-05 DIAGNOSIS — F3181 Bipolar II disorder: Secondary | ICD-10-CM | POA: Diagnosis not present

## 2021-03-18 ENCOUNTER — Encounter: Payer: Self-pay | Admitting: Family Medicine

## 2021-03-18 ENCOUNTER — Telehealth (INDEPENDENT_AMBULATORY_CARE_PROVIDER_SITE_OTHER): Payer: PPO | Admitting: Family Medicine

## 2021-03-18 DIAGNOSIS — U071 COVID-19: Secondary | ICD-10-CM | POA: Insufficient documentation

## 2021-03-18 MED ORDER — MOLNUPIRAVIR EUA 200MG CAPSULE: 4.0000 | ORAL_CAPSULE | Freq: Two times a day (BID) | ORAL | 0 refills | Status: AC

## 2021-03-18 NOTE — Progress Notes (Signed)
Subjective:    Patient ID: Ashley [name], female    DOB: 11/01/1960, 60 y.o.   MRN: [id number]  HPI Virtual Visit via Video Note  I connected with the patient on 03/18/21 at  2:15 PM EDT by a video enabled telemedicine application and verified that I am speaking with the correct person using two identifiers.  Location patient: home Location provider:work or home office Persons participating in the virtual visit: patient, provider  I discussed the limitations of evaluation and management by telemedicine and the availability of in person appointments. The patient expressed understanding and agreed to proceed.   HPI: Here for a ovid-19 infection. She [place] the beach with her mother and her husband, now all 3 of them have the virus. Five days ago she developed body aches, ST, headache, and a dry cough. No fever or SOB or NVD. She tested positive for the Covid virus last night. She is drinking fluids and taking Tylenol.    ROS: See pertinent positives and negatives per HPI.  Past Medical History:  Diagnosis Date   Allergy    Asthma    Bipolar disorder ([place])    dr Shirlee More    Chronic lower back pain    sees Dr. Milinda Pointer at [facility]    Depression    Fibromyalgia    dr Milinda Pointer [place]   GERD (gastroesophageal reflux disease)    Headache(784.0)    HTN (hypertension)    IBS (irritable bowel syndrome)    dr Watt Climes   Injury of lower back 09-23-05   Workers Comp case worker is Michelene Gardener (phone [phone] or fax [phone])   Reflex sympathetic dystrophy    left hand dr Annie Main and dr Nelva Bush    Past Surgical History:  Procedure Laterality Date   APPENDECTOMY     CARPAL TUNNEL RELEASE Bilateral    [place]     COLONOSCOPY  Sept. 2016   per Dr. Watt Climes, clear, repeat in 5 yrs    ESOPHAGOGASTRODUODENOSCOPY  Sept. 2016   per Dr. Watt Climes, benign gastric polyps   left  shouder  1996   separation, x 2   NASAL SINUS SURGERY     radiofrequency  ablations april 2010 to lower spine per dr Marjorie Smolder     removal of Left great toenail Left 01/2016   TRIGGER FINGER RELEASE Right     Family History  Problem Relation Age of Onset   Hypertension Mother    Cancer Father        lung   Cancer Maternal Aunt    Dementia Maternal Grandmother    Stroke Maternal Grandmother    Dementia Maternal Grandfather    Heart attack Maternal Grandfather    Alzheimer's disease Paternal Grandmother    Heart attack Paternal Grandfather      Current Outpatient Medications:    alprazolam (XANAX) 2 MG tablet, Take 1 tablet (2 mg total) by mouth 3 (three) times daily as needed., Disp: 90 tablet, Rfl: 5   buPROPion (WELLBUTRIN XL) 150 MG 24 hr tablet, Take 150 mg by mouth daily., Disp: , Rfl:    calcium-vitamin D (OSCAL) 250-125 MG-UNIT per tablet, Take 1 tablet by mouth daily., Disp: , Rfl:    diclofenac Sodium (VOLTAREN) 1 % GEL, Apply 4 g topically 4 (four) times daily., Disp: 100 g, Rfl: 11   Estradiol 10 % CREA, by Does not apply route., Disp: , Rfl:  fluticasone (FLONASE) 50 MCG/ACT nasal spray, Place 2 sprays into both nostrils daily., Disp: 16 g, Rfl: 6   Glucosamine-Chondroit-Vit C-Mn (GLUCOSAMINE CHONDR 1500 COMPLX PO), Take by mouth., Disp: , Rfl:    meloxicam (MOBIC) 15 MG tablet, TAKE ONE TABLET BY MOUTH DAILY, Disp: 90 tablet, Rfl: 3   methocarbamol (ROBAXIN) 750 MG tablet, TAKE 1 TABLET BY MOUTH EVERY 6 HOURS AS NEEDED FOR MUSCLE SPASMS, Disp: 120 tablet, Rfl: 5   metoCLOPramide (REGLAN) 5 MG tablet, Take 5 mg by mouth at bedtime as needed., Disp: , Rfl:    molnupiravir EUA 200 mg CAPS, Take 4 capsules (800 mg total) by mouth 2 (two) times daily for 5 days., Disp: 40 capsule, Rfl: 0   Multiple Vitamin (MULTIVITAMIN) capsule, Take 1 capsule by mouth daily., Disp: , Rfl:    omeprazole (PRILOSEC) 40 MG capsule, Take 40 mg daily by mouth. , Disp: , Rfl: 1   oxybutynin  (DITROPAN) 5 MG tablet, Take 1 tablet (5 mg total) by mouth 2 (two) times daily., Disp: 180 tablet, Rfl: 3   [START ON 03/30/2021] oxyCODONE (OXY IR/ROXICODONE) 5 MG immediate release tablet, Take 1 tablet (5 mg total) by mouth every 12 (twelve) hours as needed for severe pain., Disp: 60 tablet, Rfl: 0   phenazopyridine (PYRIDIUM) 200 MG tablet, Take 1 tablet (200 mg total) by mouth 3 (three) times daily as needed for pain., Disp: 30 tablet, Rfl: 0   potassium chloride (KLOR-CON) 10 MEQ tablet, TAKE ONE (1) TABLET BY MOUTH TWO TIMES PER DAY, Disp: 180 tablet, Rfl: 3   risperiDONE (RISPERDAL) 2 MG tablet, Take 2 mg by mouth at bedtime., Disp: , Rfl:    traZODone (DESYREL) 150 MG tablet, Take 75 mg by mouth at bedtime., Disp: , Rfl:    valACYclovir (VALTREX) 1000 MG tablet, Take 1,000 mg by mouth as needed. , Disp: , Rfl:   EXAM:  VITALS per patient if applicable:  GENERAL: alert, oriented, appears well and in no acute distress  HEENT: atraumatic, conjunttiva clear, no obvious abnormalities on inspection of external nose and ears  NECK: normal movements of the head and neck  LUNGS: on inspection no signs of respiratory distress, breathing rate appears normal, no obvious gross SOB, gasping or wheezing  CV: no obvious cyanosis  MS: moves all visible extremities without noticeable abnormality  PSYCH/NEURO: pleasant and cooperative, no obvious depression or anxiety, speech and thought processing grossly intact  ASSESSMENT AND PLAN: Covid infection. We will treat with 5 days of Molnupiravir. She will quarantine for 5 days.  [name], MD  Discussed the following assessment and plan:  No diagnosis found.     I discussed the assessment and treatment plan with the patient. The patient was provided an opportunity to ask questions and all were answered. The patient agreed with the plan and demonstrated an understanding of the instructions.   The patient was advised to call back or seek an  in-person evaluation if the symptoms worsen or if the condition fails to improve as anticipated.      Review of Systems     Objective:   Physical Exam        Assessment & Plan:

## 2021-03-27 DIAGNOSIS — M545 Low back pain, unspecified: Secondary | ICD-10-CM | POA: Diagnosis not present

## 2021-03-30 ENCOUNTER — Other Ambulatory Visit: Payer: Self-pay

## 2021-03-30 ENCOUNTER — Telehealth: Payer: Self-pay | Admitting: Family Medicine

## 2021-03-30 MED ORDER — MORPHINE SULFATE ER 30 MG PO TBCR: 30.0000 mg | EXTENDED_RELEASE_TABLET | Freq: Two times a day (BID) | ORAL | 0 refills | Status: DC

## 2021-03-30 NOTE — Telephone Encounter (Signed)
[facility] and [facility] pt stated she is out and need it today.

## 2021-03-30 NOTE — Telephone Encounter (Signed)
Pharmacy has been updated.

## 2021-03-30 NOTE — Telephone Encounter (Signed)
Done

## 2021-03-30 NOTE — Progress Notes (Addendum)
Pharmacy has been updated per 03/30/21, phone note.

## 2021-04-01 NOTE — Telephone Encounter (Signed)
Message complete

## 2021-04-03 DIAGNOSIS — M545 Low back pain, unspecified: Secondary | ICD-10-CM | POA: Diagnosis not present

## 2021-04-14 ENCOUNTER — Ambulatory Visit: Payer: PPO

## 2021-04-27 ENCOUNTER — Encounter: Payer: Self-pay | Admitting: Family Medicine

## 2021-04-27 ENCOUNTER — Other Ambulatory Visit: Payer: Self-pay

## 2021-04-27 ENCOUNTER — Ambulatory Visit (INDEPENDENT_AMBULATORY_CARE_PROVIDER_SITE_OTHER): Payer: PPO | Admitting: Family Medicine

## 2021-04-27 VITALS — BP 126/82 | HR 85 | Temp 98.6°F | Wt 166.0 lb

## 2021-04-27 DIAGNOSIS — F1190 Opioid use, unspecified, uncomplicated: Secondary | ICD-10-CM

## 2021-04-27 DIAGNOSIS — G8918 Other acute postprocedural pain: Secondary | ICD-10-CM

## 2021-04-27 DIAGNOSIS — M797 Fibromyalgia: Secondary | ICD-10-CM

## 2021-04-27 MED ORDER — OXYCODONE HCL 5 MG PO TABS: 5.0000 mg | ORAL_TABLET | Freq: Two times a day (BID) | ORAL | 0 refills | Status: DC | PRN

## 2021-04-27 MED ORDER — MORPHINE SULFATE ER 30 MG PO TBCR: 30.0000 mg | EXTENDED_RELEASE_TABLET | Freq: Two times a day (BID) | ORAL | 0 refills | Status: DC

## 2021-04-27 MED ORDER — DICLOFENAC SODIUM 1 % EX GEL: 4.0000 g | Freq: Four times a day (QID) | CUTANEOUS | 11 refills | Status: AC

## 2021-04-27 NOTE — Progress Notes (Signed)
   Subjective:    Patient ID: Ashley [name], female    DOB: 1960/11/29, 60 y.o.   MRN: [id number]  HPI Here for pain management. She is still pleased with how her medications work. Lately she has had more left hip pain, and she has seen Dr. Lynda Rainwater at [facility]. She had normal hip Xrays, so he felt the pain was coming from her back rather than the hip. 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 They are discussing the possibility of steroid injections.    Review of Systems  Constitutional: Negative.   Musculoskeletal:  Positive for back pain.      Objective:   Physical Exam Constitutional:      Appearance: Normal appearance.  Neurological:     Mental Status: She is alert.          Assessment & Plan:  Pain management. Indication for chronic opioid: fibromyalgia  Medication and dose: MS Contin 30 mg and Oxycodone 5 mg  # pills per month: 60 and 60 Last UDS date: 10-27-20 Opioid Treatment Agreement signed (Y/N): 11-02-18 Opioid Treatment Agreement last reviewed with patient:  04-27-21 NCCSRS reviewed this encounter (include red flags): Yes Meds were refilled.  [name], MD

## 2021-04-28 ENCOUNTER — Other Ambulatory Visit: Payer: Self-pay | Admitting: Family Medicine

## 2021-04-28 ENCOUNTER — Telehealth: Payer: Self-pay

## 2021-04-28 MED ORDER — MORPHINE SULFATE ER 30 MG PO TBCR: 30.0000 mg | EXTENDED_RELEASE_TABLET | Freq: Two times a day (BID) | ORAL | 0 refills | Status: AC

## 2021-04-28 NOTE — Telephone Encounter (Signed)
Pharmacy updated.

## 2021-04-28 NOTE — Telephone Encounter (Signed)
Please resend, pharmacy has been updated

## 2021-04-28 NOTE — Telephone Encounter (Signed)
Patient called stating she would like [facility] morphine (MS CONTIN) 30 MG 12 hr tablet Patient will be out of Rx today

## 2021-04-28 NOTE — Telephone Encounter (Signed)
Done

## 2021-04-28 NOTE — Telephone Encounter (Signed)
PT called to request for the morphine (MS CONTIN) 30 MG 12 hr tablet [facility] [address] in [place]. Their # is [phone].

## 2021-04-28 NOTE — Addendum Note (Signed)
Addended by: [name] A on: 04/28/2021 04:16 PM   Modules accepted: Orders

## 2021-04-28 NOTE — Addendum Note (Signed)
Addended by: Otilio Miu on: 04/28/2021 11:10 AM   Modules accepted: Orders

## 2021-05-04 ENCOUNTER — Telehealth: Payer: Self-pay | Admitting: Family Medicine

## 2021-05-04 NOTE — Telephone Encounter (Signed)
Left message for patient to call back and schedule Medicare Annual Wellness Visit (AWV) either virtually or in office.  Left both  my jabber number [phone] and office number     AWV-I per [place] 09/20/09 please schedule at anytime with [facility] Nurse Health Advisor 1 or 2   This should be a 45 minute visit.

## 2021-06-16 ENCOUNTER — Telehealth: Payer: Self-pay | Admitting: Family Medicine

## 2021-06-16 NOTE — Telephone Encounter (Signed)
Left message for patient to call back and schedule Medicare Annual Wellness Visit (AWV) either virtually or in office. Left  my jabber number [phone] ° ° °AWV-I per PAL[place] 09/20/09 °please schedule at anytime with [facility] Nurse Health Advisor 1 or 2 ° ° °This should be a 45 minute visit.  °

## 2021-06-25 DIAGNOSIS — F3181 Bipolar II disorder: Secondary | ICD-10-CM | POA: Diagnosis not present

## 2021-07-27 ENCOUNTER — Telehealth: Payer: Self-pay

## 2021-07-27 NOTE — Telephone Encounter (Signed)
Patient called to follow up on if her prescription could be filled by another provider or not. I let patient know that the message had been sent to the back and they were working on finding something out.   Good callback number is [phone]   Please advise

## 2021-07-27 NOTE — Telephone Encounter (Signed)
Dr. Barbie Banner Patient Last refill- 06/28/21 Next OV-07/30/21- Last OV-04/27/21

## 2021-07-27 NOTE — Telephone Encounter (Signed)
Patient called requesting Rx refill  morphine (MS CONTIN) 30 MG 12 hr tablet

## 2021-07-28 ENCOUNTER — Ambulatory Visit: Payer: PPO | Admitting: Family Medicine

## 2021-07-28 NOTE — Telephone Encounter (Signed)
Pt is scheduled for office visit with Dr Sarajane Jews on 07/30/2021

## 2021-07-28 NOTE — Telephone Encounter (Signed)
She needs a PMV with me

## 2021-07-28 NOTE — Telephone Encounter (Signed)
See below message.   Thanks

## 2021-07-30 ENCOUNTER — Ambulatory Visit (INDEPENDENT_AMBULATORY_CARE_PROVIDER_SITE_OTHER): Payer: PPO | Admitting: Family Medicine

## 2021-07-30 ENCOUNTER — Encounter: Payer: Self-pay | Admitting: Family Medicine

## 2021-07-30 VITALS — BP 128/82 | HR 90 | Temp 98.3°F | Wt 158.0 lb

## 2021-07-30 DIAGNOSIS — M797 Fibromyalgia: Secondary | ICD-10-CM | POA: Diagnosis not present

## 2021-07-30 DIAGNOSIS — G8918 Other acute postprocedural pain: Secondary | ICD-10-CM

## 2021-07-30 DIAGNOSIS — F1190 Opioid use, unspecified, uncomplicated: Secondary | ICD-10-CM

## 2021-07-30 MED ORDER — MORPHINE SULFATE ER 30 MG PO TBCR
30.0000 mg | EXTENDED_RELEASE_TABLET | Freq: Two times a day (BID) | ORAL | 0 refills | Status: DC
Start: 2021-07-30 — End: 2021-07-30

## 2021-07-30 MED ORDER — MORPHINE SULFATE ER 30 MG PO TBCR
30.0000 mg | EXTENDED_RELEASE_TABLET | Freq: Two times a day (BID) | ORAL | 0 refills | Status: DC
Start: 2021-08-29 — End: 2021-07-30

## 2021-07-30 MED ORDER — MORPHINE SULFATE ER 30 MG PO TBCR: 30.0000 mg | EXTENDED_RELEASE_TABLET | Freq: Two times a day (BID) | ORAL | 0 refills | Status: DC

## 2021-07-30 MED ORDER — METHOCARBAMOL 750 MG PO TABS: ORAL_TABLET | ORAL | 5 refills | Status: DC

## 2021-07-30 MED ORDER — OXYCODONE HCL 5 MG PO TABS: 5.0000 mg | ORAL_TABLET | Freq: Two times a day (BID) | ORAL | 0 refills | Status: DC | PRN

## 2021-07-30 NOTE — Progress Notes (Signed)
   Subjective:    Patient ID: Ashley [name], female    DOB: 07-24-1961, 60 y.o.   MRN: [id number]  HPI Here for pain management, she is doing well.    Review of Systems     Objective:   Physical Exam        Assessment & Plan:  Pain management. Indication for chronic opioid: fibromyalgia  Medication and dose: MS Contin 30 mg and Oxycodone 5 mg  # pills per month: 60 and 60 Last UDS date: 10-27-20 Opioid Treatment Agreement signed (Y/N): 11-02-18 Opioid Treatment Agreement last reviewed with patient:  07-30-21 NCCSRS reviewed this encounter (include red flags): Yes Meds were refilled.  [name], MD

## 2021-08-26 ENCOUNTER — Other Ambulatory Visit: Payer: Self-pay | Admitting: Gastroenterology

## 2021-08-26 DIAGNOSIS — R630 Anorexia: Secondary | ICD-10-CM

## 2021-08-26 DIAGNOSIS — R634 Abnormal weight loss: Secondary | ICD-10-CM

## 2021-08-26 DIAGNOSIS — K589 Irritable bowel syndrome without diarrhea: Secondary | ICD-10-CM | POA: Diagnosis not present

## 2021-08-28 ENCOUNTER — Other Ambulatory Visit: Payer: Self-pay | Admitting: Family Medicine

## 2021-09-01 ENCOUNTER — Ambulatory Visit
Admission: RE | Admit: 2021-09-01 | Discharge: 2021-09-01 | Disposition: A | Discharge status: Home / Self Care | Payer: PPO | Source: Ambulatory Visit | Attending: Gastroenterology | Admitting: Gastroenterology

## 2021-09-01 DIAGNOSIS — K838 Other specified diseases of biliary tract: Secondary | ICD-10-CM | POA: Diagnosis not present

## 2021-09-01 DIAGNOSIS — R634 Abnormal weight loss: Secondary | ICD-10-CM

## 2021-09-01 DIAGNOSIS — R630 Anorexia: Secondary | ICD-10-CM

## 2021-09-01 DIAGNOSIS — N3289 Other specified disorders of bladder: Secondary | ICD-10-CM | POA: Diagnosis not present

## 2021-09-01 MED ORDER — IOPAMIDOL (ISOVUE-300) INJECTION 61%
100.0000 mL | Freq: Once | INTRAVENOUS | Status: AC | PRN
  Administered 2021-09-01: 100 mL via INTRAVENOUS

## 2021-09-06 ENCOUNTER — Encounter: Payer: Self-pay | Admitting: Family Medicine

## 2021-09-07 DIAGNOSIS — S0502XA Injury of conjunctiva and corneal abrasion without foreign body, left eye, initial encounter: Secondary | ICD-10-CM | POA: Diagnosis not present

## 2021-09-07 NOTE — Telephone Encounter (Signed)
Spoke with pt advised that the [employer] letter is ready for pick up at the office, pt requested for letter be mailed to her home address. Letter mailed out on 09/07/2021

## 2021-09-07 NOTE — Telephone Encounter (Signed)
The letter is ready  

## 2021-09-10 ENCOUNTER — Telehealth: Payer: Self-pay | Admitting: Family Medicine

## 2021-09-10 NOTE — Telephone Encounter (Signed)
Left message for patient to call back and schedule Medicare Annual Wellness Visit (AWV) either virtually or in office. Left  my jabber number [phone] ° ° °AWV-I per PAL[place] 09/20/09 °please schedule at anytime with [facility] Nurse Health Advisor 1 or 2 ° ° °This should be a 45 minute visit.  °

## 2021-09-20 ENCOUNTER — Other Ambulatory Visit: Payer: Self-pay | Admitting: Family Medicine

## 2021-10-07 ENCOUNTER — Other Ambulatory Visit: Payer: Self-pay | Admitting: Family Medicine

## 2021-10-13 DIAGNOSIS — L400 Psoriasis vulgaris: Secondary | ICD-10-CM | POA: Diagnosis not present

## 2021-10-13 DIAGNOSIS — L821 Other seborrheic keratosis: Secondary | ICD-10-CM | POA: Diagnosis not present

## 2021-10-13 DIAGNOSIS — D485 Neoplasm of uncertain behavior of skin: Secondary | ICD-10-CM | POA: Diagnosis not present

## 2021-10-13 DIAGNOSIS — L570 Actinic keratosis: Secondary | ICD-10-CM | POA: Diagnosis not present

## 2021-10-13 DIAGNOSIS — L814 Other melanin hyperpigmentation: Secondary | ICD-10-CM | POA: Diagnosis not present

## 2021-10-13 DIAGNOSIS — C44329 Squamous cell carcinoma of skin of other parts of face: Secondary | ICD-10-CM | POA: Diagnosis not present

## 2021-10-13 DIAGNOSIS — L408 Other psoriasis: Secondary | ICD-10-CM | POA: Diagnosis not present

## 2021-10-13 DIAGNOSIS — D225 Melanocytic nevi of trunk: Secondary | ICD-10-CM | POA: Diagnosis not present

## 2021-10-16 ENCOUNTER — Encounter: Payer: Self-pay | Admitting: Family Medicine

## 2021-10-16 ENCOUNTER — Ambulatory Visit (INDEPENDENT_AMBULATORY_CARE_PROVIDER_SITE_OTHER): Payer: PPO | Admitting: Family Medicine

## 2021-10-16 VITALS — BP 128/80 | HR 74 | Temp 98.0°F | Ht 63.0 in | Wt 164.0 lb

## 2021-10-16 DIAGNOSIS — Z0000 Encounter for general adult medical examination without abnormal findings: Secondary | ICD-10-CM

## 2021-10-16 DIAGNOSIS — I700 Atherosclerosis of aorta: Secondary | ICD-10-CM

## 2021-10-16 LAB — LIPID PANEL
Cholesterol: 198 mg/dL (ref 0–200)
HDL: 57.1 mg/dL (ref >39.00)
LDL Cholesterol: 126 mg/dL — ABNORMAL HIGH (ref 0–99)
NonHDL: 141.37
Total CHOL/HDL Ratio: 3
Triglycerides: 79 mg/dL (ref 0.0–149.0)
VLDL: 15.8 mg/dL (ref 0.0–40.0)

## 2021-10-16 LAB — CBC WITH DIFFERENTIAL/PLATELET
Basophils Absolute: 0 10*3/uL (ref 0.0–0.1)
Basophils Relative: 0.2 % (ref 0.0–3.0)
Eosinophils Absolute: 0.1 10*3/uL (ref 0.0–0.7)
Eosinophils Relative: 0.8 % (ref 0.0–5.0)
HCT: 41.1 % (ref 36.0–46.0)
Hemoglobin: 14 g/dL (ref 12.0–15.0)
Lymphocytes Relative: 20.4 % (ref 12.0–46.0)
Lymphs Abs: 1.8 10*3/uL (ref 0.7–4.0)
MCHC: 34.1 g/dL (ref 30.0–36.0)
MCV: 91.8 fl (ref 78.0–100.0)
Monocytes Absolute: 0.7 10*3/uL (ref 0.1–1.0)
Monocytes Relative: 8.1 % (ref 3.0–12.0)
Neutro Abs: 6.3 10*3/uL (ref 1.4–7.7)
Neutrophils Relative %: 70.5 % (ref 43.0–77.0)
Platelets: 236 10*3/uL (ref 150.0–400.0)
RBC: 4.47 Mil/uL (ref 3.87–5.11)
RDW: 12.6 % (ref 11.5–15.5)
WBC: 8.9 10*3/uL (ref 4.0–10.5)

## 2021-10-16 LAB — HEPATIC FUNCTION PANEL
ALT: 14 U/L (ref 0–35)
AST: 15 U/L (ref 0–37)
Albumin: 4.2 g/dL (ref 3.5–5.2)
Alkaline Phosphatase: 79 U/L (ref 39–117)
Bilirubin, Direct: 0.1 mg/dL (ref 0.0–0.3)
Total Bilirubin: 0.7 mg/dL (ref 0.2–1.2)
Total Protein: 6.8 g/dL (ref 6.0–8.3)

## 2021-10-16 LAB — BASIC METABOLIC PANEL
BUN: 18 mg/dL (ref 6–23)
CO2: 30 mEq/L (ref 19–32)
Calcium: 9.5 mg/dL (ref 8.4–10.5)
Chloride: 103 mEq/L (ref 96–112)
Creatinine, Ser: 0.84 mg/dL (ref 0.40–1.20)
GFR: 76.51 mL/min (ref >60.00)
Glucose, Bld: 94 mg/dL (ref 70–99)
Potassium: 4.1 mEq/L (ref 3.5–5.1)
Sodium: 139 mEq/L (ref 135–145)

## 2021-10-16 LAB — TSH: TSH: 0.87 u[IU]/mL (ref 0.35–5.50)

## 2021-10-16 MED ORDER — ATORVASTATIN CALCIUM 10 MG PO TABS: 10.0000 mg | ORAL_TABLET | Freq: Every day | ORAL | 3 refills | Status: DC

## 2021-10-16 NOTE — Progress Notes (Signed)
Subjective:    Patient ID: Ashley [name], female    DOB: 1961/09/14, 61 y.o.   MRN: [id number]  HPI Here for a well exam. She feels fine other than her fibromyalgia and back pain. She was seeing Dr. Watt Climes for constipation a few months ago, and part of her workup was an abdominal CT scan which was done on 09-01-21. This was normal as far as the GI tract, but some aortic calcifications were seen. She saw her Dermatologist, Lester McDonough NP, a few days ago and had a lesion biopsied.    Review of Systems  Constitutional: Negative.   HENT: Negative.    Eyes: Negative.   Respiratory: Negative.    Cardiovascular: Negative.   Gastrointestinal: Negative.   Genitourinary:  Negative for decreased urine volume, difficulty urinating, dyspareunia, dysuria, enuresis, flank pain, frequency, hematuria, pelvic pain and urgency.  Musculoskeletal:  Positive for back pain and myalgias.  Skin: Negative.   Neurological: Negative.  Negative for headaches.  Psychiatric/Behavioral: Negative.        Objective:   Physical Exam Constitutional:      General: She is not in acute distress.    Appearance: Normal appearance. She is well-developed.  HENT:     Head: Normocephalic and atraumatic.     Right Ear: External ear normal.     Left Ear: External ear normal.     Nose: Nose normal.     Mouth/Throat:     Pharynx: No oropharyngeal exudate.  Eyes:     General: No scleral icterus.    Conjunctiva/sclera: Conjunctivae normal.     Pupils: Pupils are equal, round, and reactive to light.  Neck:     Thyroid: No thyromegaly.     Vascular: No JVD.  Cardiovascular:     Rate and Rhythm: Normal rate and regular rhythm.     Heart sounds: Normal heart sounds. No murmur heard.   No friction rub. No gallop.  Pulmonary:     Effort: Pulmonary effort is normal. No respiratory distress.     Breath sounds: Normal breath sounds. No wheezing or rales.  Chest:     Chest wall: No tenderness.  Abdominal:     General:  Bowel sounds are normal. There is no distension.     Palpations: Abdomen is soft. There is no mass.     Tenderness: There is no abdominal tenderness. There is no guarding or rebound.  Musculoskeletal:        General: No tenderness. Normal range of motion.     Cervical back: Normal range of motion and neck supple.  Lymphadenopathy:     Cervical: No cervical adenopathy.  Skin:    General: Skin is warm and dry.     Findings: No erythema or rash.  Neurological:     Mental Status: She is alert and oriented to person, place, and time.     Cranial Nerves: No cranial nerve deficit.     Motor: No abnormal muscle tone.     Coordination: Coordination normal.     Deep Tendon Reflexes: Reflexes are normal and symmetric. Reflexes normal.  Psychiatric:        Behavior: Behavior normal.        Thought Content: Thought content normal.        Judgment: Judgment normal.          Assessment & Plan:  Well exam. We discussed diet and exercise. Get fasting labs. Because of the aortic calcifications we will start her on Lipitor 10 mg daily.  [name], MD

## 2021-10-19 LAB — HEMOGLOBIN A1C: Hgb A1c MFr Bld: 5 % (ref 4.6–6.5)

## 2021-10-20 ENCOUNTER — Ambulatory Visit (INDEPENDENT_AMBULATORY_CARE_PROVIDER_SITE_OTHER): Payer: PPO | Admitting: Family Medicine

## 2021-10-20 ENCOUNTER — Encounter: Payer: Self-pay | Admitting: Family Medicine

## 2021-10-20 VITALS — BP 132/80 | HR 71 | Temp 98.8°F | Wt 164.0 lb

## 2021-10-20 DIAGNOSIS — F1190 Opioid use, unspecified, uncomplicated: Secondary | ICD-10-CM | POA: Diagnosis not present

## 2021-10-20 DIAGNOSIS — G8929 Other chronic pain: Secondary | ICD-10-CM | POA: Diagnosis not present

## 2021-10-20 DIAGNOSIS — M797 Fibromyalgia: Secondary | ICD-10-CM | POA: Diagnosis not present

## 2021-10-20 DIAGNOSIS — G8918 Other acute postprocedural pain: Secondary | ICD-10-CM

## 2021-10-20 MED ORDER — OXYCODONE HCL 5 MG PO TABS: 5.0000 mg | ORAL_TABLET | Freq: Two times a day (BID) | ORAL | 0 refills | Status: DC | PRN

## 2021-10-20 MED ORDER — MORPHINE SULFATE ER 30 MG PO TBCR: 30.0000 mg | EXTENDED_RELEASE_TABLET | Freq: Two times a day (BID) | ORAL | 0 refills | Status: DC

## 2021-10-20 MED ORDER — ATORVASTATIN CALCIUM 20 MG PO TABS: 20.0000 mg | ORAL_TABLET | Freq: Every day | ORAL | 3 refills | Status: DC

## 2021-10-20 MED ORDER — METHOCARBAMOL 750 MG PO TABS: ORAL_TABLET | ORAL | 11 refills | Status: DC

## 2021-10-20 NOTE — Progress Notes (Signed)
° °  Subjective:    Patient ID: Ashley [name], female    DOB: 07-27-1961, 61 y.o.   MRN: [id number]  HPI Here for pain management. Her fibromyalgia is stable, although it hurts worse on damp days like today.    Review of Systems  Constitutional: Negative.   Respiratory: Negative.    Cardiovascular: Negative.   Musculoskeletal:  Positive for myalgias.      Objective:   Physical Exam Constitutional:      Appearance: Normal appearance.  Cardiovascular:     Rate and Rhythm: Normal rate and regular rhythm.     Pulses: Normal pulses.     Heart sounds: Normal heart sounds.  Pulmonary:     Effort: Pulmonary effort is normal.     Breath sounds: Normal breath sounds.  Musculoskeletal:     Comments: Diffusely tender over arms, legs, and trunk   Neurological:     Mental Status: She is alert.          Assessment & Plan:  Pain management. Indication for chronic opioid: fibromyalgia  Medication and dose: MS Contin 30 mg and Oxycodone 5 mg  # pills per month: 60 and 60 Last UDS date: 10-20-21 Opioid Treatment Agreement signed (Y/N): 11-02-18 Opioid Treatment Agreement last reviewed with patient: 10-20-21 NCCSRS reviewed this encounter (include red flags): Yes Meds were refilled. [name], MD

## 2021-10-20 NOTE — Addendum Note (Signed)
Addended by: Agnes Lawrence on: 10/20/2021 02:28 PM   Modules accepted: Orders

## 2021-10-20 NOTE — Addendum Note (Signed)
Addended by: Rosalyn Gess D on: 10/20/2021 04:26 PM   Modules accepted: Orders

## 2021-10-22 DIAGNOSIS — F3181 Bipolar II disorder: Secondary | ICD-10-CM | POA: Diagnosis not present

## 2021-10-23 LAB — DM TEMPLATE

## 2021-10-23 LAB — DRUG MONITOR, PANEL 1, W/CONF, URINE
Alphahydroxyalprazolam: 197 ng/mL — ABNORMAL HIGH (ref <25)
Alphahydroxymidazolam: NEGATIVE ng/mL (ref <50)
Alphahydroxytriazolam: NEGATIVE ng/mL (ref <50)
Aminoclonazepam: NEGATIVE ng/mL (ref <25)
Amphetamines: NEGATIVE ng/mL (ref <500)
Barbiturates: NEGATIVE ng/mL (ref <300)
Benzodiazepines: POSITIVE ng/mL — AB (ref <100)
Cocaine Metabolite: NEGATIVE ng/mL (ref <150)
Codeine: 143 ng/mL — ABNORMAL HIGH (ref <50)
Creatinine: 93.6 mg/dL (ref >=20.0)
Hydrocodone: NEGATIVE ng/mL (ref <50)
Hydromorphone: 226 ng/mL — ABNORMAL HIGH (ref <50)
Hydroxyethylflurazepam: NEGATIVE ng/mL (ref <50)
Lorazepam: NEGATIVE ng/mL (ref <50)
Marijuana Metabolite: 1538 ng/mL — ABNORMAL HIGH (ref <5)
Marijuana Metabolite: POSITIVE ng/mL — AB (ref <20)
Methadone Metabolite: NEGATIVE ng/mL (ref <100)
Morphine: >10000 ng/mL — ABNORMAL HIGH (ref <50)
Nordiazepam: NEGATIVE ng/mL (ref <50)
Norhydrocodone: NEGATIVE ng/mL (ref <50)
Noroxycodone: 1052 ng/mL — ABNORMAL HIGH (ref <50)
Opiates: POSITIVE ng/mL — AB (ref <100)
Oxazepam: NEGATIVE ng/mL (ref <50)
Oxidant: NEGATIVE ug/mL (ref <200)
Oxycodone: 353 ng/mL — ABNORMAL HIGH (ref <50)
Oxycodone: POSITIVE ng/mL — AB (ref <100)
Oxymorphone: 53 ng/mL — ABNORMAL HIGH (ref <50)
Phencyclidine: NEGATIVE ng/mL (ref <25)
Temazepam: NEGATIVE ng/mL (ref <50)
pH: 5.8 (ref 4.5–9.0)

## 2021-10-27 ENCOUNTER — Telehealth: Payer: Self-pay | Admitting: Family Medicine

## 2021-10-27 MED ORDER — METHYLPREDNISOLONE 4 MG PO TBPK: ORAL_TABLET | ORAL | 0 refills | Status: DC

## 2021-10-27 NOTE — Telephone Encounter (Signed)
I sent in a Medrol dose pack  

## 2021-10-27 NOTE — Telephone Encounter (Signed)
Spoke with patient, aware [facility]

## 2021-10-27 NOTE — Telephone Encounter (Signed)
Patient called in stating that she's experiencing a lot of pain. Patient wants to know if there's something Dr.Fry could do to stop the pain because she have to take care of her mother after her surgery on Thursday.   I offered the patient an appointment for tomorrow with Dr.Fry but she declined and would rather for me to send a message to the back.  Patient would like to be contacted at [phone].  Please advise.

## 2021-10-27 NOTE — Telephone Encounter (Signed)
Spoke with pt states that she is in severe pain and would like to have Dr Sarajane Jews prescribe prednisone since this has helped her pain before, Pt states that she is taking  her pain medications but not helping, Pt was offered a virtual appointment, states that she is willing to get one if Dr Sarajane Jews thinks she needs one, please advise

## 2021-11-02 ENCOUNTER — Ambulatory Visit (INDEPENDENT_AMBULATORY_CARE_PROVIDER_SITE_OTHER): Payer: PPO | Admitting: Family Medicine

## 2021-11-02 ENCOUNTER — Ambulatory Visit: Payer: PPO | Admitting: Family Medicine

## 2021-11-02 ENCOUNTER — Encounter: Payer: Self-pay | Admitting: Family Medicine

## 2021-11-02 VITALS — BP 132/88 | HR 73 | Temp 98.0°F | Wt 162.0 lb

## 2021-11-02 DIAGNOSIS — G8929 Other chronic pain: Secondary | ICD-10-CM | POA: Diagnosis not present

## 2021-11-02 DIAGNOSIS — M545 Low back pain, unspecified: Secondary | ICD-10-CM

## 2021-11-02 MED ORDER — PREDNISONE 10 MG PO TABS: ORAL_TABLET | ORAL | 0 refills | Status: DC

## 2021-11-02 NOTE — Progress Notes (Signed)
° °  Subjective:    Patient ID: Ashley [name], female    DOB: 08-21-1961, 61 y.o.   MRN: [id number]  HPI Here for a flare up of sciatica. One week ago she suddenly developed severe sharp pain in the right lower back that radiates down the right leg. She also has numbness and tingling at times, but no weakness. Prednisone generally helps with this.    Review of Systems  Constitutional: Negative.   Respiratory: Negative.    Cardiovascular: Negative.   Musculoskeletal:  Positive for back pain.      Objective:   Physical Exam Constitutional:      Comments: In some pain   Cardiovascular:     Rate and Rhythm: Normal rate and regular rhythm.     Pulses: Normal pulses.     Heart sounds: Normal heart sounds.  Pulmonary:     Effort: Pulmonary effort is normal.     Breath sounds: Normal breath sounds.  Musculoskeletal:     Comments: Very tender in the right lower back and sciatic notch. ROM is reduced and SLR is positive on the right.   Neurological:     Mental Status: She is alert.          Assessment & Plan:  Right sided sciatica. Treat with a 20 day taper of Prednisone, beginning with 50 my a day. She is scheduled to see [facility] next week.  [name], MD

## 2021-12-11 ENCOUNTER — Telehealth: Payer: Self-pay | Admitting: Family Medicine

## 2021-12-11 MED ORDER — ONDANSETRON HCL 8 MG PO TABS: 8.0000 mg | ORAL_TABLET | Freq: Four times a day (QID) | ORAL | 2 refills | Status: AC | PRN

## 2021-12-11 NOTE — Telephone Encounter (Signed)
I sent in the Zofran  ?

## 2021-12-11 NOTE — Telephone Encounter (Signed)
Patient called in requesting an appt to see Dr.Fry. Patient is scheduled for Monday, March 27th with Dr.Fry. Patient is now requesting zofran or something to be sent in to her pharmacy. ? ?Please advise. ?

## 2021-12-11 NOTE — Telephone Encounter (Signed)
Pt was Triaged for nausea,loss of appetite and trouble swallowing, Pt is requesting Zofran to help with nausea before her appointment with Dr Sarajane Jews on Monday ?

## 2021-12-14 ENCOUNTER — Encounter: Payer: Self-pay | Admitting: Family Medicine

## 2021-12-14 ENCOUNTER — Telehealth (INDEPENDENT_AMBULATORY_CARE_PROVIDER_SITE_OTHER): Payer: PPO | Admitting: Family Medicine

## 2021-12-14 DIAGNOSIS — M25551 Pain in right hip: Secondary | ICD-10-CM | POA: Diagnosis not present

## 2021-12-14 DIAGNOSIS — K219 Gastro-esophageal reflux disease without esophagitis: Secondary | ICD-10-CM

## 2021-12-14 DIAGNOSIS — K30 Functional dyspepsia: Secondary | ICD-10-CM | POA: Diagnosis not present

## 2021-12-14 DIAGNOSIS — M25552 Pain in left hip: Secondary | ICD-10-CM

## 2021-12-14 NOTE — Progress Notes (Signed)
? ?Subjective:  ? ? Patient ID: Ashley [name], female    DOB: Jan 16, 1961, 61 y.o.   MRN: [id number] ? ?HPI ?Virtual Visit via Video Note ? ?I connected with the patient on 12/14/21 at  1:15 PM EDT by a video enabled telemedicine application and verified that I am speaking with the correct person using two identifiers. ? Location patient: home ?Location provider:work or home office ?Persons participating in the virtual visit: patient, provider ? ?I discussed the limitations of evaluation and management by telemedicine and the availability of in person appointments. The patient expressed understanding and agreed to proceed. ? ? ?HPI: ?Here for several issues. First she has been dealing with upper abdominal fullness and bloating for years, and she often feels full after eating just a few bites of food. She had an EGD per Dr. Watt Climes in 2016 which showed a small hiatal hernia. She also has a long hx of GERD, especially at night. For the past year she has been taking Metoclopramide 5 mg at bedtime, and this has helped. Now for the past few weeks she has also had nausea and vomiting after eating meals. Zofran helps with this. The other issue is bilateral hip pains. This has been going on for several years but lately this has been worse. She had Xrays in 2020 that were normal.  ? ? ?ROS: See pertinent positives and negatives per HPI. ? ?Past Medical History:  ?Diagnosis Date  ? Allergy   ? Asthma   ? Bipolar disorder ([place])   ? dr Shirlee More   ? Chronic lower back pain   ? sees Dr. Milinda Pointer at [facility]   ? Depression   ? Fibromyalgia   ? dr Milinda Pointer [place]  ? GERD (gastroesophageal reflux disease)   ? Headache(784.0)   ? HTN (hypertension)   ? IBS (irritable bowel syndrome)   ? dr Watt Climes  ? Injury of lower back 09-23-05  ? Workers Comp case worker is Michelene Gardener (phone [phone] or fax [phone])  ? Reflex sympathetic dystrophy   ? left hand dr Annie Main and dr  Nelva Bush  ? ? ?Past Surgical History:  ?Procedure Laterality Date  ? APPENDECTOMY    ? CARPAL TUNNEL RELEASE Bilateral   ? CERVICAL SPINE SURGERY    ? CHOLECYSTECTOMY    ? COLONOSCOPY  09/05/2020  ? per Dr. Watt Climes, clear, repeat in 5 yrs   ? ESOPHAGOGASTRODUODENOSCOPY  05/2015  ? per Dr. Watt Climes, benign gastric polyps  ? left shouder  1996  ? separation, x 2  ? NASAL SINUS SURGERY    ? radiofrequency  ablations april 2010 to lower spine per dr Marjorie Smolder    ? removal of Left great toenail Left 01/2016  ? TRIGGER FINGER RELEASE Right   ? ? ?Family History  ?Problem Relation Age of Onset  ? Hypertension Mother   ? Cancer Father   ?     lung  ? Cancer Maternal Aunt   ? Dementia Maternal Grandmother   ? Stroke Maternal Grandmother   ? Dementia Maternal Grandfather   ? Heart attack Maternal Grandfather   ? Alzheimer's disease Paternal Grandmother   ? Heart attack Paternal Grandfather   ? ? ? ?Current Outpatient Medications:  ?  alprazolam (XANAX) 2 MG tablet, Take 1 tablet (2 mg total) by mouth 3 (three) times daily as needed., Disp: 90 tablet, Rfl: 5 ?  atorvastatin (LIPITOR) 20 MG tablet, Take 1 tablet (20 mg total) by mouth daily., Disp: 90  tablet, Rfl: 3 ?  buPROPion (WELLBUTRIN XL) 150 MG 24 hr tablet, Take 150 mg by mouth daily., Disp: , Rfl:  ?  calcium-vitamin D (OSCAL) 250-125 MG-UNIT per tablet, Take 1 tablet by mouth daily., Disp: , Rfl:  ?  diclofenac Sodium (VOLTAREN) 1 % GEL, Apply 4 g topically 4 (four) times daily., Disp: 100 g, Rfl: 11 ?  Estradiol 10 % CREA, by Does not apply route., Disp: , Rfl:  ?  fluticasone (CUTIVATE) 0.05 % cream, Apply topically as needed., Disp: , Rfl:  ?  fluticasone (FLONASE) 50 MCG/ACT nasal spray, Place 2 sprays into both nostrils daily. (Patient taking differently: Place 2 sprays into both nostrils as needed.), Disp: 16 g, Rfl: 6 ?  Glucosamine-Chondroit-Vit C-Mn (GLUCOSAMINE CHONDR 1500 COMPLX PO), Take by mouth., Disp: , Rfl:  ?  meloxicam (MOBIC) 15 MG tablet, TAKE ONE TABLET BY  MOUTH DAILY, Disp: 90 tablet, Rfl: 3 ?  methocarbamol (ROBAXIN) 750 MG tablet, TAKE 1 TABLET BY MOUTH EVERY 6 HOURS AS NEEDED FOR MUSCLE SPASMS, Disp: 120 tablet, Rfl: 11 ?  metoCLOPramide (REGLAN) 5 MG tablet, Take 5 mg by mouth at bedtime as needed., Disp: , Rfl:  ?  [START ON 12/26/2021] morphine (MS CONTIN) 30 MG 12 hr tablet, Take 1 tablet (30 mg total) by mouth every 12 (twelve) hours., Disp: 60 tablet, Rfl: 0 ?  Multiple Vitamin (MULTIVITAMIN) capsule, Take 1 capsule by mouth daily., Disp: , Rfl:  ?  omeprazole (PRILOSEC) 40 MG capsule, Take 40 mg daily by mouth. , Disp: , Rfl: 1 ?  ondansetron (ZOFRAN) 8 MG tablet, Take 1 tablet (8 mg total) by mouth every 6 (six) hours as needed for nausea or vomiting., Disp: 60 tablet, Rfl: 2 ?  oxybutynin (DITROPAN) 5 MG tablet, TAKE 1 TABLET BY MOUTH TWICE A DAY, Disp: 180 tablet, Rfl: 0 ?  [START ON 12/26/2021] oxyCODONE (OXY IR/ROXICODONE) 5 MG immediate release tablet, Take 1 tablet (5 mg total) by mouth every 12 (twelve) hours as needed for severe pain., Disp: 60 tablet, Rfl: 0 ?  potassium chloride (KLOR-CON) 10 MEQ tablet, TAKE 1 TABLET BY MOUTH TWICE A DAY, Disp: 180 tablet, Rfl: 0 ?  risperiDONE (RISPERDAL) 2 MG tablet, Take 2 mg by mouth at bedtime., Disp: , Rfl:  ?  traZODone (DESYREL) 150 MG tablet, Take 75 mg by mouth at bedtime., Disp: , Rfl:  ?  valACYclovir (VALTREX) 1000 MG tablet, Take 1,000 mg by mouth as needed. , Disp: , Rfl:  ? ?EXAM: ? ?VITALS per patient if applicable: ? ?GENERAL: alert, oriented, appears well and in no acute distress ? ?HEENT: atraumatic, conjunttiva clear, no obvious abnormalities on inspection of external nose and ears ? ?NECK: normal movements of the head and neck ? ?LUNGS: on inspection no signs of respiratory distress, breathing rate appears normal, no obvious gross SOB, gasping or wheezing ? ?CV: no obvious cyanosis ? ?MS: moves all visible extremities without noticeable abnormality ? ?PSYCH/NEURO: pleasant and cooperative, no  obvious depression or anxiety, speech and thought processing grossly intact ? ?ASSESSMENT AND PLAN: ?Her upper abdominal symptoms suggest delayed gastric emptying, so she will try taking Metoclopramide 5 mg QID (with meals and at bedtime) for 2 weeks. She will report back at that time. We will also refer her for some PT for her hip pains.[name], MD ?' ?Discussed the following assessment and plan: ? ?No diagnosis found. ? ? ?  ?I discussed the assessment and treatment plan with the patient. The patient  was provided an opportunity to ask questions and all were answered. The patient agreed with the plan and demonstrated an understanding of the instructions. ?  ?The patient was advised to call back or seek an in-person evaluation if the symptoms worsen or if the condition fails to improve as anticipated. ? ?  ? ? ?Review of Systems ? ?   ?Objective:  ? Physical Exam ? ? ? ? ?   ?Assessment & Plan:  ? ? ?

## 2021-12-16 ENCOUNTER — Telehealth: Payer: Self-pay | Admitting: Family Medicine

## 2021-12-16 DIAGNOSIS — K30 Functional dyspepsia: Secondary | ICD-10-CM

## 2021-12-16 MED ORDER — METOCLOPRAMIDE HCL 5 MG PO TABS: 5.0000 mg | ORAL_TABLET | Freq: Three times a day (TID) | ORAL | 0 refills | Status: DC

## 2021-12-16 NOTE — Telephone Encounter (Signed)
Note from VV on 12/14/21: try taking Metoclopramide 5 mg QID (with meals and at bedtime) for 2 weeks. She will report back at that time. We will also refer her for some PT for her hip pains.[name], MD ? ?Medication ordered per order above. ?

## 2021-12-16 NOTE — Telephone Encounter (Signed)
Patient called in stating that Dr.Fry was supposed to call in a refill for metoCLOPramide (REGLAN) 5 MG tablet [[id number]] [facility] [place], [place] [address] . ? ?Please advise. ?

## 2021-12-21 ENCOUNTER — Ambulatory Visit: Payer: PPO | Admitting: Family Medicine

## 2021-12-24 ENCOUNTER — Encounter: Payer: Self-pay | Admitting: Family Medicine

## 2021-12-28 NOTE — Telephone Encounter (Signed)
(  1) as part of the referral to PT that I sent in I clearly specified "to [facility] on [address] in [place]" so I don't understand why this was not done. Please speak to the referral person and have this fixed. (2) call in Metaclopramide 5 mg QID, # 120 with 5 rf  ?

## 2021-12-29 ENCOUNTER — Telehealth: Payer: Self-pay | Admitting: Family Medicine

## 2021-12-29 ENCOUNTER — Other Ambulatory Visit: Payer: Self-pay

## 2021-12-29 DIAGNOSIS — K219 Gastro-esophageal reflux disease without esophagitis: Secondary | ICD-10-CM

## 2021-12-29 MED ORDER — METOCLOPRAMIDE HCL 5 MG PO TABS: 5.0000 mg | ORAL_TABLET | Freq: Four times a day (QID) | ORAL | 5 refills | Status: DC

## 2021-12-29 NOTE — Telephone Encounter (Signed)
Left message for patient to call back and schedule Medicare Annual Wellness Visit (AWV) either virtually or in office. Left  my jabber number [phone] ? ? ? AWV-I per [facility]  please schedule at anytime with [facility] Nurse Health Advisor 1 or 2 ? ? ?This should be a 45 minute visit.  ?

## 2022-01-05 ENCOUNTER — Other Ambulatory Visit: Payer: Self-pay | Admitting: Family Medicine

## 2022-01-07 DIAGNOSIS — M25551 Pain in right hip: Secondary | ICD-10-CM | POA: Diagnosis not present

## 2022-01-07 DIAGNOSIS — M799 Soft tissue disorder, unspecified: Secondary | ICD-10-CM | POA: Diagnosis not present

## 2022-01-07 DIAGNOSIS — M25552 Pain in left hip: Secondary | ICD-10-CM | POA: Diagnosis not present

## 2022-01-07 DIAGNOSIS — M6281 Muscle weakness (generalized): Secondary | ICD-10-CM | POA: Diagnosis not present

## 2022-01-11 ENCOUNTER — Other Ambulatory Visit: Payer: Self-pay | Admitting: Family Medicine

## 2022-01-12 DIAGNOSIS — M25551 Pain in right hip: Secondary | ICD-10-CM | POA: Diagnosis not present

## 2022-01-12 DIAGNOSIS — M6281 Muscle weakness (generalized): Secondary | ICD-10-CM | POA: Diagnosis not present

## 2022-01-12 DIAGNOSIS — M25552 Pain in left hip: Secondary | ICD-10-CM | POA: Diagnosis not present

## 2022-01-12 DIAGNOSIS — M799 Soft tissue disorder, unspecified: Secondary | ICD-10-CM | POA: Diagnosis not present

## 2022-01-14 DIAGNOSIS — M6281 Muscle weakness (generalized): Secondary | ICD-10-CM | POA: Diagnosis not present

## 2022-01-14 DIAGNOSIS — M25552 Pain in left hip: Secondary | ICD-10-CM | POA: Diagnosis not present

## 2022-01-14 DIAGNOSIS — M799 Soft tissue disorder, unspecified: Secondary | ICD-10-CM | POA: Diagnosis not present

## 2022-01-14 DIAGNOSIS — M25551 Pain in right hip: Secondary | ICD-10-CM | POA: Diagnosis not present

## 2022-01-18 ENCOUNTER — Ambulatory Visit (INDEPENDENT_AMBULATORY_CARE_PROVIDER_SITE_OTHER): Payer: PPO | Admitting: Family Medicine

## 2022-01-18 ENCOUNTER — Encounter: Payer: Self-pay | Admitting: Family Medicine

## 2022-01-18 VITALS — BP 124/76 | HR 66 | Temp 98.7°F | Wt 161.0 lb

## 2022-01-18 DIAGNOSIS — M797 Fibromyalgia: Secondary | ICD-10-CM

## 2022-01-18 DIAGNOSIS — F1190 Opioid use, unspecified, uncomplicated: Secondary | ICD-10-CM

## 2022-01-18 DIAGNOSIS — G8918 Other acute postprocedural pain: Secondary | ICD-10-CM

## 2022-01-18 MED ORDER — MORPHINE SULFATE ER 30 MG PO TBCR: 30.0000 mg | EXTENDED_RELEASE_TABLET | Freq: Two times a day (BID) | ORAL | 0 refills | Status: DC

## 2022-01-18 MED ORDER — OXYCODONE HCL 5 MG PO TABS: 5.0000 mg | ORAL_TABLET | Freq: Two times a day (BID) | ORAL | 0 refills | Status: DC | PRN

## 2022-01-18 NOTE — Progress Notes (Signed)
? ?  Subjective:  ? ? Patient ID: Ashley [name], female    DOB: May 08, 1961, 61 y.o.   MRN: [id number] ? ?HPI ?Here for pain management. She is doing well. She is in PT now, and after only 4 sessions she sees some improvement in her pain levels. She asks to keep her medications the same for now, but she thinks by our next PMV she will be able to start cutting back on them.   ? ? ?Review of Systems  ?Constitutional: Negative.   ?Musculoskeletal:  Positive for myalgias.  ? ?   ?Objective:  ? Physical Exam ?Constitutional:   ?   Appearance: Normal appearance. She is not ill-appearing.  ?Neurological:  ?   Mental Status: She is alert.  ? ? ? ? ? ?   ?Assessment & Plan:  ?Pain management. ?Indication for chronic opioid: fibromyalgia  ?Medication and dose: MS Contin 30 mg and Oxycodone 5 mg  ?# pills per month: 60 and 60 ?Last UDS date: 10-20-21 ?Opioid Treatment Agreement signed (Y/N): 11-02-18 ?Opioid Treatment Agreement last reviewed with patient:  01-18-22 ?NCCSRS reviewed this encounter (include red flags): Yes ?Meds were refilled.  ?[name], MD ? ? ?

## 2022-01-19 DIAGNOSIS — M6281 Muscle weakness (generalized): Secondary | ICD-10-CM | POA: Diagnosis not present

## 2022-01-19 DIAGNOSIS — M799 Soft tissue disorder, unspecified: Secondary | ICD-10-CM | POA: Diagnosis not present

## 2022-01-19 DIAGNOSIS — M25551 Pain in right hip: Secondary | ICD-10-CM | POA: Diagnosis not present

## 2022-01-19 DIAGNOSIS — M25552 Pain in left hip: Secondary | ICD-10-CM | POA: Diagnosis not present

## 2022-01-21 ENCOUNTER — Telehealth: Payer: Self-pay | Admitting: Family Medicine

## 2022-01-21 DIAGNOSIS — M799 Soft tissue disorder, unspecified: Secondary | ICD-10-CM | POA: Diagnosis not present

## 2022-01-21 DIAGNOSIS — M6281 Muscle weakness (generalized): Secondary | ICD-10-CM | POA: Diagnosis not present

## 2022-01-21 DIAGNOSIS — M25552 Pain in left hip: Secondary | ICD-10-CM | POA: Diagnosis not present

## 2022-01-21 DIAGNOSIS — M25551 Pain in right hip: Secondary | ICD-10-CM | POA: Diagnosis not present

## 2022-01-21 NOTE — Telephone Encounter (Signed)
Left message for patient to call back and schedule Medicare Annual Wellness Visit (AWV) either virtually or in office. Left  my jabber number [phone] ° ° °AWV-I per PAL[place] 09/20/09 °please schedule at anytime with [facility] Nurse Health Advisor 1 or 2 ° ° °This should be a 45 minute visit.  °

## 2022-01-28 DIAGNOSIS — M6281 Muscle weakness (generalized): Secondary | ICD-10-CM | POA: Diagnosis not present

## 2022-01-28 DIAGNOSIS — M25551 Pain in right hip: Secondary | ICD-10-CM | POA: Diagnosis not present

## 2022-01-28 DIAGNOSIS — M799 Soft tissue disorder, unspecified: Secondary | ICD-10-CM | POA: Diagnosis not present

## 2022-01-28 DIAGNOSIS — M25552 Pain in left hip: Secondary | ICD-10-CM | POA: Diagnosis not present

## 2022-02-03 DIAGNOSIS — M25551 Pain in right hip: Secondary | ICD-10-CM | POA: Diagnosis not present

## 2022-02-03 DIAGNOSIS — M25552 Pain in left hip: Secondary | ICD-10-CM | POA: Diagnosis not present

## 2022-02-03 DIAGNOSIS — M6281 Muscle weakness (generalized): Secondary | ICD-10-CM | POA: Diagnosis not present

## 2022-02-03 DIAGNOSIS — M799 Soft tissue disorder, unspecified: Secondary | ICD-10-CM | POA: Diagnosis not present

## 2022-02-10 DIAGNOSIS — M25551 Pain in right hip: Secondary | ICD-10-CM | POA: Diagnosis not present

## 2022-02-10 DIAGNOSIS — M25552 Pain in left hip: Secondary | ICD-10-CM | POA: Diagnosis not present

## 2022-02-10 DIAGNOSIS — M6281 Muscle weakness (generalized): Secondary | ICD-10-CM | POA: Diagnosis not present

## 2022-02-10 DIAGNOSIS — M799 Soft tissue disorder, unspecified: Secondary | ICD-10-CM | POA: Diagnosis not present

## 2022-02-16 DIAGNOSIS — M25552 Pain in left hip: Secondary | ICD-10-CM | POA: Diagnosis not present

## 2022-02-16 DIAGNOSIS — M25551 Pain in right hip: Secondary | ICD-10-CM | POA: Diagnosis not present

## 2022-02-16 DIAGNOSIS — M799 Soft tissue disorder, unspecified: Secondary | ICD-10-CM | POA: Diagnosis not present

## 2022-02-16 DIAGNOSIS — M6281 Muscle weakness (generalized): Secondary | ICD-10-CM | POA: Diagnosis not present

## 2022-02-18 DIAGNOSIS — F3181 Bipolar II disorder: Secondary | ICD-10-CM | POA: Diagnosis not present

## 2022-02-22 DIAGNOSIS — M25551 Pain in right hip: Secondary | ICD-10-CM | POA: Diagnosis not present

## 2022-02-22 DIAGNOSIS — M25552 Pain in left hip: Secondary | ICD-10-CM | POA: Diagnosis not present

## 2022-02-22 DIAGNOSIS — M799 Soft tissue disorder, unspecified: Secondary | ICD-10-CM | POA: Diagnosis not present

## 2022-02-22 DIAGNOSIS — M6281 Muscle weakness (generalized): Secondary | ICD-10-CM | POA: Diagnosis not present

## 2022-02-26 DIAGNOSIS — M25552 Pain in left hip: Secondary | ICD-10-CM | POA: Diagnosis not present

## 2022-02-26 DIAGNOSIS — M25551 Pain in right hip: Secondary | ICD-10-CM | POA: Diagnosis not present

## 2022-02-26 DIAGNOSIS — M6281 Muscle weakness (generalized): Secondary | ICD-10-CM | POA: Diagnosis not present

## 2022-02-26 DIAGNOSIS — M799 Soft tissue disorder, unspecified: Secondary | ICD-10-CM | POA: Diagnosis not present

## 2022-03-01 ENCOUNTER — Telehealth: Payer: Self-pay | Admitting: Family Medicine

## 2022-03-01 NOTE — Telephone Encounter (Signed)
Spoke with patient to schedule Medicare Annual Wellness Visit (AWV) either virtually or in office.  She wanted call back 1st of July going out of town     awvi 09/20/09 per [facility] please schedule at anytime with [facility] Nurse Health Advisor 1 or 2   This should be a 45 minute visit.

## 2022-03-04 DIAGNOSIS — M799 Soft tissue disorder, unspecified: Secondary | ICD-10-CM | POA: Diagnosis not present

## 2022-03-04 DIAGNOSIS — M25551 Pain in right hip: Secondary | ICD-10-CM | POA: Diagnosis not present

## 2022-03-04 DIAGNOSIS — M25552 Pain in left hip: Secondary | ICD-10-CM | POA: Diagnosis not present

## 2022-03-04 DIAGNOSIS — M6281 Muscle weakness (generalized): Secondary | ICD-10-CM | POA: Diagnosis not present

## 2022-03-17 DIAGNOSIS — S76111A Strain of right quadriceps muscle, fascia and tendon, initial encounter: Secondary | ICD-10-CM | POA: Diagnosis not present

## 2022-03-17 DIAGNOSIS — M25551 Pain in right hip: Secondary | ICD-10-CM | POA: Diagnosis not present

## 2022-03-26 DIAGNOSIS — M6281 Muscle weakness (generalized): Secondary | ICD-10-CM | POA: Diagnosis not present

## 2022-03-26 DIAGNOSIS — M25552 Pain in left hip: Secondary | ICD-10-CM | POA: Diagnosis not present

## 2022-03-26 DIAGNOSIS — M799 Soft tissue disorder, unspecified: Secondary | ICD-10-CM | POA: Diagnosis not present

## 2022-03-26 DIAGNOSIS — M25551 Pain in right hip: Secondary | ICD-10-CM | POA: Diagnosis not present

## 2022-03-29 DIAGNOSIS — M25552 Pain in left hip: Secondary | ICD-10-CM | POA: Diagnosis not present

## 2022-03-29 DIAGNOSIS — M6281 Muscle weakness (generalized): Secondary | ICD-10-CM | POA: Diagnosis not present

## 2022-03-29 DIAGNOSIS — M25551 Pain in right hip: Secondary | ICD-10-CM | POA: Diagnosis not present

## 2022-03-29 DIAGNOSIS — M799 Soft tissue disorder, unspecified: Secondary | ICD-10-CM | POA: Diagnosis not present

## 2022-03-31 ENCOUNTER — Telehealth: Payer: Self-pay | Admitting: Family Medicine

## 2022-03-31 NOTE — Telephone Encounter (Signed)
Spoke with patient to schedule Medicare Annual Wellness Visit (AWV) either virtually or in office.   Patient wanted me to call back 7/17 to scheudle awvi 09/20/09 per [facility] please schedule at anytime with [facility] Nurse Health Advisor 1 or 2   This should be a 45 minute visit.

## 2022-04-02 DIAGNOSIS — M25552 Pain in left hip: Secondary | ICD-10-CM | POA: Diagnosis not present

## 2022-04-02 DIAGNOSIS — M799 Soft tissue disorder, unspecified: Secondary | ICD-10-CM | POA: Diagnosis not present

## 2022-04-02 DIAGNOSIS — M6281 Muscle weakness (generalized): Secondary | ICD-10-CM | POA: Diagnosis not present

## 2022-04-02 DIAGNOSIS — M25551 Pain in right hip: Secondary | ICD-10-CM | POA: Diagnosis not present

## 2022-04-05 ENCOUNTER — Telehealth: Payer: Self-pay | Admitting: Family Medicine

## 2022-04-05 NOTE — Telephone Encounter (Signed)
Left message for patient to call back and schedule Medicare Annual Wellness Visit (AWV) either virtually or in office. Left  my Herbie Drape number [phone]    awvi 09/20/09 per [facility]  please schedule at anytime with [facility] Nurse Health Advisor 1 or 2   This should be a 45 minute visit.

## 2022-04-07 ENCOUNTER — Other Ambulatory Visit: Payer: Self-pay | Admitting: Family Medicine

## 2022-04-07 DIAGNOSIS — M25552 Pain in left hip: Secondary | ICD-10-CM | POA: Diagnosis not present

## 2022-04-07 DIAGNOSIS — M25551 Pain in right hip: Secondary | ICD-10-CM | POA: Diagnosis not present

## 2022-04-07 DIAGNOSIS — M799 Soft tissue disorder, unspecified: Secondary | ICD-10-CM | POA: Diagnosis not present

## 2022-04-07 DIAGNOSIS — M6281 Muscle weakness (generalized): Secondary | ICD-10-CM | POA: Diagnosis not present

## 2022-04-09 DIAGNOSIS — M25551 Pain in right hip: Secondary | ICD-10-CM | POA: Diagnosis not present

## 2022-04-09 DIAGNOSIS — M25552 Pain in left hip: Secondary | ICD-10-CM | POA: Diagnosis not present

## 2022-04-09 DIAGNOSIS — M799 Soft tissue disorder, unspecified: Secondary | ICD-10-CM | POA: Diagnosis not present

## 2022-04-09 DIAGNOSIS — M6281 Muscle weakness (generalized): Secondary | ICD-10-CM | POA: Diagnosis not present

## 2022-04-15 DIAGNOSIS — M799 Soft tissue disorder, unspecified: Secondary | ICD-10-CM | POA: Diagnosis not present

## 2022-04-15 DIAGNOSIS — M25552 Pain in left hip: Secondary | ICD-10-CM | POA: Diagnosis not present

## 2022-04-15 DIAGNOSIS — M25551 Pain in right hip: Secondary | ICD-10-CM | POA: Diagnosis not present

## 2022-04-15 DIAGNOSIS — M6281 Muscle weakness (generalized): Secondary | ICD-10-CM | POA: Diagnosis not present

## 2022-04-20 ENCOUNTER — Ambulatory Visit (INDEPENDENT_AMBULATORY_CARE_PROVIDER_SITE_OTHER): Payer: PPO | Admitting: Family Medicine

## 2022-04-20 ENCOUNTER — Encounter: Payer: Self-pay | Admitting: Family Medicine

## 2022-04-20 VITALS — BP 128/84 | HR 68 | Temp 98.4°F | Wt 168.0 lb

## 2022-04-20 DIAGNOSIS — F1190 Opioid use, unspecified, uncomplicated: Secondary | ICD-10-CM | POA: Diagnosis not present

## 2022-04-20 DIAGNOSIS — M797 Fibromyalgia: Secondary | ICD-10-CM | POA: Diagnosis not present

## 2022-04-20 DIAGNOSIS — G8918 Other acute postprocedural pain: Secondary | ICD-10-CM

## 2022-04-20 MED ORDER — KETOCONAZOLE 2 % EX CREA: 1.0000 | TOPICAL_CREAM | Freq: Two times a day (BID) | CUTANEOUS | 5 refills | Status: AC | PRN

## 2022-04-20 MED ORDER — MORPHINE SULFATE ER 30 MG PO TBCR: 30.0000 mg | EXTENDED_RELEASE_TABLET | Freq: Two times a day (BID) | ORAL | 0 refills | Status: DC

## 2022-04-20 MED ORDER — OXYCODONE HCL 5 MG PO TABS: 5.0000 mg | ORAL_TABLET | Freq: Two times a day (BID) | ORAL | 0 refills | Status: DC | PRN

## 2022-04-20 NOTE — Progress Notes (Signed)
   Subjective:    Patient ID: Ashley [name], female    DOB: 09-Dec-1960, 61 y.o.   MRN: [id number]  HPI Here for pain management. She is doing about the same. She meets with a PT once a week, and then she exercises in the swimming pool on her own twice a week. She is considering quitting her current job which involves a lot of standing. 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    Review of Systems  Constitutional: Negative.   Musculoskeletal:  Positive for myalgias.       Objective:   Physical Exam Constitutional:      Appearance: Normal appearance.  Neurological:     Mental Status: She is alert.           Assessment & Plan:  Pain management. Indication for chronic opioid: fibromyalgia  Medication and dose: MS Contin 30 mg and Oxycodone 5 mg # pills per month: 60 and 60 Last UDS date: 10-20-21 Opioid Treatment Agreement signed (Y/N): 11-02-18 Opioid Treatment Agreement last reviewed with patient:  04-20-22 NCCSRS reviewed this encounter (include red flags): Yes Meds were refilled. [name], MD

## 2022-04-27 DIAGNOSIS — M25551 Pain in right hip: Secondary | ICD-10-CM | POA: Diagnosis not present

## 2022-04-27 DIAGNOSIS — M6281 Muscle weakness (generalized): Secondary | ICD-10-CM | POA: Diagnosis not present

## 2022-04-27 DIAGNOSIS — M799 Soft tissue disorder, unspecified: Secondary | ICD-10-CM | POA: Diagnosis not present

## 2022-04-27 DIAGNOSIS — M25552 Pain in left hip: Secondary | ICD-10-CM | POA: Diagnosis not present

## 2022-05-05 ENCOUNTER — Telehealth: Payer: Self-pay | Admitting: Family Medicine

## 2022-05-05 NOTE — Telephone Encounter (Signed)
Left message for patient to call back and schedule Medicare Annual Wellness Visit (AWV) either virtually or in office. Left  my Herbie Drape number [phone]  awvi 09/20/09 per [facility]  ; please schedule at anytime with [facility] Nurse Health Advisor 1 or 2   This should be a 45 minute visit.

## 2022-06-02 ENCOUNTER — Telehealth: Payer: Self-pay | Admitting: Family Medicine

## 2022-06-02 NOTE — Telephone Encounter (Signed)
Left message for patient to call back and schedule Medicare Annual Wellness Visit (AWV) either virtually or in office. Left  my Ashley [name] number [phone]   awvi 09/20/09 per [place]  please schedule at anytime with [facility] Nurse Health Advisor 1 or 2   This should be a 45 minute visit.

## 2022-06-10 DIAGNOSIS — F3181 Bipolar II disorder: Secondary | ICD-10-CM | POA: Diagnosis not present

## 2022-06-11 DIAGNOSIS — M8580 Other specified disorders of bone density and structure, unspecified site: Secondary | ICD-10-CM | POA: Diagnosis not present

## 2022-06-11 DIAGNOSIS — Z1231 Encounter for screening mammogram for malignant neoplasm of breast: Secondary | ICD-10-CM | POA: Diagnosis not present

## 2022-06-11 DIAGNOSIS — N952 Postmenopausal atrophic vaginitis: Secondary | ICD-10-CM | POA: Diagnosis not present

## 2022-06-11 DIAGNOSIS — Z01419 Encounter for gynecological examination (general) (routine) without abnormal findings: Secondary | ICD-10-CM | POA: Diagnosis not present

## 2022-06-11 DIAGNOSIS — Z6830 Body mass index (BMI) 30.0-30.9, adult: Secondary | ICD-10-CM | POA: Diagnosis not present

## 2022-06-18 ENCOUNTER — Telehealth: Payer: Self-pay | Admitting: Family Medicine

## 2022-06-18 NOTE — Telephone Encounter (Signed)
Left message for patient to call back and schedule Medicare Annual Wellness Visit (AWV) either virtually or in office. Left  my jabber number [phone]   awvi 09/20/09 per [facility]  please schedule with Nurse Health Adviser   45 min for awv-i and in office appointments 30 min for awv-s  phone/virtual appointments  

## 2022-06-22 DIAGNOSIS — L570 Actinic keratosis: Secondary | ICD-10-CM | POA: Diagnosis not present

## 2022-06-22 DIAGNOSIS — Z08 Encounter for follow-up examination after completed treatment for malignant neoplasm: Secondary | ICD-10-CM | POA: Diagnosis not present

## 2022-06-22 DIAGNOSIS — Z09 Encounter for follow-up examination after completed treatment for conditions other than malignant neoplasm: Secondary | ICD-10-CM | POA: Diagnosis not present

## 2022-06-22 DIAGNOSIS — Z85828 Personal history of other malignant neoplasm of skin: Secondary | ICD-10-CM | POA: Diagnosis not present

## 2022-06-23 ENCOUNTER — Telehealth: Payer: Self-pay | Admitting: Family Medicine

## 2022-06-23 NOTE — Telephone Encounter (Signed)
Pt call and stated she is having lower back pain and want   dr.Fry to sent [id number] [place] [place]  [phone] Fax:  [phone]   Pt want a call back when it is sent.

## 2022-06-24 MED ORDER — METHYLPREDNISOLONE 4 MG PO TBPK: ORAL_TABLET | ORAL | 0 refills | Status: DC

## 2022-06-24 NOTE — Telephone Encounter (Signed)
I sent in a Medrol dose pack  

## 2022-06-24 NOTE — Telephone Encounter (Signed)
Spoke with patient to inform [facility]     Patient requested a work note with restrictions no lifting or squatting due to her back pain.    Okay for note?   If so how long should restrictions last.?

## 2022-06-25 NOTE — Telephone Encounter (Signed)
Please write such a note and make it last for 90 days. We can re-evaluate her at that time

## 2022-06-25 NOTE — Telephone Encounter (Signed)
Pt letter was sent via [place]

## 2022-07-12 ENCOUNTER — Other Ambulatory Visit: Payer: Self-pay | Admitting: Family Medicine

## 2022-07-12 NOTE — Telephone Encounter (Signed)
Ok to send refill, please advise

## 2022-07-20 DIAGNOSIS — M79674 Pain in right toe(s): Secondary | ICD-10-CM | POA: Diagnosis not present

## 2022-07-20 DIAGNOSIS — M79675 Pain in left toe(s): Secondary | ICD-10-CM | POA: Diagnosis not present

## 2022-07-20 DIAGNOSIS — B351 Tinea unguium: Secondary | ICD-10-CM | POA: Diagnosis not present

## 2022-07-21 ENCOUNTER — Encounter: Payer: Self-pay | Admitting: Family Medicine

## 2022-07-21 ENCOUNTER — Ambulatory Visit (INDEPENDENT_AMBULATORY_CARE_PROVIDER_SITE_OTHER): Payer: PPO | Admitting: Family Medicine

## 2022-07-21 VITALS — BP 108/70 | HR 97 | Temp 98.5°F | Ht 63.0 in | Wt 168.0 lb

## 2022-07-21 DIAGNOSIS — G8918 Other acute postprocedural pain: Secondary | ICD-10-CM

## 2022-07-21 DIAGNOSIS — M797 Fibromyalgia: Secondary | ICD-10-CM | POA: Diagnosis not present

## 2022-07-21 DIAGNOSIS — F1190 Opioid use, unspecified, uncomplicated: Secondary | ICD-10-CM | POA: Diagnosis not present

## 2022-07-21 DIAGNOSIS — J40 Bronchitis, not specified as acute or chronic: Secondary | ICD-10-CM | POA: Diagnosis not present

## 2022-07-21 MED ORDER — AZITHROMYCIN 250 MG PO TABS: ORAL_TABLET | ORAL | 0 refills | Status: DC

## 2022-07-21 MED ORDER — MORPHINE SULFATE ER 30 MG PO TBCR: 30.0000 mg | EXTENDED_RELEASE_TABLET | Freq: Two times a day (BID) | ORAL | 0 refills | Status: DC

## 2022-07-21 MED ORDER — OXYCODONE HCL 5 MG PO TABS: 5.0000 mg | ORAL_TABLET | Freq: Two times a day (BID) | ORAL | 0 refills | Status: DC | PRN

## 2022-07-21 MED ORDER — METHYLPREDNISOLONE 4 MG PO TBPK: ORAL_TABLET | ORAL | 0 refills | Status: DC

## 2022-07-21 MED ORDER — HYDROCODONE BIT-HOMATROP MBR 5-1.5 MG/5ML PO SOLN: 5.0000 mL | ORAL | 0 refills | Status: DC | PRN

## 2022-07-21 NOTE — Progress Notes (Signed)
   Subjective:    Patient ID: Ashley [name], female    DOB: 1960/12/28, 61 y.o.   MRN: [id number]  HPI Here for pain management. She has been doing fairly well with her body aches, though she has more problems in the colder weather. She had been thinking about applying for a different job where she could be off her feet, but this has not worked out. She also describes a week of chest tightness and a dry cough. No fever.    Review of Systems  Constitutional: Negative.   Respiratory:  Positive for cough, chest tightness and wheezing. Negative for shortness of breath.   Cardiovascular: Negative.   Musculoskeletal:  Positive for myalgias.       Objective:   Physical Exam Constitutional:      General: She is not in acute distress.    Appearance: Normal appearance.  Cardiovascular:     Rate and Rhythm: Normal rate and regular rhythm.     Pulses: Normal pulses.     Heart sounds: Normal heart sounds.  Pulmonary:     Effort: Pulmonary effort is normal.     Breath sounds: Wheezing present. No rhonchi or rales.  Neurological:     Mental Status: She is alert.           Assessment & Plan:  Pain management. Indication for chronic opioid: fibromyalgia  Medication and dose: MS Contin 30 mg and Oxycodone 5 mg  # pills per month: 60 and 60 Last UDS date: 10-20-21 Opioid Treatment Agreement signed (Y/N): 11-02-18 Opioid Treatment Agreement last reviewed with patient:  07-21-22 NCCSRS reviewed this encounter (include red flags): Yes Her pain meds were refilled. For the bronchitis, we will treat this with a Zpack and a Medrol dose pack.  [name], MD

## 2022-08-03 ENCOUNTER — Telehealth: Payer: Self-pay | Admitting: Family Medicine

## 2022-08-03 NOTE — Telephone Encounter (Signed)
Left message for patient to call back and schedule Medicare Annual Wellness Visit (AWV) either virtually or in office. Left  my Herbie Drape number [phone]   awvi 09/20/09 per [facility] please schedule with Nurse Health Adviser   45 min for awv-i and in office appointments 30 min for awv-s  phone/virtual appointments

## 2022-08-16 ENCOUNTER — Other Ambulatory Visit: Payer: Self-pay | Admitting: Family Medicine

## 2022-08-16 DIAGNOSIS — K219 Gastro-esophageal reflux disease without esophagitis: Secondary | ICD-10-CM

## 2022-08-17 ENCOUNTER — Other Ambulatory Visit: Payer: Self-pay

## 2022-08-25 ENCOUNTER — Other Ambulatory Visit: Payer: Self-pay | Admitting: Family Medicine

## 2022-09-06 ENCOUNTER — Telehealth: Payer: Self-pay | Admitting: Family Medicine

## 2022-09-06 NOTE — Telephone Encounter (Signed)
Left message for patient to call back and schedule Medicare Annual Wellness Visit (AWV) either virtually or in office. Left  my Herbie Drape number [phone]   awvi 09/20/09 per [place]  please schedule with Nurse Health Adviser   45 min for awv-i and in office appointments 30 min for awv-s  phone/virtual appointments

## 2022-09-07 DIAGNOSIS — H2513 Age-related nuclear cataract, bilateral: Secondary | ICD-10-CM | POA: Diagnosis not present

## 2022-09-07 DIAGNOSIS — M3501 Sicca syndrome with keratoconjunctivitis: Secondary | ICD-10-CM | POA: Diagnosis not present

## 2022-09-07 DIAGNOSIS — H5213 Myopia, bilateral: Secondary | ICD-10-CM | POA: Diagnosis not present

## 2022-09-25 IMAGING — CT CT ABD-PELV W/ CM
1 of 3 series · 14 of 32 positions shown, 19 images · IV contrast (iopamidol)
Comparison: 05/30/2008 abdominopelvic CT, report only.

CLINICAL DATA: 10 pound weight loss. Decreased appetite. Rectal
pain and pressure for 1 year. No history of cancer.

EXAM:
CT ABDOMEN AND PELVIS WITH CONTRAST
TECHNIQUE: Multidetector CT imaging of the abdomen and pelvis was performed
using the standard protocol following bolus administration of
intravenous contrast.
CONTRAST:  100mL EXOPX7-K44 IOPAMIDOL (EXOPX7-K44) INJECTION 61%

[Series 2: a/p w/ 5mm · axial · 0.92mm/px · z∈[-449,-29]mm · 14 of 94 slices shown, 19 images]
[im 5/94  soft-tissue]
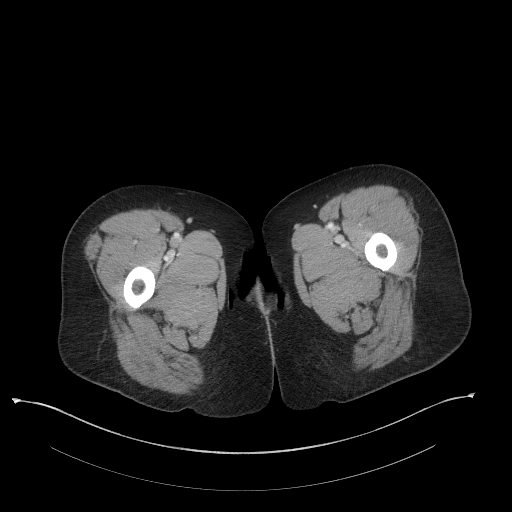
[im 5/94  bone]
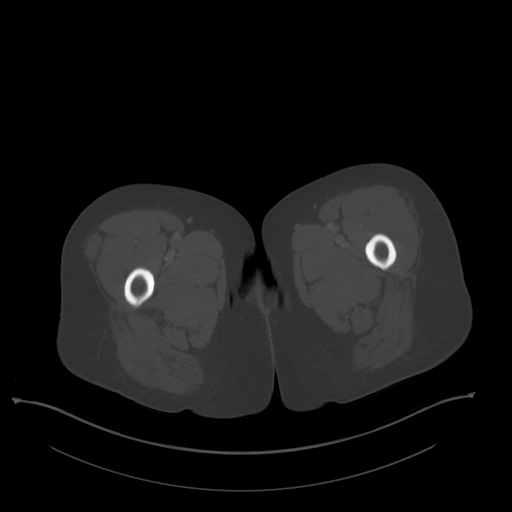
[im 15/94  soft-tissue]
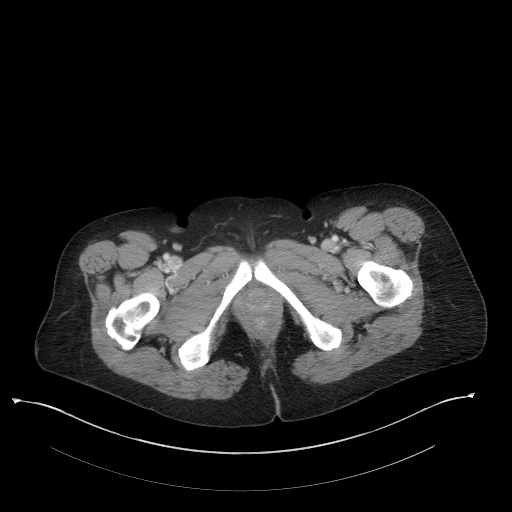
[im 20/94  soft-tissue]
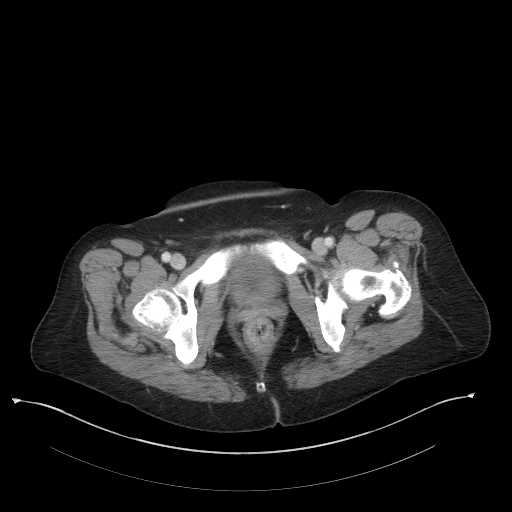
[im 25/94  soft-tissue]
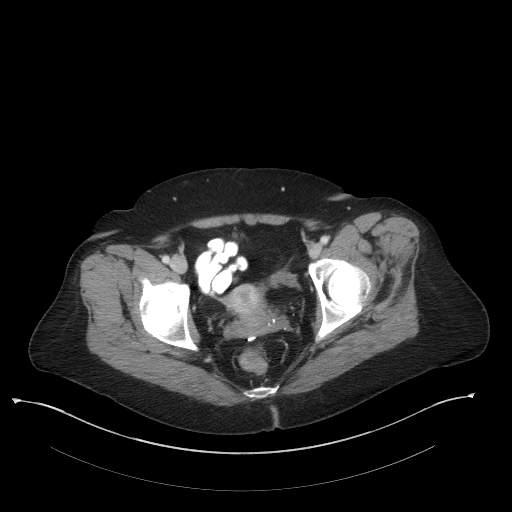
[im 35/94  soft-tissue]
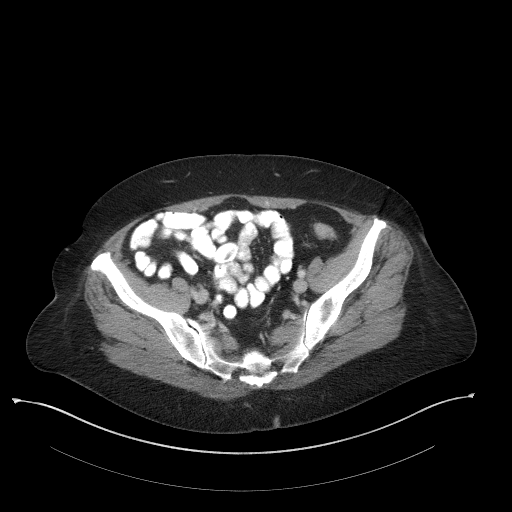
[im 40/94  soft-tissue]
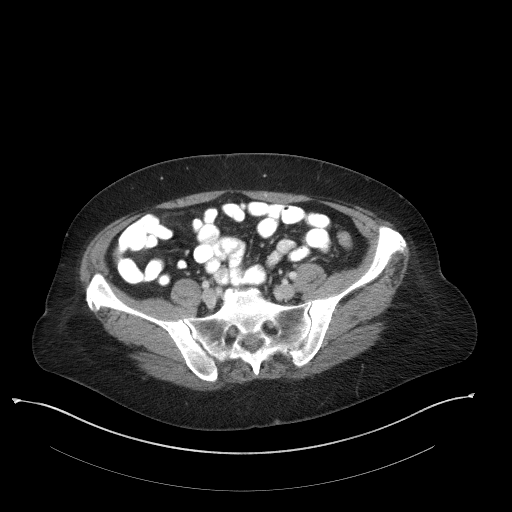
[im 49/94  soft-tissue]
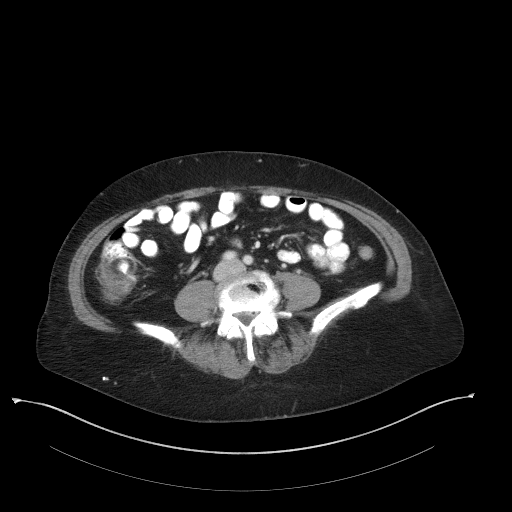
[im 54/94  soft-tissue]
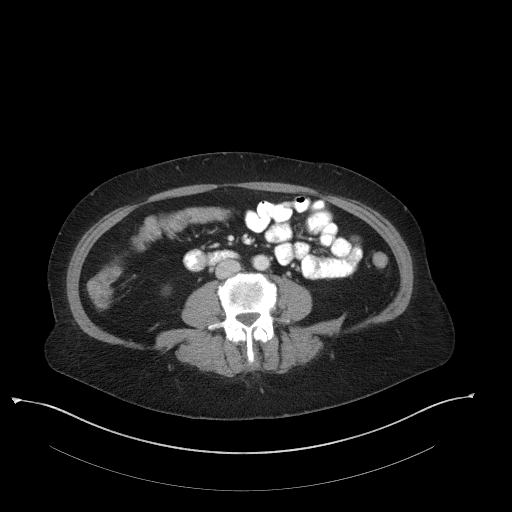
[im 59/94  soft-tissue]
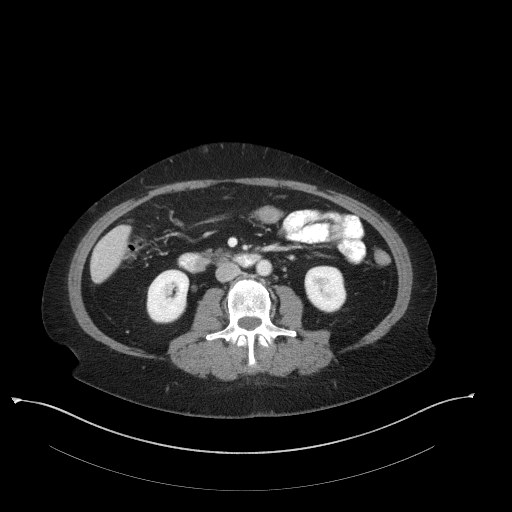
[im 59/94  bone]
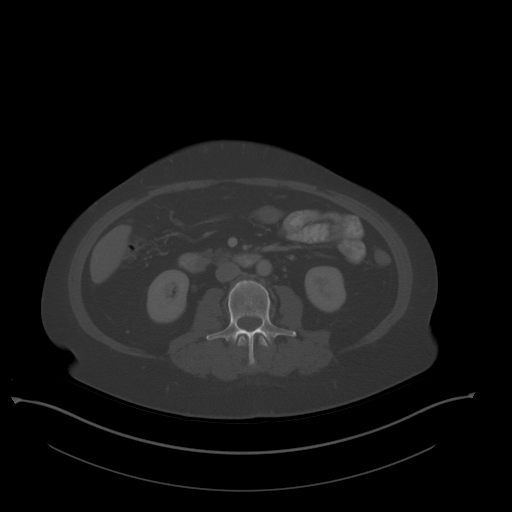
[im 69/94  soft-tissue]
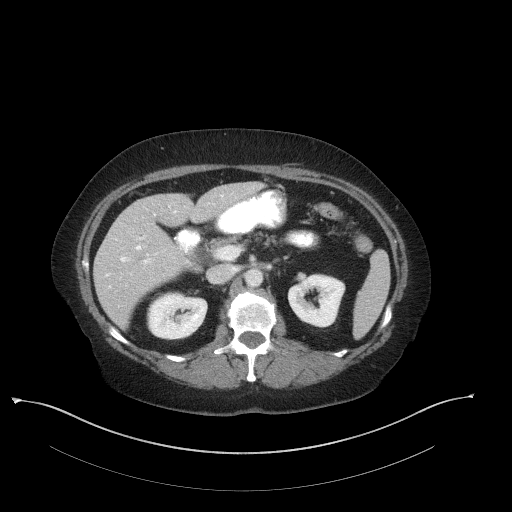
[im 74/94  soft-tissue]
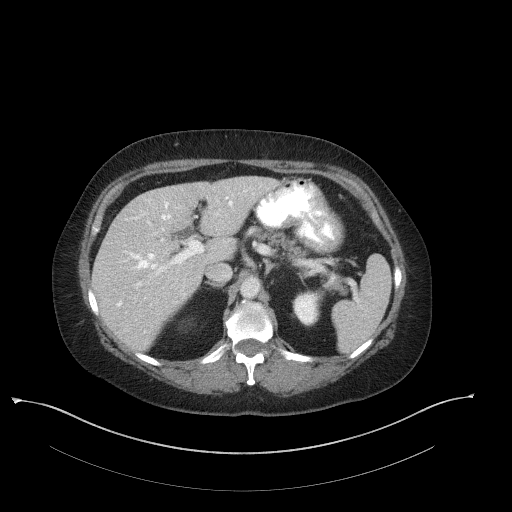
[im 74/94  lung]
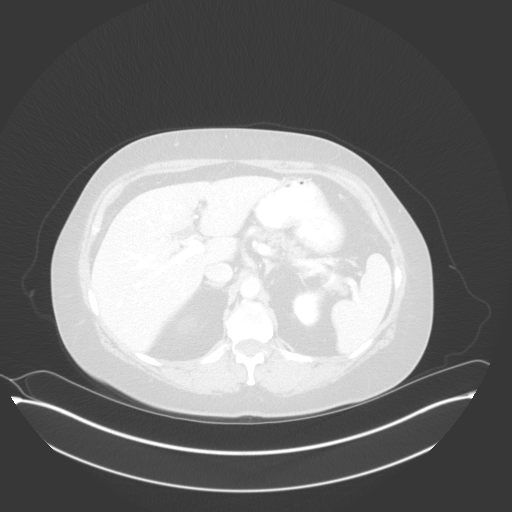
[im 79/94  soft-tissue]
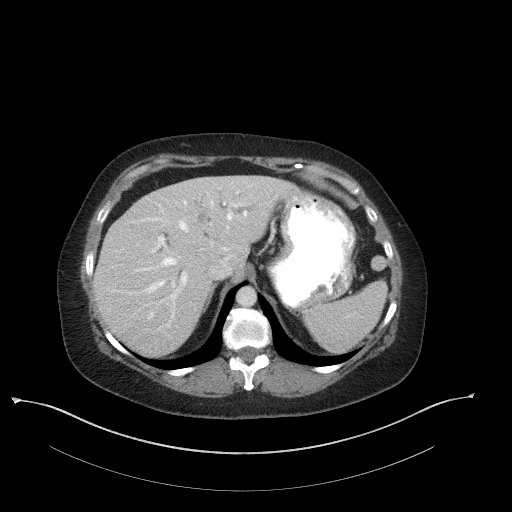
[im 79/94  lung]
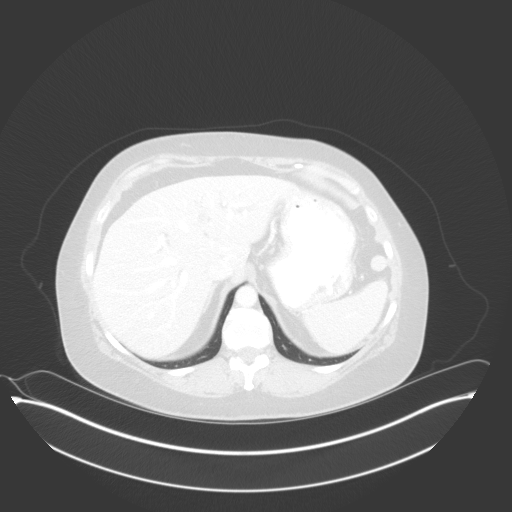
[im 84/94  lung]
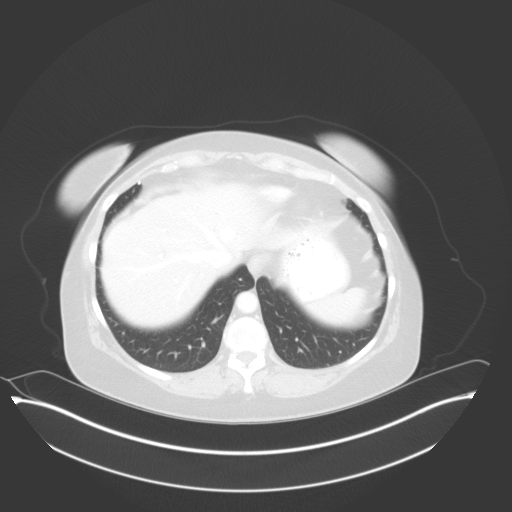
[im 89/94  soft-tissue]
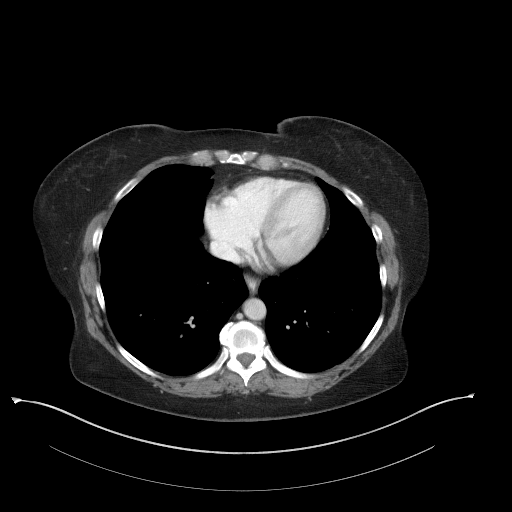
[im 89/94  lung]
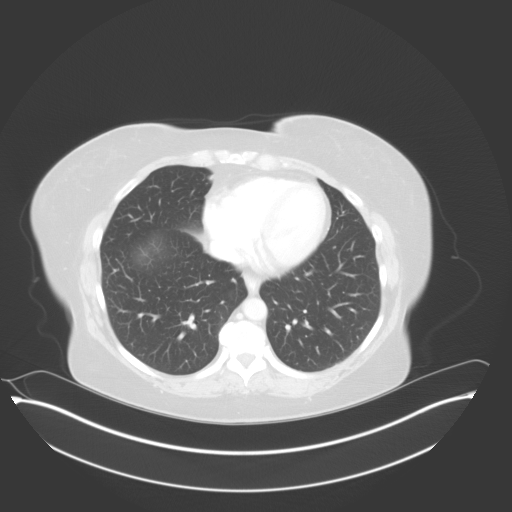

[14 of 32 positions shown; findings below may reference images not displayed]

FINDINGS: Lower chest: Clear lung bases. Normal heart size without pericardial
or pleural effusion.

Hepatobiliary: No focal liver lesion. Cholecystectomy. Mild to
moderate intrahepatic biliary duct dilatation for prior
cholecystectomy state. Common duct maximally 9 mm in the porta
hepatis on 58/5, tapering gradually within the pancreatic head. No
choledocholithiasis.

Pancreas: Normal, without mass or ductal dilatation.

Spleen: Normal in size, without focal abnormality.

Adrenals/Urinary Tract: Normal adrenal glands. Bilateral too small
to characterize renal lesions, likely cysts. Equivocal mild bladder
wall thickening versus underdistention.

Stomach/Bowel: Normal stomach, without wall thickening. Colon is
primarily underdistended. Normal terminal ileum. Appendectomy.
Normal small bowel.

Vascular/Lymphatic: Aortic atherosclerosis. No abdominopelvic
adenopathy.

Reproductive: Normal uterus and adnexa.

Other: No significant free fluid.  No free intraperitoneal air.

Musculoskeletal: Degenerative disc disease, most significant at
L4-5.
IMPRESSION: 1. Cholecystectomy. Mild intrahepatic biliary duct dilatation with
normal caliber common duct. Consider correlation with bilirubin
levels. If these are elevated, consider ERCP or MRCP.
2. Otherwise, no acute process or explanation for patient's
symptoms.
3.  Aortic Atherosclerosis ([id number]).
4. Equivocal bladder wall thickening which could be correlated with
urinalysis to exclude cystitis.

## 2022-10-04 ENCOUNTER — Other Ambulatory Visit: Payer: Self-pay | Admitting: Family Medicine

## 2022-10-07 DIAGNOSIS — F3181 Bipolar II disorder: Secondary | ICD-10-CM | POA: Diagnosis not present

## 2022-10-25 ENCOUNTER — Ambulatory Visit (INDEPENDENT_AMBULATORY_CARE_PROVIDER_SITE_OTHER): Payer: PPO | Admitting: Family Medicine

## 2022-10-25 ENCOUNTER — Encounter: Payer: Self-pay | Admitting: Family Medicine

## 2022-10-25 VITALS — BP 150/90 | HR 66 | Temp 98.1°F | Wt 168.6 lb

## 2022-10-25 DIAGNOSIS — M797 Fibromyalgia: Secondary | ICD-10-CM | POA: Diagnosis not present

## 2022-10-25 DIAGNOSIS — F1190 Opioid use, unspecified, uncomplicated: Secondary | ICD-10-CM | POA: Diagnosis not present

## 2022-10-25 DIAGNOSIS — G8929 Other chronic pain: Secondary | ICD-10-CM | POA: Diagnosis not present

## 2022-10-25 DIAGNOSIS — G8918 Other acute postprocedural pain: Secondary | ICD-10-CM

## 2022-10-25 MED ORDER — MORPHINE SULFATE ER 30 MG PO TBCR: 30.0000 mg | EXTENDED_RELEASE_TABLET | Freq: Two times a day (BID) | ORAL | 0 refills | Status: DC

## 2022-10-25 MED ORDER — OXYCODONE HCL 5 MG PO TABS: 5.0000 mg | ORAL_TABLET | Freq: Two times a day (BID) | ORAL | 0 refills | Status: DC | PRN

## 2022-10-25 MED ORDER — MORPHINE SULFATE ER 30 MG PO TBCR: 30.0000 mg | EXTENDED_RELEASE_TABLET | Freq: Two times a day (BID) | ORAL | 0 refills | Status: AC

## 2022-10-25 MED ORDER — METHOCARBAMOL 750 MG PO TABS: ORAL_TABLET | ORAL | 11 refills | Status: DC

## 2022-10-25 NOTE — Progress Notes (Signed)
   Subjective:    Patient ID: Ashley [name], female    DOB: 05/16/1961, 62 y.o.   MRN: [id number]  HPI Here for pain management. She is doing fairly well. She has started getting acupuncture 2-3 times a month, and this has been helpful for her pain.    Review of Systems  Constitutional: Negative.   Musculoskeletal:  Positive for myalgias.       Objective:   Physical Exam Constitutional:      Appearance: Normal appearance.  Neurological:     Mental Status: She is alert.           Assessment & Plan:  Pain management. Indication for chronic opioid: fibromyalgia  Medication and dose: MS Contin 30 mg and Oxycodone 5 mg  # pills per month: 60 and 60 Last UDS date: 10-25-22 Opioid Treatment Agreement signed (Y/N): 11-02-18 Opioid Treatment Agreement last reviewed with patient:  10-25-22 NCCSRS reviewed this encounter (include red flags): Yes Meds were refilled.  [name], MD

## 2022-10-28 LAB — DRUG MONITOR, PANEL 1, W/CONF, URINE
Alphahydroxyalprazolam: 578 ng/mL — ABNORMAL HIGH (ref <25)
Alphahydroxymidazolam: NEGATIVE ng/mL (ref <50)
Alphahydroxytriazolam: NEGATIVE ng/mL (ref <50)
Aminoclonazepam: NEGATIVE ng/mL (ref <25)
Amphetamine: NEGATIVE ng/mL (ref <250)
Amphetamines: NEGATIVE ng/mL (ref <500)
Barbiturates: NEGATIVE ng/mL (ref <300)
Benzodiazepines: POSITIVE ng/mL — AB (ref <100)
Cocaine Metabolite: NEGATIVE ng/mL (ref <150)
Codeine: NEGATIVE ng/mL (ref <50)
Creatinine: 263 mg/dL (ref >=20.0)
Hydrocodone: NEGATIVE ng/mL (ref <50)
Hydromorphone: 554 ng/mL — ABNORMAL HIGH (ref <50)
Hydroxyethylflurazepam: NEGATIVE ng/mL (ref <50)
Lorazepam: NEGATIVE ng/mL (ref <50)
Marijuana Metabolite: >5000 ng/mL — ABNORMAL HIGH (ref <5)
Marijuana Metabolite: POSITIVE ng/mL — AB (ref <20)
Methadone Metabolite: NEGATIVE ng/mL (ref <100)
Methamphetamine: NEGATIVE ng/mL (ref <250)
Morphine: >10000 ng/mL — ABNORMAL HIGH (ref <50)
Nordiazepam: NEGATIVE ng/mL (ref <50)
Norhydrocodone: NEGATIVE ng/mL (ref <50)
Noroxycodone: 8217 ng/mL — ABNORMAL HIGH (ref <50)
Opiates: POSITIVE ng/mL — AB (ref <100)
Oxazepam: NEGATIVE ng/mL (ref <50)
Oxidant: NEGATIVE ug/mL (ref <200)
Oxycodone: 667 ng/mL — ABNORMAL HIGH (ref <50)
Oxycodone: POSITIVE ng/mL — AB (ref <100)
Oxymorphone: 478 ng/mL — ABNORMAL HIGH (ref <50)
Phencyclidine: NEGATIVE ng/mL (ref <25)
Temazepam: NEGATIVE ng/mL (ref <50)
pH: 5.6 (ref 4.5–9.0)

## 2022-10-28 LAB — DM TEMPLATE

## 2022-10-29 ENCOUNTER — Other Ambulatory Visit: Payer: Self-pay | Admitting: Family Medicine

## 2022-11-10 ENCOUNTER — Telehealth: Payer: Self-pay | Admitting: Family Medicine

## 2022-11-10 NOTE — Telephone Encounter (Signed)
Called patient to schedule Medicare Annual Wellness Visit (AWV). Left message for patient to call back and schedule Medicare Annual Wellness Visit (AWV).  Last date of AWV:  due awvi 09/20/09 per [facility]   Please schedule an appointment at any time with [facility] or [facility].  If any questions, please contact me at [phone].  Thank you ,  Barkley Boards AWV direct phone # [phone]

## 2022-11-18 ENCOUNTER — Encounter: Payer: Self-pay | Admitting: Family Medicine

## 2022-11-19 ENCOUNTER — Other Ambulatory Visit: Payer: Self-pay

## 2022-11-19 MED ORDER — PREDNISONE 20 MG PO TABS: 20.0000 mg | ORAL_TABLET | Freq: Two times a day (BID) | ORAL | 0 refills | Status: DC | PRN

## 2022-11-19 MED ORDER — LOSARTAN POTASSIUM 50 MG PO TABS: 50.0000 mg | ORAL_TABLET | Freq: Every day | ORAL | 2 refills | Status: DC

## 2022-11-19 NOTE — Telephone Encounter (Signed)
Patient also calling in requesting prednisone for back pain, pain is getting worse as the week progresses. Pain on whole right side of back and radiating down right leg.   [facility] [id number] [address], [place] Phone: [phone]  Fax: [phone]

## 2022-11-19 NOTE — Telephone Encounter (Signed)
I agree we need to treat the BP. Call in Losartan 50 mg daily, #30 with 2 rf. Also call in Prednisone 20 mg to take BID as needed for pain, #60 with no rf

## 2022-11-29 ENCOUNTER — Other Ambulatory Visit: Payer: Self-pay | Admitting: Family Medicine

## 2022-12-20 ENCOUNTER — Other Ambulatory Visit: Payer: Self-pay | Admitting: Family Medicine

## 2022-12-21 ENCOUNTER — Other Ambulatory Visit: Payer: Self-pay | Admitting: Family Medicine

## 2022-12-22 DIAGNOSIS — L570 Actinic keratosis: Secondary | ICD-10-CM | POA: Diagnosis not present

## 2022-12-22 DIAGNOSIS — L821 Other seborrheic keratosis: Secondary | ICD-10-CM | POA: Diagnosis not present

## 2022-12-22 DIAGNOSIS — D225 Melanocytic nevi of trunk: Secondary | ICD-10-CM | POA: Diagnosis not present

## 2022-12-22 DIAGNOSIS — Z85828 Personal history of other malignant neoplasm of skin: Secondary | ICD-10-CM | POA: Diagnosis not present

## 2022-12-22 DIAGNOSIS — Z08 Encounter for follow-up examination after completed treatment for malignant neoplasm: Secondary | ICD-10-CM | POA: Diagnosis not present

## 2022-12-22 DIAGNOSIS — L578 Other skin changes due to chronic exposure to nonionizing radiation: Secondary | ICD-10-CM | POA: Diagnosis not present

## 2022-12-22 DIAGNOSIS — Z09 Encounter for follow-up examination after completed treatment for conditions other than malignant neoplasm: Secondary | ICD-10-CM | POA: Diagnosis not present

## 2022-12-22 DIAGNOSIS — L218 Other seborrheic dermatitis: Secondary | ICD-10-CM | POA: Diagnosis not present

## 2022-12-22 DIAGNOSIS — L718 Other rosacea: Secondary | ICD-10-CM | POA: Diagnosis not present

## 2022-12-22 DIAGNOSIS — L814 Other melanin hyperpigmentation: Secondary | ICD-10-CM | POA: Diagnosis not present

## 2022-12-28 DIAGNOSIS — F3181 Bipolar II disorder: Secondary | ICD-10-CM | POA: Diagnosis not present

## 2022-12-29 ENCOUNTER — Ambulatory Visit (INDEPENDENT_AMBULATORY_CARE_PROVIDER_SITE_OTHER): Payer: PPO | Admitting: Family Medicine

## 2022-12-29 ENCOUNTER — Encounter: Payer: Self-pay | Admitting: Family Medicine

## 2022-12-29 VITALS — BP 124/86 | HR 66 | Temp 98.3°F | Ht 61.75 in | Wt 167.6 lb

## 2022-12-29 DIAGNOSIS — Z0000 Encounter for general adult medical examination without abnormal findings: Secondary | ICD-10-CM | POA: Diagnosis not present

## 2022-12-29 LAB — LIPID PANEL
Cholesterol: 165 mg/dL (ref 0–200)
HDL: 52.7 mg/dL (ref >39.00)
LDL Cholesterol: 94 mg/dL (ref 0–99)
NonHDL: 112.57
Total CHOL/HDL Ratio: 3
Triglycerides: 92 mg/dL (ref 0.0–149.0)
VLDL: 18.4 mg/dL (ref 0.0–40.0)

## 2022-12-29 LAB — BASIC METABOLIC PANEL
BUN: 13 mg/dL (ref 6–23)
CO2: 28 mEq/L (ref 19–32)
Calcium: 9.3 mg/dL (ref 8.4–10.5)
Chloride: 102 mEq/L (ref 96–112)
Creatinine, Ser: 0.82 mg/dL (ref 0.40–1.20)
GFR: 78.09 mL/min (ref >60.00)
Glucose, Bld: 94 mg/dL (ref 70–99)
Potassium: 4.1 mEq/L (ref 3.5–5.1)
Sodium: 137 mEq/L (ref 135–145)

## 2022-12-29 LAB — CBC WITH DIFFERENTIAL/PLATELET
Basophils Absolute: 0 10*3/uL (ref 0.0–0.1)
Basophils Relative: 0.3 % (ref 0.0–3.0)
Eosinophils Absolute: 0 10*3/uL (ref 0.0–0.7)
Eosinophils Relative: 0.4 % (ref 0.0–5.0)
HCT: 39.7 % (ref 36.0–46.0)
Hemoglobin: 13.6 g/dL (ref 12.0–15.0)
Lymphocytes Relative: 21.7 % (ref 12.0–46.0)
Lymphs Abs: 1.8 10*3/uL (ref 0.7–4.0)
MCHC: 34.4 g/dL (ref 30.0–36.0)
MCV: 90.4 fl (ref 78.0–100.0)
Monocytes Absolute: 0.6 10*3/uL (ref 0.1–1.0)
Monocytes Relative: 7.7 % (ref 3.0–12.0)
Neutro Abs: 5.8 10*3/uL (ref 1.4–7.7)
Neutrophils Relative %: 69.9 % (ref 43.0–77.0)
Platelets: 273 10*3/uL (ref 150.0–400.0)
RBC: 4.39 Mil/uL (ref 3.87–5.11)
RDW: 13.1 % (ref 11.5–15.5)
WBC: 8.4 10*3/uL (ref 4.0–10.5)

## 2022-12-29 LAB — HEPATIC FUNCTION PANEL
ALT: 14 U/L (ref 0–35)
AST: 16 U/L (ref 0–37)
Albumin: 4.3 g/dL (ref 3.5–5.2)
Alkaline Phosphatase: 79 U/L (ref 39–117)
Bilirubin, Direct: 0.2 mg/dL (ref 0.0–0.3)
Total Bilirubin: 0.7 mg/dL (ref 0.2–1.2)
Total Protein: 6.6 g/dL (ref 6.0–8.3)

## 2022-12-29 LAB — HEMOGLOBIN A1C: Hgb A1c MFr Bld: 5.1 % (ref 4.6–6.5)

## 2022-12-29 LAB — TSH: TSH: 1.15 u[IU]/mL (ref 0.35–5.50)

## 2022-12-29 MED ORDER — LOSARTAN POTASSIUM 50 MG PO TABS: 50.0000 mg | ORAL_TABLET | Freq: Every day | ORAL | 3 refills | Status: DC

## 2022-12-29 NOTE — Progress Notes (Signed)
   Subjective:    Patient ID: Ashley [name], female    DOB: 1961/05/17, 62 y.o.   MRN: [id number]  HPI Here for a well exam. She is doing well in general.    Review of Systems  Constitutional: Negative.   HENT: Negative.    Eyes: Negative.   Respiratory: Negative.    Cardiovascular: Negative.   Gastrointestinal: Negative.   Genitourinary:  Negative for decreased urine volume, difficulty urinating, dyspareunia, dysuria, enuresis, flank pain, frequency, hematuria, pelvic pain and urgency.  Musculoskeletal:  Positive for back pain.  Skin: Negative.   Neurological: Negative.  Negative for headaches.  Psychiatric/Behavioral: Negative.         Objective:   Physical Exam Constitutional:      General: She is not in acute distress.    Appearance: Normal appearance. She is well-developed.  HENT:     Head: Normocephalic and atraumatic.     Right Ear: External ear normal.     Left Ear: External ear normal.     Nose: Nose normal.     Mouth/Throat:     Pharynx: No oropharyngeal exudate.  Eyes:     General: No scleral icterus.    Conjunctiva/sclera: Conjunctivae normal.     Pupils: Pupils are equal, round, and reactive to light.  Neck:     Thyroid: No thyromegaly.     Vascular: No JVD.  Cardiovascular:     Rate and Rhythm: Normal rate and regular rhythm.     Heart sounds: Normal heart sounds. No murmur heard.    No friction rub. No gallop.  Pulmonary:     Effort: Pulmonary effort is normal. No respiratory distress.     Breath sounds: Normal breath sounds. No wheezing or rales.  Chest:     Chest wall: No tenderness.  Abdominal:     General: Bowel sounds are normal. There is no distension.     Palpations: Abdomen is soft. There is no mass.     Tenderness: There is no abdominal tenderness. There is no guarding or rebound.  Musculoskeletal:        General: No tenderness. Normal range of motion.     Cervical back: Normal range of motion and neck supple.  Lymphadenopathy:      Cervical: No cervical adenopathy.  Skin:    General: Skin is warm and dry.     Findings: No erythema or rash.  Neurological:     Mental Status: She is alert and oriented to person, place, and time.     Cranial Nerves: No cranial nerve deficit.     Motor: No abnormal muscle tone.     Coordination: Coordination normal.     Deep Tendon Reflexes: Reflexes are normal and symmetric. Reflexes normal.  Psychiatric:        Behavior: Behavior normal.        Thought Content: Thought content normal.        Judgment: Judgment normal.           Assessment & Plan:  Well exam. We discussed diet and exercise. Get fasting labs. Gershon Crane, MD

## 2023-01-24 ENCOUNTER — Ambulatory Visit (INDEPENDENT_AMBULATORY_CARE_PROVIDER_SITE_OTHER): Payer: PPO | Admitting: Family Medicine

## 2023-01-24 ENCOUNTER — Encounter: Payer: Self-pay | Admitting: Family Medicine

## 2023-01-24 VITALS — BP 128/82 | HR 70 | Temp 98.1°F | Wt 167.4 lb

## 2023-01-24 DIAGNOSIS — G8918 Other acute postprocedural pain: Secondary | ICD-10-CM | POA: Diagnosis not present

## 2023-01-24 DIAGNOSIS — F1190 Opioid use, unspecified, uncomplicated: Secondary | ICD-10-CM | POA: Diagnosis not present

## 2023-01-24 DIAGNOSIS — M797 Fibromyalgia: Secondary | ICD-10-CM | POA: Diagnosis not present

## 2023-01-24 MED ORDER — MORPHINE SULFATE ER 30 MG PO TBCR: 30.0000 mg | EXTENDED_RELEASE_TABLET | Freq: Two times a day (BID) | ORAL | 0 refills | Status: DC | PRN

## 2023-01-24 MED ORDER — OXYCODONE HCL 5 MG PO TABS
5.0000 mg | ORAL_TABLET | Freq: Two times a day (BID) | ORAL | 0 refills | Status: DC | PRN
Start: 2023-01-24 — End: 2023-01-24

## 2023-01-24 MED ORDER — OXYCODONE HCL 5 MG PO TABS
5.0000 mg | ORAL_TABLET | Freq: Two times a day (BID) | ORAL | 0 refills | Status: DC | PRN
Start: 2023-02-23 — End: 2023-01-24

## 2023-01-24 MED ORDER — OXYCODONE HCL 5 MG PO TABS
5.0000 mg | ORAL_TABLET | Freq: Two times a day (BID) | ORAL | 0 refills | Status: DC | PRN
Start: 2023-03-25 — End: 2023-04-20

## 2023-01-24 NOTE — Progress Notes (Signed)
   Subjective:    Patient ID: Ashley [name], female    DOB: 20-May-1961, 62 y.o.   MRN: [id number]  HPI Here for pain management. She is doing well.    Review of Systems  Constitutional: Negative.   Musculoskeletal:  Positive for back pain.       Objective:   Physical Exam Constitutional:      Appearance: Normal appearance.  Neurological:     Mental Status: She is alert.           Assessment & Plan:  Pain management. Indication for chronic opioid: fibromyalgia  Medication and dose: MS Contin 30 mg and Oxycodone 5 mg # pills per month: 60 and 60 Last UDS date: 10-25-22 Opioid Treatment Agreement signed (Y/N): 11-02-18 Opioid Treatment Agreement last reviewed with patient:  01-24-23 N[facility]SRS reviewed this encounter (include red flags): Yes Meds were refilled. Gershon Crane, MD

## 2023-01-28 ENCOUNTER — Other Ambulatory Visit: Payer: Self-pay | Admitting: Family Medicine

## 2023-02-02 ENCOUNTER — Telehealth: Payer: Self-pay | Admitting: Family Medicine

## 2023-02-02 NOTE — Telephone Encounter (Signed)
Called patient to schedule Medicare Annual Wellness Visit (AWV). Left message for patient to call back and schedule Medicare Annual Wellness Visit (AWV).  Last date of AWV:  AWV-I per PA*LMETTO 09/20/09  Please schedule an appointment at any time with [facility] or hannah kim.  If any questions, please contact me at [phone].  Thank you ,  Mariana Wiederholt [facility] AWV direct phone # [phone] 

## 2023-02-14 ENCOUNTER — Other Ambulatory Visit: Payer: Self-pay | Admitting: Family Medicine

## 2023-02-21 ENCOUNTER — Other Ambulatory Visit: Payer: Self-pay | Admitting: Family Medicine

## 2023-02-21 DIAGNOSIS — K219 Gastro-esophageal reflux disease without esophagitis: Secondary | ICD-10-CM

## 2023-03-29 ENCOUNTER — Other Ambulatory Visit: Payer: Self-pay | Admitting: Family Medicine

## 2023-04-11 ENCOUNTER — Other Ambulatory Visit: Payer: Self-pay | Admitting: Family Medicine

## 2023-04-11 ENCOUNTER — Telehealth (INDEPENDENT_AMBULATORY_CARE_PROVIDER_SITE_OTHER): Payer: PPO | Admitting: Family

## 2023-04-11 VITALS — BP 143/99 | Ht 61.75 in | Wt 162.0 lb

## 2023-04-11 DIAGNOSIS — J069 Acute upper respiratory infection, unspecified: Secondary | ICD-10-CM | POA: Diagnosis not present

## 2023-04-11 DIAGNOSIS — J9801 Acute bronchospasm: Secondary | ICD-10-CM | POA: Diagnosis not present

## 2023-04-11 DIAGNOSIS — R0982 Postnasal drip: Secondary | ICD-10-CM

## 2023-04-11 MED ORDER — PROMETHAZINE-DM 6.25-15 MG/5ML PO SYRP
5.0000 mL | ORAL_SOLUTION | Freq: Every evening | ORAL | 0 refills | Status: DC | PRN
Start: 2023-04-11 — End: 2023-09-02

## 2023-04-11 MED ORDER — FLUTICASONE PROPIONATE 50 MCG/ACT NA SUSP
NASAL | 1 refills | Status: AC
Start: 2023-04-11 — End: ?

## 2023-04-11 MED ORDER — ALBUTEROL SULFATE HFA 108 (90 BASE) MCG/ACT IN AERS
2.0000 | INHALATION_SPRAY | RESPIRATORY_TRACT | 5 refills | Status: AC | PRN
Start: 2023-04-11 — End: ?

## 2023-04-11 MED ORDER — ALBUTEROL SULFATE (2.5 MG/3ML) 0.083% IN NEBU: 2.5000 mg | INHALATION_SOLUTION | RESPIRATORY_TRACT | 5 refills | Status: DC | PRN

## 2023-04-11 NOTE — Progress Notes (Signed)
MyChart Video Visit    Virtual Visit via Video Note   This format is felt to be most appropriate for this patient at this time. Physical exam was limited by quality of the video and audio technology used for the visit. CMA was able to get the patient set up on a video visit.  Patient location: Home. Patient and provider in visit Provider location: Office  I discussed the limitations of evaluation and management by telemedicine and the availability of in person appointments. The patient expressed understanding and agreed to proceed.  Visit Date: 04/11/2023  Today's healthcare provider: Dulce Sellar, NP     Subjective:   Patient ID: Ashley [name], female    DOB: [birth year], 62 y.o.   MRN: [id number]  Chief Complaint  Patient presents with   URI    sx for 3d    HPI URI sx:  Pt c/o dry Cough, Eyes burning, Nasal drainage, headaches and fatigue, Present since Friday. Denies fever, chest heaviness/tightness, sinus pain, sore throat & tested neg for Covid. Has tried dayquil/nyquil, which did not help sx. Reports hx of bronchitis, has nebulizer meds and rescue inhaler but are expired.   Assessment & Plan:  Viral upper respiratory tract infection- reports using Hydrocodone cough syrup in past; advised I can't send with her already on morphine & OXY, sending phenergan-dm, advised on use & SE. Also advised pt restart her Flonase, needs refill, as this can help her viral sx as well. Stop Dayquil during day, can take Nyquil at night.  -     Promethazine-DM; Take 5 mLs by mouth at bedtime as needed for cough.  Dispense: 120 mL; Refill: 0  Cold induced bronchospasm- refilling neb & inhaler, has not used in long time. Advised on use & SE.  -     Albuterol Sulfate HFA; Inhale 2 puffs into the lungs every 4 (four) hours as needed for wheezing or shortness of breath.  Dispense: 1 each; Refill: 5 -     Albuterol Sulfate; Take 3 mLs (2.5 mg total) by nebulization every 4 (four)  hours as needed for wheezing or shortness of breath.  Dispense: 75 mL; Refill: 5  3. Postnasal drip refilling, reminded pt on use & SE.  - fluticasone (FLONASE) 50 MCG/ACT nasal spray; 1 spray each nostril twice a day for 3 days, then reduce to 1 spray each nostril daily.  Dispense: 16 g; Refill: 1   Past Medical History:  Diagnosis Date   Allergy    Asthma    Bipolar disorder (HCC)    dr Orpha Bur    Chronic lower back pain    sees Dr. Delano Metz at [facility]    Depression    Fibromyalgia    dr Delano Metz [place]   GERD (gastroesophageal reflux disease)    Headache(784.0)    HTN (hypertension)    IBS (irritable bowel syndrome)    dr Ewing Schlein   Injury of lower back 09-23-05   Workers Comp case worker is Dwan Bolt (phone [phone] or fax [phone])   Reflex sympathetic dystrophy    left hand dr Cliffton Asters and dr Ethelene Hal    Past Surgical History:  Procedure Laterality Date   APPENDECTOMY     CARPAL TUNNEL RELEASE Bilateral    CERVICAL SPINE SURGERY     CHOLECYSTECTOMY     COLONOSCOPY  09/05/2020   per Dr. Ewing Schlein, clear, repeat in 5 yrs    ESOPHAGOGASTRODUODENOSCOPY  05/2015   per Dr. Ewing Schlein,  benign gastric polyps   left shouder  1996   separation, x 2   NASAL SINUS SURGERY     radiofrequency  ablations april 2010 to lower spine per dr Luis Abed     removal of Left great toenail Left 01/2016   TRIGGER FINGER RELEASE Right     Outpatient Medications Prior to Visit  Medication Sig Dispense Refill   alprazolam (XANAX) 2 MG tablet Take 1 tablet (2 mg total) by mouth 3 (three) times daily as needed. 90 tablet 5   buPROPion (WELLBUTRIN XL) 150 MG 24 hr tablet Take 150 mg by mouth daily.     CALCIUM-VITAMIN D PO Take 1 tablet by mouth daily.     diclofenac Sodium (VOLTAREN) 1 % GEL Apply 4 g topically 4 (four) times daily. 100 g 11   fluticasone (CUTIVATE) 0.05 % cream Apply topically as needed.     fluticasone (FLONASE) 50  MCG/ACT nasal spray Place 2 sprays into both nostrils daily. (Patient taking differently: Place 2 sprays into both nostrils as needed.) 16 g 6   Glucosamine-Chondroit-Vit C-Mn (GLUCOSAMINE CHONDR 1500 COMPLX PO) Take by mouth 2 (two) times daily.     ketoconazole (NIZORAL) 2 % cream Apply 1 Application topically 2 (two) times daily as needed for irritation. 60 g 5   losartan (COZAAR) 50 MG tablet Take 1 tablet (50 mg total) by mouth daily. 90 tablet 3   Magnesium 250 MG TABS Take 250 mg by mouth daily at 12 noon.     meloxicam (MOBIC) 15 MG tablet TAKE ONE TABLET BY MOUTH DAILY 90 tablet 3   methocarbamol (ROBAXIN) 750 MG tablet TAKE 1 TABLET BY MOUTH EVERY 6 HOURS AS NEEDED FOR MUSCLE SPASMS 120 tablet 11   metoCLOPramide (REGLAN) 5 MG tablet TAKE 1 TABLET BY MOUTH FOUR TIMES A DAY 120 tablet 3   morphine (MS CONTIN) 30 MG 12 hr tablet Take 1 tablet (30 mg total) by mouth every 12 (twelve) hours as needed for pain. 60 tablet 0   Multiple Vitamin (MULTIVITAMIN) capsule Take 1 capsule by mouth daily.     Omega-3 Fatty Acids (FISH OIL) 1200 MG CAPS Take by mouth daily at 2 PM.     omeprazole (PRILOSEC) 40 MG capsule TAKE 1 CAPSULE BY MOUTH EVERY MORNING 90 capsule 3   ondansetron (ZOFRAN) 8 MG tablet Take 1 tablet (8 mg total) by mouth every 6 (six) hours as needed for nausea or vomiting. 60 tablet 2   oxybutynin (DITROPAN) 5 MG tablet TAKE 1 TABLET BY MOUTH TWICE A DAY 180 tablet 1   oxyCODONE (OXY IR/ROXICODONE) 5 MG immediate release tablet Take 1 tablet (5 mg total) by mouth every 12 (twelve) hours as needed for severe pain. 60 tablet 0   potassium chloride (KLOR-CON) 10 MEQ tablet TAKE 1 TABLET BY MOUTH TWICE A DAY 180 tablet 0   risperiDONE (RISPERDAL) 2 MG tablet Take 2 mg by mouth at bedtime.     traZODone (DESYREL) 150 MG tablet Take 75 mg by mouth at bedtime.     valACYclovir (VALTREX) 1000 MG tablet Take 1,000 mg by mouth as needed.      No facility-administered medications prior to  visit.    Allergies  Allergen Reactions   Aspirin Anaphylaxis   Ciprofloxacin Nausea And Vomiting   Gabapentin Other (See Comments)    Sleep apnea   Levetiracetam Other (See Comments)    Universal body pain       Objective:   Physical Exam Vitals  and nursing note reviewed.  Constitutional:      General: Pt is not in acute distress.    Appearance: Normal appearance.  HENT:     Head: Normocephalic.  Pulmonary:     Effort: No respiratory distress.  Musculoskeletal:     Cervical back: Normal range of motion.  Skin:    General: Skin is dry.     Coloration: Skin is not pale.  Neurological:     Mental Status: Pt is alert and oriented to person, place, and time.  Psychiatric:        Mood and Affect: Mood normal.   BP (!) 143/99 Comment: pt reported  Ht 5' 1.75" (1.568 m)   Wt 162 lb (73.5 kg)   BMI 29.87 kg/m   Wt Readings from Last 3 Encounters:  04/11/23 162 lb (73.5 kg)  01/24/23 167 lb 6.4 oz (75.9 kg)  12/29/22 167 lb 9.6 oz (76 kg)        I discussed the assessment and treatment plan with the patient. The patient was provided an opportunity to ask questions and all were answered. The patient agreed with the plan and demonstrated an understanding of the instructions.   The patient was advised to call back or seek an in-person evaluation if the symptoms worsen or if the condition fails to improve as anticipated.  Dulce Sellar, NP [facility] [phone] (phone) [phone] (fax)  [facility]

## 2023-04-20 ENCOUNTER — Ambulatory Visit (INDEPENDENT_AMBULATORY_CARE_PROVIDER_SITE_OTHER): Payer: PPO | Admitting: Family Medicine

## 2023-04-20 ENCOUNTER — Encounter: Payer: Self-pay | Admitting: Family Medicine

## 2023-04-20 VITALS — BP 134/70 | HR 67 | Temp 98.3°F | Ht 61.75 in | Wt 162.0 lb

## 2023-04-20 DIAGNOSIS — G8918 Other acute postprocedural pain: Secondary | ICD-10-CM

## 2023-04-20 DIAGNOSIS — M461 Sacroiliitis, not elsewhere classified: Secondary | ICD-10-CM

## 2023-04-20 DIAGNOSIS — M797 Fibromyalgia: Secondary | ICD-10-CM | POA: Diagnosis not present

## 2023-04-20 DIAGNOSIS — F1190 Opioid use, unspecified, uncomplicated: Secondary | ICD-10-CM

## 2023-04-20 MED ORDER — MORPHINE SULFATE ER 30 MG PO TBCR: 30.0000 mg | EXTENDED_RELEASE_TABLET | Freq: Two times a day (BID) | ORAL | 0 refills | Status: DC | PRN

## 2023-04-20 MED ORDER — OXYCODONE HCL 5 MG PO TABS
5.0000 mg | ORAL_TABLET | Freq: Two times a day (BID) | ORAL | 0 refills | Status: DC | PRN
Start: 2023-04-20 — End: 2023-07-18

## 2023-04-20 MED ORDER — OXYCODONE HCL 5 MG PO TABS: 5.0000 mg | ORAL_TABLET | Freq: Two times a day (BID) | ORAL | 0 refills | Status: DC | PRN

## 2023-04-20 NOTE — Progress Notes (Signed)
   Subjective:    Patient ID: Ashley [name], female    DOB: 1960/12/19, 62 y.o.   MRN: [id number]  HPI Here for pain management. Her fibromyalgia has been stable, but she also developed a sharp pain in the right lower back one week ago. This does not radiate down the legs.    Review of Systems  Constitutional: Negative.   Musculoskeletal:  Positive for arthralgias and myalgias.       Objective:   Physical Exam Constitutional:      Appearance: Normal appearance.  Musculoskeletal:     Comments: She is tender over the right SI joint   Neurological:     Mental Status: She is alert.           Assessment & Plan:  Pain management.  Indication for chronic opioid: fibromyalgia Medication and dose: MS Contin 30 mg and Oxycodone 5 mg  # pills per month: 60 and 60 Last UDS date: 10-25-22 Opioid Treatment Agreement signed (Y/N): 11-02-18 Opioid Treatment Agreement last reviewed with patient:  04-20-23 NCCSRS reviewed this encounter (include red flags): Yes Meds were refilled. She also has a right sacroiliitis, and I suggested she apply Voltaren gel to this area QID as needed.  Gershon Crane, MD

## 2023-05-02 ENCOUNTER — Other Ambulatory Visit: Payer: Self-pay | Admitting: Family Medicine

## 2023-05-19 DIAGNOSIS — F3181 Bipolar II disorder: Secondary | ICD-10-CM | POA: Diagnosis not present

## 2023-05-26 ENCOUNTER — Other Ambulatory Visit: Payer: Self-pay | Admitting: Family

## 2023-05-26 DIAGNOSIS — J069 Acute upper respiratory infection, unspecified: Secondary | ICD-10-CM

## 2023-07-05 ENCOUNTER — Encounter: Payer: Self-pay | Admitting: Family Medicine

## 2023-07-05 ENCOUNTER — Ambulatory Visit: Payer: PPO | Admitting: Family Medicine

## 2023-07-05 VITALS — BP 138/84 | HR 85 | Temp 98.3°F | Resp 16 | Ht 61.75 in | Wt 160.1 lb

## 2023-07-05 DIAGNOSIS — M79604 Pain in right leg: Secondary | ICD-10-CM

## 2023-07-05 DIAGNOSIS — M545 Low back pain, unspecified: Secondary | ICD-10-CM

## 2023-07-05 DIAGNOSIS — I10 Essential (primary) hypertension: Secondary | ICD-10-CM | POA: Diagnosis not present

## 2023-07-05 DIAGNOSIS — G8929 Other chronic pain: Secondary | ICD-10-CM | POA: Diagnosis not present

## 2023-07-05 MED ORDER — PREDNISONE 20 MG PO TABS
ORAL_TABLET | ORAL | 0 refills | Status: AC
Start: 2023-07-05 — End: 2023-07-13

## 2023-07-05 NOTE — Progress Notes (Signed)
ACUTE VISIT Chief Complaint  Patient presents with   Back Pain    X a week, on the right side going down into Ashley [name] leg, feels like a ice pick.    Hip Pain        HPI: Ashley [name] is a 62 y.o. female with a PMHx significant for HTN, atherosclerosis of aorta, asthma, GERD, chronic pain, fibromyalgia, and depression here today complaining of back and hip pain as described above. Ashley [name] complains of right-sided back and posterior hip pain that started 1-2 weeks ago but worse for the past 2 days.  Back Pain This is a chronic problem. The current episode started 1 to 4 weeks ago. The problem has been gradually worsening since onset. The pain is present in the lumbar spine. The pain is moderate. The symptoms are aggravated by standing. Associated symptoms include leg pain. Pertinent negatives include no abdominal pain, bladder incontinence, bowel incontinence, chest pain, dysuria, fever, headaches, numbness, paresis, paresthesias, pelvic pain, perianal numbness, weakness or weight loss. Ashley [name] has tried muscle relaxant, NSAIDs, home exercises and analgesics for the symptoms. The treatment provided mild relief.   Ashley [name] describes Ashley [name] pain as like an icepick in Ashley [name] right buttock that radiates up to Ashley [name] back and down the back of Ashley [name] right leg. Ashley [name] rates the pain as a 6-7/10, and states it prohibited Ashley [name] from going to work yesterday.    Ashley [name] also mentions yesterday Ashley [name] began having an achy pain on the side and anterior aspect of right thigh.  Ashley [name] denies fever, chills, changes in Ashley [name] urine or bowel habits, or rash. No unusual activities or trauma.  Ashley [name] mentions Ashley [name] has seen PT before and performing similar stretches at home now Ashley [name] has also followed with orthopedics in the past.  Ashley [name] had Ashley [name] last lumbar MRI 03/27/21: Multiple disc space narrowing and facet arthropathy. Mild to moderate right foraminal stenosis and left moderate central stenosis.  Ashley [name] is currently taking Meloxicam 15 mg daily,  Methocarbamol 750 mg qid,MS Contin 30 mg bid,and Oxycodone 5 mg bid prn for chronic pain.  Ashley [name] BP is slightly elevated today. Ashley [name] says Ashley [name] checks regularly at home and it is usually 120s-130s/70s-80s. Ashley [name] takes Losartan 50 mg daily.  Review of Systems  Constitutional:  Positive for activity change. Negative for appetite change, chills, fever and weight loss.  Respiratory:  Negative for cough and shortness of breath.   Cardiovascular:  Negative for chest pain and leg swelling.  Gastrointestinal:  Negative for abdominal pain, bowel incontinence, nausea and vomiting.  Genitourinary:  Negative for bladder incontinence, dysuria and pelvic pain.  Musculoskeletal:  Positive for back pain.  Neurological:  Negative for weakness, numbness, headaches and paresthesias.  See other pertinent positives and negatives in HPI.  Current Outpatient Medications on File Prior to Visit  Medication Sig Dispense Refill   albuterol (PROVENTIL) (2.5 MG/3ML) 0.083% nebulizer solution Take 3 mLs (2.5 mg total) by nebulization every 4 (four) hours as needed for wheezing or shortness of breath. 75 mL 5   albuterol (VENTOLIN HFA) 108 (90 Base) MCG/ACT inhaler Inhale 2 puffs into the lungs every 4 (four) hours as needed for wheezing or shortness of breath. 1 each 5   alprazolam (XANAX) 2 MG tablet Take 1 tablet (2 mg total) by mouth 3 (three) times daily as needed. 90 tablet 5   buPROPion (WELLBUTRIN XL) 150 MG 24 hr tablet Take 150 mg by mouth daily.     CALCIUM-VITAMIN D PO Take 1 tablet by mouth daily.  diclofenac Sodium (VOLTAREN) 1 % GEL Apply 4 g topically 4 (four) times daily. 100 g 11   fluticasone (CUTIVATE) 0.05 % cream Apply topically as needed.     fluticasone (FLONASE) 50 MCG/ACT nasal spray 1 spray each nostril twice a day for 3 days, then reduce to 1 spray each nostril daily. 16 g 1   Glucosamine-Chondroit-Vit C-Mn (GLUCOSAMINE CHONDR 1500 COMPLX PO) Take by mouth 2 (two) times daily.     ketoconazole  (NIZORAL) 2 % cream Apply 1 Application topically 2 (two) times daily as needed for irritation. 60 g 5   losartan (COZAAR) 50 MG tablet Take 1 tablet (50 mg total) by mouth daily. 90 tablet 3   Magnesium 250 MG TABS Take 250 mg by mouth daily at 12 noon.     meloxicam (MOBIC) 15 MG tablet TAKE ONE TABLET BY MOUTH DAILY 90 tablet 3   methocarbamol (ROBAXIN) 750 MG tablet TAKE 1 TABLET BY MOUTH EVERY 6 HOURS AS NEEDED FOR MUSCLE SPASMS. 120 tablet 11   metoCLOPramide (REGLAN) 5 MG tablet TAKE 1 TABLET BY MOUTH FOUR TIMES A DAY 120 tablet 3   morphine (MS CONTIN) 30 MG 12 hr tablet Take 1 tablet (30 mg total) by mouth every 12 (twelve) hours as needed for pain. 60 tablet 0   morphine (MS CONTIN) 30 MG 12 hr tablet Take 1 tablet (30 mg total) by mouth every 12 (twelve) hours as needed for pain. 60 tablet 0   morphine (MS CONTIN) 30 MG 12 hr tablet Take 1 tablet (30 mg total) by mouth every 12 (twelve) hours as needed for pain. 60 tablet 0   Multiple Vitamin (MULTIVITAMIN) capsule Take 1 capsule by mouth daily.     Omega-3 Fatty Acids (FISH OIL) 1200 MG CAPS Take by mouth daily at 2 PM.     omeprazole (PRILOSEC) 40 MG capsule TAKE 1 CAPSULE BY MOUTH EVERY MORNING 90 capsule 3   ondansetron (ZOFRAN) 8 MG tablet Take 1 tablet (8 mg total) by mouth every 6 (six) hours as needed for nausea or vomiting. 60 tablet 2   oxybutynin (DITROPAN) 5 MG tablet TAKE 1 TABLET BY MOUTH TWICE A DAY 180 tablet 1   oxyCODONE (OXY IR/ROXICODONE) 5 MG immediate release tablet Take 1 tablet (5 mg total) by mouth every 12 (twelve) hours as needed for severe pain. 60 tablet 0   oxyCODONE (OXY IR/ROXICODONE) 5 MG immediate release tablet Take 1 tablet (5 mg total) by mouth every 12 (twelve) hours as needed for severe pain. 60 tablet 0   oxyCODONE (OXY IR/ROXICODONE) 5 MG immediate release tablet Take 1 tablet (5 mg total) by mouth every 12 (twelve) hours as needed for severe pain. 60 tablet 0   potassium chloride (KLOR-CON) 10 MEQ  tablet TAKE 1 TABLET BY MOUTH TWICE A DAY 180 tablet 0   promethazine-dextromethorphan (PROMETHAZINE-DM) 6.25-15 MG/5ML syrup Take 5 mLs by mouth at bedtime as needed for cough. 120 mL 0   risperiDONE (RISPERDAL) 2 MG tablet Take 2 mg by mouth at bedtime.     traZODone (DESYREL) 150 MG tablet Take 75 mg by mouth at bedtime.     valACYclovir (VALTREX) 1000 MG tablet Take 1,000 mg by mouth as needed.      No current facility-administered medications on file prior to visit.    Past Medical History:  Diagnosis Date   Allergy    Asthma    Bipolar disorder (HCC)    dr Orpha Bur    Chronic  lower back pain    sees Dr. Delano Metz at [facility]    Depression    Fibromyalgia    dr Delano Metz [place]   GERD (gastroesophageal reflux disease)    Headache(784.0)    HTN (hypertension)    IBS (irritable bowel syndrome)    dr Ewing Schlein   Injury of lower back 09-23-05   Workers Comp case worker is Dwan Bolt (phone [phone] or fax [phone])   Reflex sympathetic dystrophy    left hand dr Cliffton Asters and dr Ethelene Hal   Allergies  Allergen Reactions   Aspirin Anaphylaxis   Ciprofloxacin Nausea And Vomiting   Gabapentin Other (See Comments)    Sleep apnea   Levetiracetam Other (See Comments)    Universal body pain    Social History   Socioeconomic History   Marital status: Married    Spouse name: Thayer Ohm   Number of children: 0   Years of education: 16   Highest education level: Not on file  Occupational History    Comment: [employer]  Tobacco Use   Smoking status: Never   Smokeless tobacco: Never  Vaping Use   Vaping status: Never Used  Substance and Sexual Activity   Alcohol use: Yes    Alcohol/week: 2.0 standard drinks of alcohol    Types: 2 Standard drinks or equivalent per week    Comment: occ, 1-2 4 days/week   Drug use: No   Sexual activity: Not on file  Other Topics Concern   Not on file  Social History Narrative    Lives with husband   Caffeine- sodas, 1-2 daily   Social Determinants of Health   Financial Resource Strain: Not on file  Food Insecurity: Not on file  Transportation Needs: Not on file  Physical Activity: Not on file  Stress: Not on file  Social Connections: Not on file   Vitals:   07/05/23 1129  BP: 138/84  Pulse: 85  Resp: 16  Temp: 98.3 F (36.8 C)  SpO2: 98%   Body mass index is 29.52 kg/m.  Physical Exam Vitals and nursing note reviewed.  Constitutional:      General: Ashley [name] is not in acute distress.    Appearance: Ashley [name] is well-developed.  HENT:     Head: Normocephalic and atraumatic.  Eyes:     Conjunctiva/sclera: Conjunctivae normal.  Cardiovascular:     Rate and Rhythm: Normal rate and regular rhythm.     Pulses:          Dorsalis pedis pulses are 2+ on the right side and 2+ on the left side.     Heart sounds: No murmur heard. Pulmonary:     Effort: Pulmonary effort is normal. No respiratory distress.     Breath sounds: Normal breath sounds.  Abdominal:     Palpations: Abdomen is soft. There is no mass.     Tenderness: There is no abdominal tenderness.  Musculoskeletal:     Thoracic back: Tenderness present.     Lumbar back: Tenderness present.     Comments: Right SLR elicits right-sided back pain.  Skin:    General: Skin is warm.     Findings: No erythema or rash.  Neurological:     General: No focal deficit present.     Mental Status: Ashley [name] is alert and oriented to person, place, and time.     Deep Tendon Reflexes:     Reflex Scores:      Patellar reflexes are 2+ on the  right side and 2+ on the left side.    Comments: Antalgic gait, not assisted.  Psychiatric:        Mood and Affect: Mood and affect normal.   ASSESSMENT AND PLAN:  Ms. Furber was seen today for back and hip pain.   Chronic bilateral low back pain, unspecified whether sciatica present Reporting new radiation to RLE. Currently on chronic opioid and NSAID treatment for pain as  well as Methocarbamol 750 mg qid. I do not think imaging is needed today. Instructed about warning signs.  Pain of right lower extremity No associated numbness or tingling. Palpable DP pulses palpable. ? Radiculopathy. Prednisone recommended, side effects discussed, instructed to take med with breakfast. F/U in 4-6 weeks if pain has not resolved.  -     predniSONE; 3 tabs for 3 days, 2 tabs for 3 days, 1 tabs for 3 days, and 1/2 tab for 3 days. Take tables together with breakfast.  Dispense: 20 tablet; Refill: 0  Essential hypertension SBP mildly elevated. NSAID's side effects discussed. Home BP readings lower, continue monitoring BP. Continue same Losartan same dose.  Return if symptoms worsen or fail to improve.  I, Rolla Etienne Wierda, acting as a scribe for Lavalle Skoda [place], MD., have documented all relevant documentation on the behalf of Avery Klingbeil [place], MD, as directed by  Valori Hollenkamp [place], MD while in the presence of Areatha Kalata [place], MD.   I, Jermany Rimel [place], MD, have reviewed all documentation for this visit. The documentation on 07/05/23 for the exam, diagnosis, procedures, and orders are all accurate and complete.  Katerra Ingman G. [place], MD  [facility]. [place] office.

## 2023-07-05 NOTE — Patient Instructions (Signed)
A few things to remember from today's visit:  Chronic bilateral low back pain without sciatica  Pain of right lower extremity Continue monitoring blood pressure at home. Take prednisone with breakfast and not at the same time that you take Meloxicam. Follow with PCP or orthopedist of still having problem in 4-6 weeks, before if worse.  If you need refills for medications you take chronically, please call your pharmacy. Do not use My Chart to request refills or for acute issues that need immediate attention. If you send a my chart message, it may take a few days to be addressed, specially if I am not in the office.  Please be sure medication list is accurate. If a new problem present, please set up appointment sooner than planned today.

## 2023-07-06 ENCOUNTER — Telehealth: Payer: PPO | Admitting: Family Medicine

## 2023-07-18 ENCOUNTER — Encounter: Payer: Self-pay | Admitting: Family Medicine

## 2023-07-18 ENCOUNTER — Ambulatory Visit (INDEPENDENT_AMBULATORY_CARE_PROVIDER_SITE_OTHER): Payer: PPO | Admitting: Family Medicine

## 2023-07-18 VITALS — BP 110/70 | HR 72 | Temp 98.2°F | Wt 161.0 lb

## 2023-07-18 DIAGNOSIS — G8918 Other acute postprocedural pain: Secondary | ICD-10-CM

## 2023-07-18 DIAGNOSIS — I700 Atherosclerosis of aorta: Secondary | ICD-10-CM | POA: Diagnosis not present

## 2023-07-18 DIAGNOSIS — M797 Fibromyalgia: Secondary | ICD-10-CM | POA: Diagnosis not present

## 2023-07-18 DIAGNOSIS — F1190 Opioid use, unspecified, uncomplicated: Secondary | ICD-10-CM | POA: Diagnosis not present

## 2023-07-18 DIAGNOSIS — M47816 Spondylosis without myelopathy or radiculopathy, lumbar region: Secondary | ICD-10-CM | POA: Diagnosis not present

## 2023-07-18 MED ORDER — MORPHINE SULFATE ER 30 MG PO TBCR: 30.0000 mg | EXTENDED_RELEASE_TABLET | Freq: Two times a day (BID) | ORAL | 0 refills | Status: DC | PRN

## 2023-07-18 MED ORDER — ATORVASTATIN CALCIUM 10 MG PO TABS: 10.0000 mg | ORAL_TABLET | Freq: Every day | ORAL | 3 refills | Status: DC

## 2023-07-18 MED ORDER — OXYCODONE HCL 5 MG PO TABS
5.0000 mg | ORAL_TABLET | Freq: Two times a day (BID) | ORAL | 0 refills | Status: DC | PRN
Start: 2023-07-20 — End: 2023-10-20

## 2023-07-18 MED ORDER — OXYCODONE HCL 5 MG PO TABS: 5.0000 mg | ORAL_TABLET | Freq: Two times a day (BID) | ORAL | 0 refills | Status: DC | PRN

## 2023-07-18 NOTE — Progress Notes (Signed)
   Subjective:    Patient ID: Ashley [name], female    DOB: 23-Jul-1961, 62 y.o.   MRN: [id number]  HPI Here for pain management, and for several other items. Her fibromyalgia has been fairly stable, but she has had more problems with low back pain for the past 3 months. The pain is sharp. It starts on the right side of the lower back and it radiates down the right leg. She saw Dr. [place] on 07-05-23 for this, and she was treated with a Prednisone taper staring with 60 mg a day. She already has Methocarbamol at home. Today she says the pain has improved a bit, but it is still a problem. She saw Dr. Ranell Patrick for low back pain a few years ago, and he ordered a lumbar spine MRI on 03-27-21. This showed bugling discs at L3-4. L4-5, and L5-S1. It also showed moderate to severe foraminal stenoses in these areas with same facet arthropathy as well. She took PT at that time with only mild improvement. The last question she asks is what is "aortic atherosclerosis"? She saw this listed on her problem list and she wanta to know where it came from. I see this was mentioned in the radiologist's report from a CT scan of her abdomen and pelvis ordered by Dr. Ewing Schlein on 09-01-21. Her BP has been well controlled and she does not have diabetes. She does not smoke. She has never taken a statin, even though we have recommended this, and she takes red yeast rice extract instead. Her LDL on 12-29-22 was 94.    Review of Systems  Constitutional: Negative.   Respiratory: Negative.    Cardiovascular: Negative.   Musculoskeletal:  Positive for back pain and myalgias.       Objective:   Physical Exam Constitutional:      Appearance: Normal appearance.  Musculoskeletal:     Comments: She is tender on the right side of the lower spine. ROM is limited by pain.   Neurological:     Mental Status: She is alert.           Assessment & Plan:  Pain management.  Indication for chronic opioid: fibromyalgia  Medication and  dose: MS Contin 30 mg and Oxycodone 5 mg # pills per month: 60 and 60 Last UDS date: 10-25-22 Opioid Treatment Agreement signed (Y/N): 11-02-18 Opioid Treatment Agreement last reviewed with patient:  07-18-23 [facility] reviewed this encounter (include red flags): Yes Her pain meds were refilled. For the low back pain, we will refer her to Neurosurgery to evaluate. For the aortic atherosclerosis, we will start her on Lipitor 10 mg daily. Refer to Cardiology to evaluate further.  Gershon Crane, MD

## 2023-07-22 ENCOUNTER — Encounter: Payer: Self-pay | Admitting: Family Medicine

## 2023-07-23 ENCOUNTER — Ambulatory Visit
Admission: EM | Admit: 2023-07-23 | Discharge: 2023-07-23 | Disposition: A | Discharge status: Home / Self Care | Payer: PPO | Attending: Emergency Medicine | Admitting: Emergency Medicine

## 2023-07-23 DIAGNOSIS — H1031 Unspecified acute conjunctivitis, right eye: Secondary | ICD-10-CM

## 2023-07-23 DIAGNOSIS — H00011 Hordeolum externum right upper eyelid: Secondary | ICD-10-CM | POA: Diagnosis not present

## 2023-07-23 MED ORDER — POLYMYXIN B-TRIMETHOPRIM 10000-0.1 UNIT/ML-% OP SOLN: 1.0000 [drp] | Freq: Four times a day (QID) | OPHTHALMIC | 0 refills | Status: DC

## 2023-07-23 MED ORDER — POLYMYXIN B-TRIMETHOPRIM 10000-0.1 UNIT/ML-% OP SOLN: 1.0000 [drp] | Freq: Four times a day (QID) | OPHTHALMIC | 0 refills | Status: AC

## 2023-07-23 NOTE — ED Provider Notes (Signed)
Renaldo Fiddler    CSN: [id number] Arrival date & time: 07/23/23  0859      History   Chief Complaint Chief Complaint  Patient presents with   Eye Problem    HPI Ashley [name] is a 62 y.o. female.  Patient presents with 2 day history of right upper eyelid bump that is red, swollen, painful, draining.  Her right eye was matted shut this morning due to drainage.  No eye injury, change in vision, fever, or other symptoms.  No trauma.  Patient wears glasses.  The history is provided by the patient and medical records.    Past Medical History:  Diagnosis Date   Allergy    Asthma    Bipolar disorder (HCC)    dr Orpha Bur    Chronic lower back pain    sees Dr. Delano Metz at [facility]    Depression    Fibromyalgia    dr Delano Metz    GERD (gastroesophageal reflux disease)    Headache(784.0)    HTN (hypertension)    IBS (irritable bowel syndrome)    dr Ewing Schlein   Injury of lower back 09-23-05   [employer] case worker is Dwan Bolt (phone [phone] or fax [phone])   Reflex sympathetic dystrophy    left hand dr Cliffton Asters and dr Ethelene Hal    Patient Active Problem List   Diagnosis Date Noted   Delayed gastric emptying 12/14/2021   Atherosclerosis of aorta (HCC) 10/16/2021   COVID-19 virus infection 03/18/2021   Trochanteric bursitis of left hip 05/04/2017   Lumbar spondylosis 07/10/2016   Acute postoperative pain 05/25/2016   Lumbar facet syndrome (Location of Primary Source of Pain) (Bilateral) (R>L) 12/02/2015   Chronic pain 12/02/2015   Long term current use of opiate analgesic 12/02/2015   Long term prescription opiate use 12/02/2015   Opiate use (112.5 MME/Day) 12/02/2015   Encounter for therapeutic drug level monitoring 12/02/2015   Pain management 12/02/2015   HTN (hypertension) 08/15/2013   Plantar fasciitis 01/06/2009   Essential hypertension 10/07/2008   GERD 08/30/2007   Overactive  bladder 08/14/2007   Genital herpes 03/13/2007   Depression 03/13/2007   Allergic rhinitis 03/13/2007   Asthma 03/13/2007   IBS 03/13/2007   Fibromyalgia 03/13/2007    Past Surgical History:  Procedure Laterality Date   APPENDECTOMY     CARPAL TUNNEL RELEASE Bilateral    CERVICAL SPINE SURGERY     CHOLECYSTECTOMY     COLONOSCOPY  09/05/2020   per Dr. Ewing Schlein, clear, repeat in 5 yrs    ESOPHAGOGASTRODUODENOSCOPY  05/2015   per Dr. Ewing Schlein, benign gastric polyps   left shouder  1996   separation, x 2   NASAL SINUS SURGERY     radiofrequency  ablations april 2010 to lower spine per dr Luis Abed     removal of Left great toenail Left 01/2016   TRIGGER FINGER RELEASE Right     OB History   No obstetric history on file.      Home Medications    Prior to Admission medications   Medication Sig Start Date End Date Taking? Authorizing Provider  albuterol (PROVENTIL) (2.5 MG/3ML) 0.083% nebulizer solution Take 3 mLs (2.5 mg total) by nebulization every 4 (four) hours as needed for wheezing or shortness of breath. 04/11/23   Dulce Sellar, NP  albuterol (VENTOLIN HFA) 108 (90 Base) MCG/ACT inhaler Inhale 2 puffs into the lungs every 4 (four) hours as needed for wheezing or shortness of breath. 04/11/23  Dulce Sellar, NP  alprazolam Prudy Feeler) 2 MG tablet Take 1 tablet (2 mg total) by mouth 3 (three) times daily as needed. 02/12/11   Nelwyn Salisbury, MD  atorvastatin (LIPITOR) 10 MG tablet Take 1 tablet (10 mg total) by mouth daily. 07/18/23   Nelwyn Salisbury, MD  buPROPion (WELLBUTRIN XL) 150 MG 24 hr tablet Take 150 mg by mouth daily.    [provider]  CALCIUM-VITAMIN D PO Take 1 tablet by mouth daily.    [provider]  diclofenac Sodium (VOLTAREN) 1 % GEL Apply 4 g topically 4 (four) times daily. 04/27/21   Nelwyn Salisbury, MD  fluticasone (CUTIVATE) 0.05 % cream Apply topically as needed. 10/13/21   [provider]  fluticasone (FLONASE) 50 MCG/ACT nasal  spray 1 spray each nostril twice a day for 3 days, then reduce to 1 spray each nostril daily. 04/11/23   Dulce Sellar, NP  Glucosamine-Chondroit-Vit C-Mn (GLUCOSAMINE CHONDR 1500 COMPLX PO) Take by mouth 2 (two) times daily.    [provider]  ketoconazole (NIZORAL) 2 % cream Apply 1 Application topically 2 (two) times daily as needed for irritation. 04/20/22   Nelwyn Salisbury, MD  losartan (COZAAR) 50 MG tablet Take 1 tablet (50 mg total) by mouth daily. 12/29/22   Nelwyn Salisbury, MD  Magnesium 250 MG TABS Take 250 mg by mouth daily at 12 noon.    [provider]  meloxicam (MOBIC) 15 MG tablet TAKE ONE TABLET BY MOUTH DAILY 08/25/22   Nelwyn Salisbury, MD  methocarbamol (ROBAXIN) 750 MG tablet TAKE 1 TABLET BY MOUTH EVERY 6 HOURS AS NEEDED FOR MUSCLE SPASMS. 04/12/23   Nelwyn Salisbury, MD  metoCLOPramide (REGLAN) 5 MG tablet TAKE 1 TABLET BY MOUTH FOUR TIMES A DAY 02/21/23   Nelwyn Salisbury, MD  morphine (MS CONTIN) 30 MG 12 hr tablet Take 1 tablet (30 mg total) by mouth every 12 (twelve) hours as needed for pain. 07/20/23   Nelwyn Salisbury, MD  morphine (MS CONTIN) 30 MG 12 hr tablet Take 1 tablet (30 mg total) by mouth every 12 (twelve) hours as needed for pain. 07/20/23   Nelwyn Salisbury, MD  morphine (MS CONTIN) 30 MG 12 hr tablet Take 1 tablet (30 mg total) by mouth every 12 (twelve) hours as needed for pain. 07/20/23   Nelwyn Salisbury, MD  Multiple Vitamin (MULTIVITAMIN) capsule Take 1 capsule by mouth daily.    [provider]  Omega-3 Fatty Acids (FISH OIL) 1200 MG CAPS Take by mouth daily at 2 PM.    [provider]  omeprazole (PRILOSEC) 40 MG capsule TAKE 1 CAPSULE BY MOUTH EVERY MORNING 11/30/22   Nelwyn Salisbury, MD  ondansetron (ZOFRAN) 8 MG tablet Take 1 tablet (8 mg total) by mouth every 6 (six) hours as needed for nausea or vomiting. 12/11/21   Nelwyn Salisbury, MD  oxybutynin (DITROPAN) 5 MG tablet TAKE 1 TABLET BY MOUTH TWICE A DAY 03/29/23   Nelwyn Salisbury, MD   oxyCODONE (OXY IR/ROXICODONE) 5 MG immediate release tablet Take 1 tablet (5 mg total) by mouth every 12 (twelve) hours as needed for severe pain (pain score 7-10). 07/20/23   Nelwyn Salisbury, MD  oxyCODONE (OXY IR/ROXICODONE) 5 MG immediate release tablet Take 1 tablet (5 mg total) by mouth every 12 (twelve) hours as needed for severe pain (pain score 7-10). 07/20/23   Nelwyn Salisbury, MD  oxyCODONE (OXY IR/ROXICODONE) 5 MG  immediate release tablet Take 1 tablet (5 mg total) by mouth every 12 (twelve) hours as needed for severe pain (pain score 7-10). 07/20/23   Nelwyn Salisbury, MD  potassium chloride (KLOR-CON) 10 MEQ tablet TAKE 1 TABLET BY MOUTH TWICE A DAY 05/02/23   Nelwyn Salisbury, MD  promethazine-dextromethorphan (PROMETHAZINE-DM) 6.25-15 MG/5ML syrup Take 5 mLs by mouth at bedtime as needed for cough. 04/11/23   Dulce Sellar, NP  Red Yeast Rice Extract (RED YEAST RICE PO) Take by mouth daily at 12 noon.    [provider]  risperiDONE (RISPERDAL) 2 MG tablet Take 2 mg by mouth at bedtime.    [provider]  traZODone (DESYREL) 150 MG tablet Take 75 mg by mouth at bedtime.    [provider]  trimethoprim-polymyxin b (POLYTRIM) ophthalmic solution Place 1 drop into both eyes 4 (four) times daily for 7 days. 07/23/23 07/30/23  Mickie Bail, NP  valACYclovir (VALTREX) 1000 MG tablet Take 1,000 mg by mouth as needed.     [provider]    Family History Family History  Problem Relation Age of Onset   Hypertension Mother    Cancer Father        lung   Cancer Maternal Aunt    Dementia Maternal Grandmother    Stroke Maternal Grandmother    Dementia Maternal Grandfather    Heart attack Maternal Grandfather    Alzheimer's disease Paternal Grandmother    Heart attack Paternal Grandfather     Social History Social History   Tobacco Use   Smoking status: Never   Smokeless tobacco: Never  Vaping Use   Vaping status: Never Used  Substance Use  Topics   Alcohol use: Yes    Alcohol/week: 2.0 standard drinks of alcohol    Types: 2 Standard drinks or equivalent per week    Comment: occ, 1-2 4 days/week   Drug use: No     Allergies   Aspirin, Ciprofloxacin, Gabapentin, and Levetiracetam   Review of Systems Review of Systems  Constitutional:  Negative for chills and fever.  HENT:  Negative for ear pain and sore throat.   Eyes:  Positive for pain, discharge and redness. Negative for visual disturbance.  Respiratory:  Negative for cough and shortness of breath.      Physical Exam Triage Vital Signs ED Triage Vitals  Encounter Vitals Group     BP      Systolic BP Percentile      Diastolic BP Percentile      Pulse      Resp      Temp      Temp src      SpO2      Weight      Height      Head Circumference      Peak Flow      Pain Score      Pain Loc      Pain Education      Exclude from Growth Chart    No data found.  Updated Vital Signs BP 131/72   Pulse 100   Temp 97.7 F (36.5 C)   Resp 18   SpO2 98%   Visual Acuity Right Eye Distance: 20/20 Left Eye Distance: 20/25 Bilateral Distance: 20/16  Right Eye Near:   Left Eye Near:    Bilateral Near:     Physical Exam Constitutional:      General: She is not in acute distress. HENT:  Right Ear: Tympanic membrane normal.     Left Ear: Tympanic membrane normal.     Nose: Nose normal.     Mouth/Throat:     Mouth: Mucous membranes are moist.     Pharynx: Oropharynx is clear.  Eyes:     General: Vision grossly intact.     Extraocular Movements: Extraocular movements intact.     Conjunctiva/sclera:     Right eye: Right conjunctiva is injected.     Pupils: Pupils are equal, round, and reactive to light.      Comments: Stye on right upper eyelid which has small amount of purulent drainage. Right conjunctiva injected.    Cardiovascular:     Rate and Rhythm: Normal rate and regular rhythm.     Heart sounds: Normal heart sounds.  Pulmonary:      Effort: Pulmonary effort is normal. No respiratory distress.     Breath sounds: Normal breath sounds.  Neurological:     Mental Status: She is alert.      UC Treatments / Results  Labs (all labs ordered are listed, but only abnormal results are displayed) Labs Reviewed - No data to display  EKG   Radiology No results found.  Procedures Procedures (including critical care time)  Medications Ordered in UC Medications - No data to display  Initial Impression / Assessment and Plan / UC Course  I have reviewed the triage vital signs and the nursing notes.  Pertinent labs & imaging results that were available during my care of the patient were reviewed by me and considered in my medical decision making (see chart for details).    Hordeolum of right upper eyelid, conjunctivitis.  Treating with Polytrim eyedrops.  Education provided on stye and conjunctivitis.  Instructed patient to follow-up with her eye care provider or PCP.  ED precautions discussed.  Patient agrees to plan of care.   Final Clinical Impressions(s) / UC Diagnoses   Final diagnoses:  Hordeolum externum of right upper eyelid  Acute bacterial conjunctivitis of right eye     Discharge Instructions      Use the antibiotic eyedrops as prescribed.  Use warm compresses as directed.    Follow-up with your eye care provider or primary care provider.    Go to the emergency department if you have acute eye pain, changes in your vision, or other concerning symptoms.        ED Prescriptions     Medication Sig Dispense Auth. Provider   trimethoprim-polymyxin b (POLYTRIM) ophthalmic solution  (Status: Discontinued) Place 1 drop into both eyes 4 (four) times daily for 7 days. 10 mL Mickie Bail, NP   trimethoprim-polymyxin b (POLYTRIM) ophthalmic solution Place 1 drop into both eyes 4 (four) times daily for 7 days. 10 mL Mickie Bail, NP      PDMP not reviewed this encounter.   Mickie Bail, NP 07/23/23  [phone]

## 2023-07-23 NOTE — Discharge Instructions (Addendum)
Use the antibiotic eyedrops as prescribed.  Use warm compresses as directed.    Follow-up with your eye care provider or primary care provider.    Go to the emergency department if you have acute eye pain, changes in your vision, or other concerning symptoms.

## 2023-07-23 NOTE — ED Triage Notes (Signed)
Patient to Urgent Care with complaints of right sided eye pain/ irritation.   Reports eye started started feeling sore on Thursday. Throughout the day yesterday eye became more irritated. Woke up this morning with her eye matted shut w/ drainage.

## 2023-07-25 NOTE — Telephone Encounter (Signed)
She needs an exam for this

## 2023-07-26 NOTE — Telephone Encounter (Signed)
Pt notified via My Chart

## 2023-07-27 ENCOUNTER — Encounter: Payer: Self-pay | Admitting: Family Medicine

## 2023-08-04 ENCOUNTER — Other Ambulatory Visit: Payer: Self-pay | Admitting: Family Medicine

## 2023-08-10 ENCOUNTER — Other Ambulatory Visit: Payer: Self-pay | Admitting: Neurosurgery

## 2023-08-10 DIAGNOSIS — M5416 Radiculopathy, lumbar region: Secondary | ICD-10-CM | POA: Diagnosis not present

## 2023-08-10 DIAGNOSIS — Z6829 Body mass index (BMI) 29.0-29.9, adult: Secondary | ICD-10-CM | POA: Diagnosis not present

## 2023-08-15 ENCOUNTER — Other Ambulatory Visit: Payer: Self-pay | Admitting: Family Medicine

## 2023-08-15 DIAGNOSIS — K219 Gastro-esophageal reflux disease without esophagitis: Secondary | ICD-10-CM

## 2023-08-24 ENCOUNTER — Other Ambulatory Visit: Payer: PPO

## 2023-08-25 ENCOUNTER — Ambulatory Visit (INDEPENDENT_AMBULATORY_CARE_PROVIDER_SITE_OTHER): Payer: PPO

## 2023-08-25 DIAGNOSIS — Z23 Encounter for immunization: Secondary | ICD-10-CM

## 2023-08-29 ENCOUNTER — Other Ambulatory Visit: Payer: Self-pay | Admitting: Family Medicine

## 2023-09-01 DIAGNOSIS — L578 Other skin changes due to chronic exposure to nonionizing radiation: Secondary | ICD-10-CM | POA: Diagnosis not present

## 2023-09-02 ENCOUNTER — Ambulatory Visit: Payer: PPO | Attending: Cardiovascular Disease | Admitting: Cardiovascular Disease

## 2023-09-02 ENCOUNTER — Encounter: Payer: Self-pay | Admitting: Cardiovascular Disease

## 2023-09-02 VITALS — BP 138/84 | HR 60 | Ht 62.0 in | Wt 161.0 lb

## 2023-09-02 DIAGNOSIS — E7800 Pure hypercholesterolemia, unspecified: Secondary | ICD-10-CM

## 2023-09-02 DIAGNOSIS — I10 Essential (primary) hypertension: Secondary | ICD-10-CM

## 2023-09-02 DIAGNOSIS — I700 Atherosclerosis of aorta: Secondary | ICD-10-CM | POA: Diagnosis not present

## 2023-09-02 NOTE — Patient Instructions (Signed)
Medication Instructions:  No changes *If you need a refill on your cardiac medications before your next appointment, please call your pharmacy*  Follow-Up: At [facility], you and your health needs are our priority.  As part of our continuing mission to provide you with exceptional heart care, we have created designated Provider Care Teams.  These Care Teams include your primary Cardiologist (physician) and Advanced Practice Providers (APPs -  Physician Assistants and Nurse Practitioners) who all work together to provide you with the care you need, when you need it.  We recommend signing up for the patient portal called "MyChart".  Sign up information is provided on this After Visit Summary.  MyChart is used to connect with patients for Virtual Visits (Telemedicine).  Patients are able to view lab/test results, encounter notes, upcoming appointments, etc.  Non-urgent messages can be sent to your provider as well.   To learn more about what you can do with MyChart, go to [email or web address].    Your next appointment:    Follow up as needed  Provider:   Dr Sallyanne Kuster

## 2023-09-02 NOTE — Progress Notes (Signed)
Cardiology Office Note:    Date:  09/02/2023   ID:  Corliss Skains, DOB 1961-03-14, MRN [id number]  PCP:  Nelwyn Salisbury, MD   [facility] Providers Cardiologist:  None     Referring MD: Nelwyn Salisbury, MD   Chief Complaint  Patient presents with   Consult  Ashley [name] is a 62 y.o. female who is being seen today for the evaluation of aortic atherosclerosis at the request of Nelwyn Salisbury, MD.   History of Present Illness:    Ashley [name] is a 62 y.o. female with a hx of hypertension, mild hypercholesterolemia, GERD, depression/bipolar disorder, reflex sympathetic dystrophy, referred in consultation for diagnosis of aortic atherosclerosis.  From a cardiovascular point of view she is asymptomatic.The patient specifically denies any chest pain at rest or with exertion, dyspnea at rest or with exertion, orthopnea, paroxysmal nocturnal dyspnea, syncope, palpitations, focal neurological deficits, intermittent claudication, lower extremity edema, unexplained weight gain, cough, hemoptysis or wheezing.  Aortic atherosclerosis is described on CT of the abdomen and pelvis performed in December 2022.  We reviewed the images from that study together today.  I scrolled carefully through all the cross-sectional views and reconstructed views of the visualized portions of the aorta and its branches.  I do not see any meaningful calcified plaque in the abdominal aorta, the bottom half of the descending thoracic aorta, the celiac artery, superior mesenteric artery, bilateral renal arteries, bilateral iliac and femoral arteries.  The heart is not entirely included in the images and there is motion artifact, but there is also no visible coronary artery calcification.  There is a family history of relatively early onset myocardial infarction in both of her grandfathers who had myocardial infarction's in their 92s.  Her father died of cancer at age 60.  Her mother has received the  MitraClip but does not have coronary disease.  [name] does not have diabetes mellitus.  Hemoglobin A1c earlier this year was 5.1%.  She does have hypercholesterolemia but on atorvastatin 10 mg daily her LDL cholesterol is 94 and her HDL is 53.  She does not smoke.  She is on antihypertensive medications with good blood pressure control.  Past Medical History:  Diagnosis Date   Allergy    Asthma    Bipolar disorder (HCC)    dr Orpha Bur    Chronic lower back pain    sees Dr. Delano Metz at [facility]    Depression    Fibromyalgia    dr Delano Metz [place]   GERD (gastroesophageal reflux disease)    Headache(784.0)    HTN (hypertension)    IBS (irritable bowel syndrome)    dr Ewing Schlein   Injury of lower back 09-23-05   Workers Comp case worker is Dwan Bolt (phone [phone] or fax [phone])   Reflex sympathetic dystrophy    left hand dr Cliffton Asters and dr Ethelene Hal    Past Surgical History:  Procedure Laterality Date   APPENDECTOMY     CARPAL TUNNEL RELEASE Bilateral    CERVICAL SPINE SURGERY     CHOLECYSTECTOMY     COLONOSCOPY  09/05/2020   per Dr. Ewing Schlein, clear, repeat in 5 yrs    ESOPHAGOGASTRODUODENOSCOPY  05/2015   per Dr. Ewing Schlein, benign gastric polyps   left shouder  1996   separation, x 2   NASAL SINUS SURGERY     radiofrequency  ablations april 2010 to lower spine per dr Luis Abed     removal of Left great toenail  Left 01/2016   TRIGGER FINGER RELEASE Right     Current Medications: Current Meds  Medication Sig   alprazolam (XANAX) 2 MG tablet Take 1 tablet (2 mg total) by mouth 3 (three) times daily as needed.   atorvastatin (LIPITOR) 10 MG tablet Take 1 tablet (10 mg total) by mouth daily.   buPROPion (WELLBUTRIN XL) 300 MG 24 hr tablet Take 300 mg by mouth daily.   CALCIUM-VITAMIN D PO Take 1 tablet by mouth daily.   fluticasone (FLONASE) 50 MCG/ACT nasal spray 1 spray each nostril twice a day for 3 days, then reduce to  1 spray each nostril daily.   Glucosamine-Chondroit-Vit C-Mn (GLUCOSAMINE CHONDR 1500 COMPLX PO) Take by mouth 2 (two) times daily.   losartan (COZAAR) 50 MG tablet Take 1 tablet (50 mg total) by mouth daily.   Magnesium 250 MG TABS Take 250 mg by mouth daily at 12 noon.   meloxicam (MOBIC) 15 MG tablet TAKE 1 TABLET BY MOUTH DAILY   methocarbamol (ROBAXIN) 750 MG tablet Take by mouth 2 (two) times daily.   metoCLOPramide (REGLAN) 5 MG tablet TAKE 1 TABLET BY MOUTH FOUR TIMES A DAY   morphine (MS CONTIN) 30 MG 12 hr tablet Take 30 mg by mouth every 12 (twelve) hours.   Multiple Vitamin (MULTIVITAMIN) capsule Take 1 capsule by mouth daily.   Omega-3 Fatty Acids (FISH OIL) 1200 MG CAPS Take by mouth daily at 2 PM.   omeprazole (PRILOSEC) 40 MG capsule TAKE 1 CAPSULE BY MOUTH EVERY MORNING   oxybutynin (DITROPAN) 5 MG tablet TAKE 1 TABLET BY MOUTH TWICE A DAY   oxyCODONE (OXY IR/ROXICODONE) 5 MG immediate release tablet Take 1 tablet (5 mg total) by mouth every 12 (twelve) hours as needed for severe pain (pain score 7-10).   potassium chloride (KLOR-CON) 10 MEQ tablet TAKE 1 TABLET BY MOUTH TWICE A DAY   risperiDONE (RISPERDAL) 2 MG tablet Take 2 mg by mouth at bedtime.   traZODone (DESYREL) 150 MG tablet Take 75 mg by mouth at bedtime.   valACYclovir (VALTREX) 1000 MG tablet Take 1,000 mg by mouth as needed.      Allergies:   Aspirin, Ciprofloxacin, Gabapentin, and Levetiracetam   Social History   Socioeconomic History   Marital status: Married    Spouse name: Thayer Ohm   Number of children: 0   Years of education: 16   Highest education level: Not on file  Occupational History    Comment: [employer]  Tobacco Use   Smoking status: Never   Smokeless tobacco: Never  Vaping Use   Vaping status: Never Used  Substance and Sexual Activity   Alcohol use: Yes    Alcohol/week: 2.0 standard drinks of alcohol    Types: 2 Standard drinks or equivalent per week    Comment: occ, 1-2 4  days/week   Drug use: No   Sexual activity: Not on file  Other Topics Concern   Not on file  Social History Narrative   Lives with husband   Caffeine- sodas, 1-2 daily   Social Drivers of Corporate investment banker Strain: Not on file  Food Insecurity: Not on file  Transportation Needs: Not on file  Physical Activity: Not on file  Stress: Not on file  Social Connections: Not on file     Family History: The patient's family history includes Alzheimer's disease in her paternal grandmother; Cancer in her father and maternal aunt; Dementia in her maternal grandfather and maternal grandmother;  Heart attack in her maternal grandfather and paternal grandfather; Hypertension in her mother; Stroke in her maternal grandmother.  ROS:   Please see the history of present illness.     All other systems reviewed and are negative.  EKGs/Labs/Other Studies Reviewed:    The following studies were reviewed today: CT abdomen 09/01/2021  EKG Interpretation Date/Time:  Friday September 02 2023 15:50:54 EST Ventricular Rate:  60 PR Interval:  154 QRS Duration:  104 QT Interval:  416 QTC Calculation: 416 R Axis:   -15  Text Interpretation: Normal sinus rhythm Normal ECG When compared with ECG of 30-May-2008 02:38, No significant change was found Confirmed by Davelle Anselmi [phone]) on 09/02/2023 4:02:20 PM    Recent Labs: 12/29/2022: ALT 14; BUN 13; Creatinine, Ser 0.82; Hemoglobin 13.6; Platelets 273.0; Potassium 4.1; Sodium 137; TSH 1.15  Recent Lipid Panel    Component Value Date/Time   CHOL 165 12/29/2022 1120   TRIG 92.0 12/29/2022 1120   TRIG 136 07/11/2006 1515   HDL 52.70 12/29/2022 1120   CHOLHDL 3 12/29/2022 1120   VLDL 18.4 12/29/2022 1120   LDLCALC 94 12/29/2022 1120   LDLCALC 122 (H) 07/14/2020 1128   LDLDIRECT 117.0 01/14/2016 1136     Risk Assessment/Calculations:                Physical Exam:    VS:  BP 138/84   Pulse 60   Ht 5\' 2"  (1.575 m)   Wt 161 lb  (73 kg)   SpO2 95%   BMI 29.45 kg/m     Wt Readings from Last 3 Encounters:  09/02/23 161 lb (73 kg)  07/18/23 161 lb (73 kg)  07/05/23 160 lb 2 oz (72.6 kg)     GEN: Overweight, well nourished, well developed in no acute distress HEENT: Normal NECK: No JVD; No carotid bruits LYMPHATICS: No lymphadenopathy CARDIAC: RRR, no murmurs, rubs, gallops RESPIRATORY:  Clear to auscultation without rales, wheezing or rhonchi  ABDOMEN: Soft, non-tender, non-distended MUSCULOSKELETAL:  No edema; No deformity  SKIN: Warm and dry NEUROLOGIC:  Alert and oriented x 3 PSYCHIATRIC:  Normal affect   ASSESSMENT:    1. Atherosclerosis of aorta (HCC)   2. Essential hypertension   3. Hypercholesterolemia    PLAN:    In order of problems listed above:  Atherosclerosis of the aorta: This was described in the report of her abdominal CT from December 2022 but on my personal careful review of the images I really do not see any calcified plaque in the aorta or any of its branches down to the level of the femoral arteries.  If there is any atherosclerosis it is minimal.  Her risk factors are all well addressed.  I do not think any change in therapy is recommended. HLP: Based on the current data I would recommend a target LDL cholesterol less than 865, which is achieved on the current medications. HTN: Her blood pressure was borderline high today at 138/84, but just 2 months ago when she saw Dr. Clent Ridges her blood pressure was 110/70.  Continue the same medications.           Medication Adjustments/Labs and Tests Ordered: Current medicines are reviewed at length with the patient today.  Concerns regarding medicines are outlined above.  Orders Placed This Encounter  Procedures   EKG 12-Lead   No orders of the defined types were placed in this encounter.   Patient Instructions  Medication Instructions:  No changes *If you need a refill  on your cardiac medications before your next appointment, please  call your pharmacy*  Follow-Up: At [facility], you and your health needs are our priority.  As part of our continuing mission to provide you with exceptional heart care, we have created designated Provider Care Teams.  These Care Teams include your primary Cardiologist (physician) and Advanced Practice Providers (APPs -  Physician Assistants and Nurse Practitioners) who all work together to provide you with the care you need, when you need it.  We recommend signing up for the patient portal called "MyChart".  Sign up information is provided on this After Visit Summary.  MyChart is used to connect with patients for Virtual Visits (Telemedicine).  Patients are able to view lab/test results, encounter notes, upcoming appointments, etc.  Non-urgent messages can be sent to your provider as well.   To learn more about what you can do with MyChart, go to [email or web address].    Your next appointment:    Follow up as needed  Provider:   Dr Royann Shivers    Signed, Thurmon Fair, MD  09/02/2023 7:15 PM    [facility]

## 2023-09-05 DIAGNOSIS — F3181 Bipolar II disorder: Secondary | ICD-10-CM | POA: Diagnosis not present

## 2023-09-07 DIAGNOSIS — Z01419 Encounter for gynecological examination (general) (routine) without abnormal findings: Secondary | ICD-10-CM | POA: Diagnosis not present

## 2023-09-07 DIAGNOSIS — Z6829 Body mass index (BMI) 29.0-29.9, adult: Secondary | ICD-10-CM | POA: Diagnosis not present

## 2023-09-07 DIAGNOSIS — Z1231 Encounter for screening mammogram for malignant neoplasm of breast: Secondary | ICD-10-CM | POA: Diagnosis not present

## 2023-09-07 DIAGNOSIS — N952 Postmenopausal atrophic vaginitis: Secondary | ICD-10-CM | POA: Diagnosis not present

## 2023-09-12 ENCOUNTER — Ambulatory Visit
Admission: RE | Admit: 2023-09-12 | Discharge: 2023-09-12 | Disposition: A | Discharge status: Home / Self Care | Payer: PPO | Source: Ambulatory Visit | Attending: Neurosurgery | Admitting: Neurosurgery

## 2023-09-12 DIAGNOSIS — M545 Low back pain, unspecified: Secondary | ICD-10-CM | POA: Diagnosis not present

## 2023-09-12 DIAGNOSIS — M5416 Radiculopathy, lumbar region: Secondary | ICD-10-CM

## 2023-09-26 ENCOUNTER — Other Ambulatory Visit: Payer: Self-pay | Admitting: Family Medicine

## 2023-10-05 DIAGNOSIS — M5416 Radiculopathy, lumbar region: Secondary | ICD-10-CM | POA: Diagnosis not present

## 2023-10-05 DIAGNOSIS — Z6829 Body mass index (BMI) 29.0-29.9, adult: Secondary | ICD-10-CM | POA: Diagnosis not present

## 2023-10-18 ENCOUNTER — Ambulatory Visit: Payer: PPO | Admitting: Family Medicine

## 2023-10-19 DIAGNOSIS — M5416 Radiculopathy, lumbar region: Secondary | ICD-10-CM | POA: Diagnosis not present

## 2023-10-20 ENCOUNTER — Ambulatory Visit (INDEPENDENT_AMBULATORY_CARE_PROVIDER_SITE_OTHER): Payer: PPO | Admitting: Family Medicine

## 2023-10-20 ENCOUNTER — Encounter: Payer: Self-pay | Admitting: Family Medicine

## 2023-10-20 VITALS — BP 128/84 | HR 66 | Temp 98.3°F | Wt 161.4 lb

## 2023-10-20 DIAGNOSIS — G8918 Other acute postprocedural pain: Secondary | ICD-10-CM

## 2023-10-20 DIAGNOSIS — M797 Fibromyalgia: Secondary | ICD-10-CM

## 2023-10-20 DIAGNOSIS — F1190 Opioid use, unspecified, uncomplicated: Secondary | ICD-10-CM

## 2023-10-20 MED ORDER — OXYCODONE HCL 5 MG PO TABS: 5.0000 mg | ORAL_TABLET | Freq: Two times a day (BID) | ORAL | 0 refills | Status: DC | PRN

## 2023-10-20 MED ORDER — OXYCODONE HCL 5 MG PO TABS
5.0000 mg | ORAL_TABLET | Freq: Two times a day (BID) | ORAL | 0 refills | Status: DC | PRN
Start: 2023-10-20 — End: 2024-01-16

## 2023-10-20 MED ORDER — MORPHINE SULFATE ER 30 MG PO TBCR: 30.0000 mg | EXTENDED_RELEASE_TABLET | Freq: Two times a day (BID) | ORAL | 0 refills | Status: DC | PRN

## 2023-10-20 NOTE — Progress Notes (Signed)
   Subjective:    Patient ID: Ashley [name], female    DOB: 06/05/1961, 63 y.o.   MRN: [id number]  HPI Here for pain management. She is doing well. We have been treating her fibromyalgia pain, but she is also dealing with low back pain. She saw Dr. Julio Sicks for a neurosurgical evaluation, and he referred her to his partner Dr. Lorrine Kin. On 10-04-23 Dr. Lorrine Kin performed an epidural steroid injection to the right side of L3-4. This was successful and her pain has diminished.    Review of Systems  Constitutional: Negative.   Musculoskeletal:  Positive for back pain and myalgias.       Objective:   Physical Exam Constitutional:      Appearance: Normal appearance.  Neurological:     Mental Status: She is alert.           Assessment & Plan:  Pain management.  Indication for chronic opioid: fibromyalgia  Medication and dose: MS Contin 30 mg and Oxycodone 5 mg # pills per month: 60 and 60 Last UDS date: 10-20-23 Opioid Treatment Agreement signed (Y/N): 11-02-18 Opioid Treatment Agreement last reviewed with patient:  10-20-23 [facility] reviewed this encounter (include red flags): Yes Meds were refilled.  Gershon Crane, MD

## 2023-10-22 LAB — DRUG MONITOR, PANEL 1, W/CONF, URINE
Alphahydroxyalprazolam: 734 ng/mL — ABNORMAL HIGH (ref <25)
Alphahydroxymidazolam: NEGATIVE ng/mL (ref <50)
Alphahydroxytriazolam: NEGATIVE ng/mL (ref <50)
Aminoclonazepam: NEGATIVE ng/mL (ref <25)
Amphetamines: NEGATIVE ng/mL (ref <500)
Barbiturates: NEGATIVE ng/mL (ref <300)
Benzodiazepines: POSITIVE ng/mL — AB (ref <100)
Cocaine Metabolite: NEGATIVE ng/mL (ref <150)
Codeine: NEGATIVE ng/mL (ref <50)
Creatinine: 158.3 mg/dL (ref >=20.0)
Hydrocodone: NEGATIVE ng/mL (ref <50)
Hydromorphone: 175 ng/mL — ABNORMAL HIGH (ref <50)
Hydroxyethylflurazepam: NEGATIVE ng/mL (ref <50)
Lorazepam: NEGATIVE ng/mL (ref <50)
Marijuana Metabolite: 1289 ng/mL — ABNORMAL HIGH (ref <5)
Marijuana Metabolite: POSITIVE ng/mL — AB (ref <20)
Methadone Metabolite: NEGATIVE ng/mL (ref <100)
Morphine: >10000 ng/mL — ABNORMAL HIGH (ref <50)
Nordiazepam: NEGATIVE ng/mL (ref <50)
Norhydrocodone: NEGATIVE ng/mL (ref <50)
Opiates: POSITIVE ng/mL — AB (ref <100)
Oxazepam: NEGATIVE ng/mL (ref <50)
Oxidant: NEGATIVE ug/mL (ref <200)
Oxycodone: NEGATIVE ng/mL (ref <100)
Phencyclidine: NEGATIVE ng/mL (ref <25)
Temazepam: NEGATIVE ng/mL (ref <50)
pH: 5.8 (ref 4.5–9.0)

## 2023-10-22 LAB — DM TEMPLATE

## 2023-10-25 ENCOUNTER — Other Ambulatory Visit: Payer: Self-pay | Admitting: Family Medicine

## 2023-10-31 ENCOUNTER — Ambulatory Visit
Admission: EM | Admit: 2023-10-31 | Discharge: 2023-10-31 | Disposition: A | Discharge status: Home / Self Care | Payer: PPO | Attending: Emergency Medicine | Admitting: Emergency Medicine

## 2023-10-31 DIAGNOSIS — H109 Unspecified conjunctivitis: Secondary | ICD-10-CM

## 2023-10-31 MED ORDER — POLYMYXIN B-TRIMETHOPRIM 10000-0.1 UNIT/ML-% OP SOLN: 1.0000 [drp] | Freq: Four times a day (QID) | OPHTHALMIC | 0 refills | Status: AC

## 2023-10-31 NOTE — Discharge Instructions (Signed)
 Today you being treated for bacterial conjunctivitis.   Place one drop of polytrim into the effected eye every 6 hours while awake for 7 days. If the other eye starts to have symptoms you may use medication in it as well. Do not allow tip of dropper to touch eye.  May use cool compress for comfort and to remove discharge if present. Pat the eye, do not wipe.  If wearing contacts, dispose of current pair. Wear glasses until symptoms have resolved.   Do not rub eyes, this may cause more irritation.  May use benadryl as needed to help if itching present.  Please avoid use of eye makeup until symptoms clear.  If symptoms persist after use of medication, please follow up at Urgent Care or with ophthalmologist (eye doctor)

## 2023-10-31 NOTE — ED Provider Notes (Signed)
 Ashley [name]    CSN: [id number] Arrival date & time: 10/31/23  1145      History   Chief Complaint Chief Complaint  Patient presents with   Eye Problem    HPI Ashley [name] is a 63 y.o. female.   Patient presents for evaluation of pain in pruritus present to the left beginning 1 day ago of presence of.  Denies injury or trauma.  Has attempted treatment of lubricating eyedrops.  Denies Drainage.  Erythema present at baseline, has not worsened. Past Medical History:  Diagnosis Date   Allergy    Asthma    Bipolar disorder (HCC)    dr Flordia Hung    Chronic lower back pain    sees Dr. Renaldo Caroli at [facility]    Depression    Fibromyalgia    dr Renaldo Caroli [place]   GERD (gastroesophageal reflux disease)    Headache(784.0)    HTN (hypertension)    IBS (irritable bowel syndrome)    dr Lavaughn [place]   Injury of lower back 09-23-05   Workers Comp case worker is Gerrianne Krauss (phone [phone] or fax [phone])   Reflex sympathetic dystrophy    left hand dr Clover Dao and dr Rexanne Catalina    Patient Active Problem List   Diagnosis Date Noted   Delayed gastric emptying 12/14/2021   Atherosclerosis of aorta (HCC) 10/16/2021   COVID-19 virus infection 03/18/2021   Trochanteric bursitis of left hip 05/04/2017   Lumbar spondylosis 07/10/2016   Acute postoperative pain 05/25/2016   Lumbar facet syndrome (Location of Primary Source of Pain) (Bilateral) (R>L) 12/02/2015   Chronic pain 12/02/2015   Long term current use of opiate analgesic 12/02/2015   Long term prescription opiate use 12/02/2015   Opiate use (112.5 MME/Day) 12/02/2015   Encounter for therapeutic drug level monitoring 12/02/2015   Pain management 12/02/2015   HTN (hypertension) 08/15/2013   Plantar fasciitis 01/06/2009   Essential hypertension 10/07/2008   GERD 08/30/2007   Overactive bladder 08/14/2007   Genital herpes 03/13/2007   Depression 03/13/2007    Allergic rhinitis 03/13/2007   Asthma 03/13/2007   IBS 03/13/2007   Fibromyalgia 03/13/2007    Past Surgical History:  Procedure Laterality Date   APPENDECTOMY     CARPAL TUNNEL RELEASE Bilateral    CERVICAL SPINE SURGERY     CHOLECYSTECTOMY     COLONOSCOPY  09/05/2020   per Dr. Lavaughn [place], clear, repeat in 5 yrs    ESOPHAGOGASTRODUODENOSCOPY  05/2015   per Dr. Lavaughn [place], benign gastric polyps   left shouder  1996   separation, x 2   NASAL SINUS SURGERY     radiofrequency  ablations april 2010 to lower spine per dr Yvetta Herbert     removal of Left great toenail Left 01/2016   TRIGGER FINGER RELEASE Right     OB History   No obstetric history on file.      Home Medications    Prior to Admission medications   Medication Sig Start Date End Date Taking? Authorizing Provider  atorvastatin  (LIPITOR) 10 MG tablet Take 1 tablet (10 mg total) by mouth daily. 07/18/23  Yes Donley Furth, MD  buPROPion (WELLBUTRIN XL) 300 MG 24 hr tablet Take 300 mg by mouth daily.   Yes [provider]  Glucosamine-Chondroit-Vit C-Mn (GLUCOSAMINE CHONDR 1500 COMPLX PO) Take by mouth 2 (two) times daily.   Yes [provider]  losartan  (COZAAR ) 50 MG tablet Take 1 tablet (50 mg total) by mouth daily. 12/29/22  Yes Donley Furth, MD  Magnesium 250 MG TABS Take 250 mg by mouth daily at 12 noon.   Yes [provider]  methocarbamol  (ROBAXIN ) 750 MG tablet Take by mouth 2 (two) times daily.   Yes [provider]  trimethoprim -polymyxin b  (POLYTRIM ) ophthalmic solution Place 1 drop into the left eye every 6 (six) hours. 10/31/23  Yes Shaelee Forni R, NP  valACYclovir  (VALTREX ) 1000 MG tablet Take 1,000 mg by mouth as needed.    Yes [provider]  albuterol  (PROVENTIL ) (2.5 MG/3ML) 0.083% nebulizer solution Take 3 mLs (2.5 mg total) by nebulization every 4 (four) hours as needed for wheezing or shortness of breath. 04/11/23   Versa Gore, NP  albuterol  (VENTOLIN   HFA) 108 (90 Base) MCG/ACT inhaler Inhale 2 puffs into the lungs every 4 (four) hours as needed for wheezing or shortness of breath. 04/11/23   Versa Gore, NP  alprazolam  (XANAX ) 2 MG tablet Take 1 tablet (2 mg total) by mouth 3 (three) times daily as needed. 02/12/11   Donley Furth, MD  CALCIUM -VITAMIN D PO Take 1 tablet by mouth daily.    [provider]  diclofenac  Sodium (VOLTAREN ) 1 % GEL Apply 4 g topically 4 (four) times daily. 04/27/21   Donley Furth, MD  fluticasone  (CUTIVATE ) 0.05 % cream Apply topically as needed. 10/13/21   [provider]  fluticasone  (FLONASE ) 50 MCG/ACT nasal spray 1 spray each nostril twice a day for 3 days, then reduce to 1 spray each nostril daily. 04/11/23   Versa Gore, NP  ketoconazole  (NIZORAL ) 2 % cream Apply 1 Application topically 2 (two) times daily as needed for irritation. 04/20/22   Donley Furth, MD  meloxicam  (MOBIC ) 15 MG tablet TAKE 1 TABLET BY MOUTH DAILY 08/31/23   Donley Furth, MD  metoCLOPramide  (REGLAN ) 5 MG tablet TAKE 1 TABLET BY MOUTH FOUR TIMES A DAY 08/17/23   Donley Furth, MD  morphine  (MS CONTIN ) 30 MG 12 hr tablet Take 30 mg by mouth every 12 (twelve) hours.    [provider]  morphine  (MS CONTIN ) 30 MG 12 hr tablet Take 1 tablet (30 mg total) by mouth every 12 (twelve) hours as needed for pain. 10/20/23   Donley Furth, MD  morphine  (MS CONTIN ) 30 MG 12 hr tablet Take 1 tablet (30 mg total) by mouth every 12 (twelve) hours as needed for pain. 10/20/23   Donley Furth, MD  morphine  (MS CONTIN ) 30 MG 12 hr tablet Take 1 tablet (30 mg total) by mouth every 12 (twelve) hours as needed for pain. 10/20/23   Donley Furth, MD  Multiple Vitamin (MULTIVITAMIN) capsule Take 1 capsule by mouth daily.    [provider]  Omega-3 Fatty Acids (FISH OIL) 1200 MG CAPS Take by mouth daily at 2 PM.    [provider]  omeprazole (PRILOSEC) 40 MG capsule TAKE 1 CAPSULE BY MOUTH EVERY DAY IN THE  MORNING 10/25/23   Donley Furth, MD  ondansetron  (ZOFRAN ) 8 MG tablet Take 1 tablet (8 mg total) by mouth every 6 (six) hours as needed for nausea or vomiting. 12/11/21   Donley Furth, MD  oxybutynin  (DITROPAN ) 5 MG tablet TAKE 1 TABLET BY MOUTH TWICE A DAY 09/28/23   Donley Furth, MD  oxyCODONE  (OXY IR/ROXICODONE ) 5 MG immediate release tablet Take 1 tablet (5 mg total) by mouth every 12 (twelve) hours as needed for severe pain (pain score 7-10). 10/20/23  Donley Furth, MD  oxyCODONE  (OXY IR/ROXICODONE ) 5 MG immediate release tablet Take 1 tablet (5 mg total) by mouth every 12 (twelve) hours as needed for severe pain (pain score 7-10). 10/20/23   Donley Furth, MD  oxyCODONE  (OXY IR/ROXICODONE ) 5 MG immediate release tablet Take 1 tablet (5 mg total) by mouth every 12 (twelve) hours as needed for severe pain (pain score 7-10). 10/20/23   Donley Furth, MD  potassium chloride  (KLOR-CON ) 10 MEQ tablet TAKE 1 TABLET BY MOUTH TWICE A DAY 08/05/23   Donley Furth, MD  risperiDONE (RISPERDAL) 2 MG tablet Take 2 mg by mouth at bedtime.    [provider]  traZODone (DESYREL) 150 MG tablet Take 75 mg by mouth at bedtime.    [provider]    Family History Family History  Problem Relation Age of Onset   Hypertension Mother    Cancer Father        lung   Cancer Maternal Aunt    Dementia Maternal Grandmother    Stroke Maternal Grandmother    Dementia Maternal Grandfather    Heart attack Maternal Grandfather    Alzheimer's disease Paternal Grandmother    Heart attack Paternal Grandfather     Social History Social History   Tobacco Use   Smoking status: Never   Smokeless tobacco: Never  Vaping Use   Vaping status: Never Used  Substance Use Topics   Alcohol use: Yes    Alcohol/week: 2.0 standard drinks of alcohol    Types: 2 Standard drinks or equivalent per week    Comment: occ, 1-2 4 days/week   Drug use: No     Allergies   Aspirin, Ciprofloxacin, Gabapentin,  and Levetiracetam   Review of Systems Review of Systems   Physical Exam Triage Vital Signs ED Triage Vitals  Encounter Vitals Group     BP 10/31/23 1308 112/74     Systolic BP Percentile --      Diastolic BP Percentile --      Pulse Rate 10/31/23 1308 76     Resp 10/31/23 1308 16     Temp 10/31/23 1308 97.7 F (36.5 C)     Temp Source 10/31/23 1308 Oral     SpO2 10/31/23 1308 97 %     Weight --      Height --      Head Circumference --      Peak Flow --      Pain Score 10/31/23 1307 4     Pain Loc --      Pain Education --      Exclude from Growth Chart --    No data found.  Updated Vital Signs BP 112/74 (BP Location: Left Arm)   Pulse 76   Temp 97.7 F (36.5 C) (Oral)   Resp 16   SpO2 97%   Visual Acuity Right Eye Distance:   Left Eye Distance:   Bilateral Distance:    Right Eye Near:   Left Eye Near:    Bilateral Near:     Physical Exam Constitutional:      Appearance: Normal appearance.  HENT:     Mouth/Throat:     Comments: Mild swelling present to the left upper eyelid without erythema, no stye noted, vision grossly intact, extraocular movements intact, no drainage on exam Neurological:     Mental Status: She is alert.      UC Treatments / Results  Labs (all labs ordered are listed, but only  abnormal results are displayed) Labs Reviewed - No data to display  EKG   Radiology No results found.  Procedures Procedures (including critical care time)  Medications Ordered in UC Medications - No data to display  Initial Impression / Assessment and Plan / UC Course  I have reviewed the triage vital signs and the nursing notes.  Pertinent labs & imaging results that were available during my care of the patient were reviewed by me and considered in my medical decision making (see chart for details).  Bacterial conjunctivitis of the left eye  Providing coverage for bacteria, Polytrim  prescribed, discussed supportive measures, advised  follow-up with ophthalmology if symptoms persist or worsen Final Clinical Impressions(s) / UC Diagnoses   Final diagnoses:  Bacterial conjunctivitis of left eye     Discharge Instructions      Today you being treated for bacterial conjunctivitis.   Place one drop of polytrim  into the effected eye every 6 hours while awake for 7 days. If the other eye starts to have symptoms you may use medication in it as well. Do not allow tip of dropper to touch eye.  May use cool compress for comfort and to remove discharge if present. Pat the eye, do not wipe.  If wearing contacts, dispose of current pair. Wear glasses until symptoms have resolved.   Do not rub eyes, this may cause more irritation.  May use benadryl as needed to help if itching present.  Please avoid use of eye makeup until symptoms clear.  If symptoms persist after use of medication, please follow up at Urgent Care or with ophthalmologist (eye doctor)    ED Prescriptions     Medication Sig Dispense Auth. Provider   trimethoprim -polymyxin b  (POLYTRIM ) ophthalmic solution Place 1 drop into the left eye every 6 (six) hours. 10 mL Reena Canning, NP      PDMP not reviewed this encounter.   Reena Canning, [place] 10/31/23 1339

## 2023-10-31 NOTE — ED Triage Notes (Signed)
 Eye problem that started yesterday. Hurts to blink and move eye. No vision changes. Left eye

## 2023-11-11 ENCOUNTER — Other Ambulatory Visit: Payer: Self-pay | Admitting: Family Medicine

## 2023-11-21 ENCOUNTER — Other Ambulatory Visit: Payer: Self-pay | Admitting: Family

## 2023-11-21 DIAGNOSIS — J069 Acute upper respiratory infection, unspecified: Secondary | ICD-10-CM

## 2023-12-05 ENCOUNTER — Other Ambulatory Visit: Payer: Self-pay | Admitting: Family Medicine

## 2023-12-30 ENCOUNTER — Encounter: Payer: Self-pay | Admitting: Family Medicine

## 2023-12-30 ENCOUNTER — Ambulatory Visit (INDEPENDENT_AMBULATORY_CARE_PROVIDER_SITE_OTHER): Payer: PPO | Admitting: Family Medicine

## 2023-12-30 VITALS — BP 118/74 | HR 66 | Temp 98.1°F | Ht 62.0 in | Wt 162.0 lb

## 2023-12-30 DIAGNOSIS — Z0000 Encounter for general adult medical examination without abnormal findings: Secondary | ICD-10-CM | POA: Diagnosis not present

## 2023-12-30 DIAGNOSIS — Z23 Encounter for immunization: Secondary | ICD-10-CM | POA: Diagnosis not present

## 2023-12-30 DIAGNOSIS — F3181 Bipolar II disorder: Secondary | ICD-10-CM | POA: Diagnosis not present

## 2023-12-30 LAB — CBC WITH DIFFERENTIAL/PLATELET
Basophils Absolute: 0 10*3/uL (ref 0.0–0.1)
Basophils Relative: 0.5 % (ref 0.0–3.0)
Eosinophils Absolute: 0.1 10*3/uL (ref 0.0–0.7)
Eosinophils Relative: 0.7 % (ref 0.0–5.0)
HCT: 39.2 % (ref 36.0–46.0)
Hemoglobin: 13.4 g/dL (ref 12.0–15.0)
Lymphocytes Relative: 19.3 % (ref 12.0–46.0)
Lymphs Abs: 1.5 10*3/uL (ref 0.7–4.0)
MCHC: 34.2 g/dL (ref 30.0–36.0)
MCV: 92.7 fl (ref 78.0–100.0)
Monocytes Absolute: 0.6 10*3/uL (ref 0.1–1.0)
Monocytes Relative: 7.2 % (ref 3.0–12.0)
Neutro Abs: 5.7 10*3/uL (ref 1.4–7.7)
Neutrophils Relative %: 72.3 % (ref 43.0–77.0)
Platelets: 256 10*3/uL (ref 150.0–400.0)
RBC: 4.23 Mil/uL (ref 3.87–5.11)
RDW: 13.4 % (ref 11.5–15.5)
WBC: 7.9 10*3/uL (ref 4.0–10.5)

## 2023-12-30 LAB — HEPATIC FUNCTION PANEL
ALT: 23 U/L (ref 0–35)
AST: 20 U/L (ref 0–37)
Albumin: 4.3 g/dL (ref 3.5–5.2)
Alkaline Phosphatase: 74 U/L (ref 39–117)
Bilirubin, Direct: 0.2 mg/dL (ref 0.0–0.3)
Total Bilirubin: 0.8 mg/dL (ref 0.2–1.2)
Total Protein: 6.5 g/dL (ref 6.0–8.3)

## 2023-12-30 LAB — TSH: TSH: 1.42 u[IU]/mL (ref 0.35–5.50)

## 2023-12-30 LAB — BASIC METABOLIC PANEL WITH GFR
BUN: 15 mg/dL (ref 6–23)
CO2: 29 meq/L (ref 19–32)
Calcium: 9.3 mg/dL (ref 8.4–10.5)
Chloride: 103 meq/L (ref 96–112)
Creatinine, Ser: 0.81 mg/dL (ref 0.40–1.20)
GFR: 78.69 mL/min (ref >60.00)
Glucose, Bld: 105 mg/dL — ABNORMAL HIGH (ref 70–99)
Potassium: 3.8 meq/L (ref 3.5–5.1)
Sodium: 138 meq/L (ref 135–145)

## 2023-12-30 LAB — LIPID PANEL
Cholesterol: 139 mg/dL (ref 0–200)
HDL: 61.7 mg/dL (ref >39.00)
LDL Cholesterol: 59 mg/dL (ref 0–99)
NonHDL: 76.96
Total CHOL/HDL Ratio: 2
Triglycerides: 90 mg/dL (ref 0.0–149.0)
VLDL: 18 mg/dL (ref 0.0–40.0)

## 2023-12-30 LAB — HEMOGLOBIN A1C: Hgb A1c MFr Bld: 5.1 % (ref 4.6–6.5)

## 2023-12-30 MED ORDER — VALACYCLOVIR HCL 1 G PO TABS: 1000.0000 mg | ORAL_TABLET | Freq: Two times a day (BID) | ORAL | 5 refills | Status: AC

## 2023-12-30 MED ORDER — FLUCONAZOLE 150 MG PO TABS: 150.0000 mg | ORAL_TABLET | Freq: Every day | ORAL | 11 refills | Status: AC

## 2023-12-30 NOTE — Progress Notes (Signed)
   Subjective:    Patient ID: Ashley [name], female    DOB: December 20, 1960, 62 y.o.   MRN: [id number]  HPI Here for a well exam. She is doing well in general, other than her fibromyalgia and back pain.    Review of Systems  Constitutional: Negative.   HENT: Negative.    Eyes: Negative.   Respiratory: Negative.    Cardiovascular: Negative.   Gastrointestinal: Negative.   Genitourinary:  Negative for decreased urine volume, difficulty urinating, dyspareunia, dysuria, enuresis, flank pain, frequency, hematuria, pelvic pain and urgency.  Musculoskeletal:  Positive for back pain and myalgias.  Skin: Negative.   Neurological: Negative.  Negative for headaches.  Psychiatric/Behavioral: Negative.         Objective:   Physical Exam Constitutional:      General: She is not in acute distress.    Appearance: Normal appearance. She is well-developed.  HENT:     Head: Normocephalic and atraumatic.     Right Ear: External ear normal.     Left Ear: External ear normal.     Nose: Nose normal.     Mouth/Throat:     Pharynx: No oropharyngeal exudate.  Eyes:     General: No scleral icterus.    Conjunctiva/sclera: Conjunctivae normal.     Pupils: Pupils are equal, round, and reactive to light.  Neck:     Thyroid: No thyromegaly.     Vascular: No JVD.  Cardiovascular:     Rate and Rhythm: Normal rate and regular rhythm.     Pulses: Normal pulses.     Heart sounds: Normal heart sounds. No murmur heard.    No friction rub. No gallop.  Pulmonary:     Effort: Pulmonary effort is normal. No respiratory distress.     Breath sounds: Normal breath sounds. No wheezing or rales.  Chest:     Chest wall: No tenderness.  Abdominal:     General: Bowel sounds are normal. There is no distension.     Palpations: Abdomen is soft. There is no mass.     Tenderness: There is no abdominal tenderness. There is no guarding or rebound.  Musculoskeletal:        General: No tenderness. Normal range of motion.      Cervical back: Normal range of motion and neck supple.  Lymphadenopathy:     Cervical: No cervical adenopathy.  Skin:    General: Skin is warm and dry.     Findings: No erythema or rash.  Neurological:     General: No focal deficit present.     Mental Status: She is alert and oriented to person, place, and time.     Cranial Nerves: No cranial nerve deficit.     Motor: No abnormal muscle tone.     Coordination: Coordination normal.     Deep Tendon Reflexes: Reflexes are normal and symmetric. Reflexes normal.  Psychiatric:        Mood and Affect: Mood normal.        Behavior: Behavior normal.        Thought Content: Thought content normal.        Judgment: Judgment normal.           Assessment & Plan:  Well exam. We discussed diet and exercise. Get fasting labs. Gershon Crane, MD

## 2023-12-30 NOTE — Addendum Note (Signed)
 Addended by: Carola Rhine on: 12/30/2023 11:41 AM   Modules accepted: Orders

## 2024-01-04 ENCOUNTER — Encounter: Payer: Self-pay | Admitting: *Deleted

## 2024-01-11 DIAGNOSIS — L821 Other seborrheic keratosis: Secondary | ICD-10-CM | POA: Diagnosis not present

## 2024-01-11 DIAGNOSIS — Z09 Encounter for follow-up examination after completed treatment for conditions other than malignant neoplasm: Secondary | ICD-10-CM | POA: Diagnosis not present

## 2024-01-11 DIAGNOSIS — D225 Melanocytic nevi of trunk: Secondary | ICD-10-CM | POA: Diagnosis not present

## 2024-01-11 DIAGNOSIS — L814 Other melanin hyperpigmentation: Secondary | ICD-10-CM | POA: Diagnosis not present

## 2024-01-11 DIAGNOSIS — L570 Actinic keratosis: Secondary | ICD-10-CM | POA: Diagnosis not present

## 2024-01-16 ENCOUNTER — Encounter: Payer: Self-pay | Admitting: Family Medicine

## 2024-01-16 ENCOUNTER — Ambulatory Visit (INDEPENDENT_AMBULATORY_CARE_PROVIDER_SITE_OTHER): Payer: PPO | Admitting: Family Medicine

## 2024-01-16 VITALS — BP 118/80 | HR 66 | Temp 98.5°F | Wt 158.0 lb

## 2024-01-16 DIAGNOSIS — G8918 Other acute postprocedural pain: Secondary | ICD-10-CM | POA: Diagnosis not present

## 2024-01-16 DIAGNOSIS — F1190 Opioid use, unspecified, uncomplicated: Secondary | ICD-10-CM

## 2024-01-16 DIAGNOSIS — M797 Fibromyalgia: Secondary | ICD-10-CM | POA: Diagnosis not present

## 2024-01-16 MED ORDER — OXYCODONE HCL 5 MG PO TABS
5.0000 mg | ORAL_TABLET | Freq: Two times a day (BID) | ORAL | 0 refills | Status: DC | PRN
Start: 2024-01-16 — End: 2024-04-17

## 2024-01-16 MED ORDER — OXYCODONE HCL 5 MG PO TABS
5.0000 mg | ORAL_TABLET | Freq: Two times a day (BID) | ORAL | 0 refills | Status: DC | PRN
Start: 2024-01-16 — End: 2024-07-18

## 2024-01-16 MED ORDER — MORPHINE SULFATE ER 30 MG PO TBCR: 30.0000 mg | EXTENDED_RELEASE_TABLET | Freq: Two times a day (BID) | ORAL | 0 refills | Status: DC | PRN

## 2024-01-16 NOTE — Progress Notes (Signed)
   Subjective:    Patient ID: Ashley [name], female    DOB: 08/20/1961, 63 y.o.   MRN: [id number]  HPI Here for pain management. She is doing well. She will start swimming in her neighborhood pool soon, and this helps her feel better.    Review of Systems  Constitutional: Negative.   Musculoskeletal:  Positive for myalgias.       Objective:   Physical Exam Constitutional:      Appearance: Normal appearance.  Neurological:     Mental Status: She is alert.           Assessment & Plan:  Pain management.  Indication for chronic opioid: fibromyalgia Medication and dose: MS Contin  30 mg and Oxycodone  5 mg # pills per month: 60 and 60 Last UDS date: 10-20-23 Opioid Treatment Agreement signed (Y/N): 11-02-18 Opioid Treatment Agreement last reviewed with patient:  01-16-24 [facility] reviewed this encounter (include red flags): Yes Meds were refilled.  Ashley Diego, MD

## 2024-02-08 ENCOUNTER — Other Ambulatory Visit: Payer: Self-pay | Admitting: Family Medicine

## 2024-02-08 DIAGNOSIS — K219 Gastro-esophageal reflux disease without esophagitis: Secondary | ICD-10-CM

## 2024-02-15 ENCOUNTER — Other Ambulatory Visit: Payer: Self-pay | Admitting: Family Medicine

## 2024-03-13 DIAGNOSIS — M2042 Other hammer toe(s) (acquired), left foot: Secondary | ICD-10-CM | POA: Diagnosis not present

## 2024-03-13 DIAGNOSIS — M65972 Unspecified synovitis and tenosynovitis, left ankle and foot: Secondary | ICD-10-CM | POA: Diagnosis not present

## 2024-03-13 DIAGNOSIS — M79672 Pain in left foot: Secondary | ICD-10-CM | POA: Diagnosis not present

## 2024-03-13 DIAGNOSIS — M545 Low back pain, unspecified: Secondary | ICD-10-CM | POA: Diagnosis not present

## 2024-03-13 DIAGNOSIS — G8929 Other chronic pain: Secondary | ICD-10-CM | POA: Diagnosis not present

## 2024-03-14 DIAGNOSIS — G8929 Other chronic pain: Secondary | ICD-10-CM | POA: Diagnosis not present

## 2024-03-14 DIAGNOSIS — M545 Low back pain, unspecified: Secondary | ICD-10-CM | POA: Diagnosis not present

## 2024-03-14 DIAGNOSIS — M2042 Other hammer toe(s) (acquired), left foot: Secondary | ICD-10-CM | POA: Diagnosis not present

## 2024-03-14 DIAGNOSIS — M65972 Unspecified synovitis and tenosynovitis, left ankle and foot: Secondary | ICD-10-CM | POA: Diagnosis not present

## 2024-03-14 DIAGNOSIS — M79672 Pain in left foot: Secondary | ICD-10-CM | POA: Diagnosis not present

## 2024-03-22 ENCOUNTER — Other Ambulatory Visit: Payer: Self-pay | Admitting: Family Medicine

## 2024-04-16 ENCOUNTER — Ambulatory Visit: Admitting: Family Medicine

## 2024-04-17 ENCOUNTER — Encounter: Payer: Self-pay | Admitting: Family Medicine

## 2024-04-17 ENCOUNTER — Ambulatory Visit (INDEPENDENT_AMBULATORY_CARE_PROVIDER_SITE_OTHER): Admitting: Family Medicine

## 2024-04-17 VITALS — BP 132/78 | HR 80 | Temp 98.2°F | Wt 161.0 lb

## 2024-04-17 DIAGNOSIS — M797 Fibromyalgia: Secondary | ICD-10-CM

## 2024-04-17 DIAGNOSIS — G8929 Other chronic pain: Secondary | ICD-10-CM

## 2024-04-17 DIAGNOSIS — G8918 Other acute postprocedural pain: Secondary | ICD-10-CM | POA: Diagnosis not present

## 2024-04-17 MED ORDER — OXYCODONE HCL 5 MG PO TABS: 5.0000 mg | ORAL_TABLET | Freq: Two times a day (BID) | ORAL | 0 refills | Status: DC | PRN

## 2024-04-17 MED ORDER — MORPHINE SULFATE ER 30 MG PO TBCR: 30.0000 mg | EXTENDED_RELEASE_TABLET | Freq: Two times a day (BID) | ORAL | 0 refills | Status: DC | PRN

## 2024-04-17 NOTE — Progress Notes (Signed)
   Subjective:    Patient ID: Ashley [name], female    DOB: 1961-06-22, 63 y.o.   MRN: [id number]  HPI Here for pain management. She is doing fairly well. She recently had an epidural steroid injection, and she is planning to have a second one in a few weeks.    Review of Systems  Constitutional: Negative.   Musculoskeletal:  Positive for back pain and myalgias.       Objective:   Physical Exam Constitutional:      Appearance: Normal appearance.  Neurological:     Mental Status: She is alert.           Assessment & Plan:  Pain management.  Indication for chronic opioid: fibromyalgia  Medication and dose: MS Contin  30 mg and Oxycodone  5 mg  # pills per month: 60 and 60 Last UDS date: 10-20-23 Opioid Treatment Agreement signed (Y/N): 11-02-18 Opioid Treatment Agreement last reviewed with patient:  04-17-24 [facility] reviewed this encounter (include red flags): Yes Meds were refilled.  Garnette Olmsted, MD

## 2024-04-24 DIAGNOSIS — F3181 Bipolar II disorder: Secondary | ICD-10-CM | POA: Diagnosis not present

## 2024-05-15 ENCOUNTER — Other Ambulatory Visit: Payer: Self-pay | Admitting: Family Medicine

## 2024-05-17 DIAGNOSIS — M2042 Other hammer toe(s) (acquired), left foot: Secondary | ICD-10-CM | POA: Diagnosis not present

## 2024-05-17 DIAGNOSIS — M65972 Unspecified synovitis and tenosynovitis, left ankle and foot: Secondary | ICD-10-CM | POA: Diagnosis not present

## 2024-05-17 DIAGNOSIS — M79672 Pain in left foot: Secondary | ICD-10-CM | POA: Diagnosis not present

## 2024-05-21 ENCOUNTER — Other Ambulatory Visit: Payer: Self-pay | Admitting: Family Medicine

## 2024-06-16 ENCOUNTER — Ambulatory Visit: Payer: Self-pay

## 2024-06-28 DIAGNOSIS — F3181 Bipolar II disorder: Secondary | ICD-10-CM | POA: Diagnosis not present

## 2024-07-04 ENCOUNTER — Other Ambulatory Visit: Payer: Self-pay | Admitting: Family Medicine

## 2024-07-18 ENCOUNTER — Encounter: Payer: Self-pay | Admitting: Family Medicine

## 2024-07-18 ENCOUNTER — Ambulatory Visit: Admitting: Family Medicine

## 2024-07-18 VITALS — BP 120/80 | HR 80 | Temp 98.1°F | Wt 158.2 lb

## 2024-07-18 DIAGNOSIS — M797 Fibromyalgia: Secondary | ICD-10-CM

## 2024-07-18 DIAGNOSIS — G8929 Other chronic pain: Secondary | ICD-10-CM | POA: Diagnosis not present

## 2024-07-18 DIAGNOSIS — G8918 Other acute postprocedural pain: Secondary | ICD-10-CM | POA: Diagnosis not present

## 2024-07-18 MED ORDER — MORPHINE SULFATE ER 30 MG PO TBCR: 30.0000 mg | EXTENDED_RELEASE_TABLET | Freq: Two times a day (BID) | ORAL | 0 refills | Status: DC | PRN

## 2024-07-18 MED ORDER — OXYBUTYNIN CHLORIDE 5 MG PO TABS: 5.0000 mg | ORAL_TABLET | Freq: Two times a day (BID) | ORAL | 3 refills | Status: AC

## 2024-07-18 MED ORDER — OXYCODONE HCL 5 MG PO TABS: 5.0000 mg | ORAL_TABLET | Freq: Two times a day (BID) | ORAL | 0 refills | Status: DC | PRN

## 2024-07-18 MED ORDER — MORPHINE SULFATE ER 30 MG PO TBCR: 30.0000 mg | EXTENDED_RELEASE_TABLET | Freq: Two times a day (BID) | ORAL | 0 refills | Status: AC | PRN

## 2024-07-18 NOTE — Progress Notes (Signed)
   Subjective:    Patient ID: Ashley [name], female    DOB: 15-Jun-1961, 63 y.o.   MRN: [id number]  HPI Here for pain management. She is doing well.    Review of Systems  Constitutional: Negative.   Musculoskeletal:  Positive for myalgias.       Objective:   Physical Exam Constitutional:      Appearance: Normal appearance.  Neurological:     Mental Status: She is alert.           Assessment & Plan:  Pain management. Indication for chronic opioid: fibromyalgia  Medication and dose: MS Contin  30 mg and Oxycodone  5 mg # pills per month: 60 and 60 Last UDS date: 10-20-23 Opioid Treatment Agreement signed (Y/N): 11-02-18 Opioid Treatment Agreement last reviewed with patient:  07-18-24 [facility] reviewed this encounter (include red flags): Yes Meds were refilled.  Garnette Olmsted, MD

## 2024-08-04 ENCOUNTER — Other Ambulatory Visit: Payer: Self-pay | Admitting: Family Medicine

## 2024-08-04 DIAGNOSIS — K219 Gastro-esophageal reflux disease without esophagitis: Secondary | ICD-10-CM

## 2024-08-09 DIAGNOSIS — H00025 Hordeolum internum left lower eyelid: Secondary | ICD-10-CM | POA: Diagnosis not present

## 2024-08-09 DIAGNOSIS — H0015 Chalazion left lower eyelid: Secondary | ICD-10-CM | POA: Diagnosis not present

## 2024-08-21 ENCOUNTER — Telehealth: Admitting: Family Medicine

## 2024-08-21 ENCOUNTER — Encounter: Payer: Self-pay | Admitting: Family Medicine

## 2024-08-21 DIAGNOSIS — M5441 Lumbago with sciatica, right side: Secondary | ICD-10-CM

## 2024-08-21 MED ORDER — METHYLPREDNISOLONE 4 MG PO TBPK: ORAL_TABLET | ORAL | 0 refills | Status: DC

## 2024-08-21 NOTE — Progress Notes (Signed)
 Subjective:    Patient ID: Ashley [name], female    DOB: 1961/07/08, 63 y.o.   MRN: [id number]  HPI Virtual Visit via Video Note  I connected with the patient on 08/21/24 at  9:15 AM EST by a video enabled telemedicine application and verified that I am speaking with the correct person using two identifiers.  Location patient: home Location provider:work or home office Persons participating in the virtual visit: patient, provider  I discussed the limitations of evaluation and management by telemedicine and the availability of in person appointments. The patient expressed understanding and agreed to proceed.   HPI: Here with a flare of low back pain that radiates to the right knee. This started several days ago. She has applied ice and heat. She has done some stretching. She is taking her usual Methocarbamol  and her pain medications.    ROS: See pertinent positives and negatives per HPI.  Past Medical History:  Diagnosis Date   Allergy    Asthma    Bipolar disorder (HCC)    dr reyes na    Chronic lower back pain    sees Dr. Eric Como at [facility]    Depression    Fibromyalgia    dr eric como [place]   GERD (gastroesophageal reflux disease)    Headache(784.0)    HTN (hypertension)    IBS (irritable bowel syndrome)    dr Rosalie   Injury of lower back 09-23-05   [employer] case worker is Sueanne Papa (phone [phone] or fax [phone])   Reflex sympathetic dystrophy    left hand dr anastasio and dr bonner    Past Surgical History:  Procedure Laterality Date   APPENDECTOMY     CARPAL TUNNEL RELEASE Bilateral    CERVICAL SPINE SURGERY     CHOLECYSTECTOMY     COLONOSCOPY  09/05/2020   per Dr. Rosalie, clear, repeat in 5 yrs    ESOPHAGOGASTRODUODENOSCOPY  05/2015   per Dr. Rosalie, benign gastric polyps   left shouder  1996   separation, x 2   NASAL SINUS SURGERY     radiofrequency  ablations april 2010 to lower  spine per dr Daymon     removal of Left great toenail Left 01/2016   TRIGGER FINGER RELEASE Right     Family History  Problem Relation Age of Onset   Hypertension Mother    Cancer Father        lung   Cancer Maternal Aunt    Dementia Maternal Grandmother    Stroke Maternal Grandmother    Dementia Maternal Grandfather    Heart attack Maternal Grandfather    Alzheimer's disease Paternal Grandmother    Heart attack Paternal Grandfather      Current Outpatient Medications:    albuterol  (VENTOLIN  HFA) 108 (90 Base) MCG/ACT inhaler, Inhale 2 puffs into the lungs every 4 (four) hours as needed for wheezing or shortness of breath., Disp: 1 each, Rfl: 5   alprazolam  (XANAX ) 2 MG tablet, Take 1 tablet (2 mg total) by mouth 3 (three) times daily as needed., Disp: 90 tablet, Rfl: 5   atorvastatin  (LIPITOR) 10 MG tablet, TAKE 1 TABLET BY MOUTH EVERY DAY, Disp: 90 tablet, Rfl: 3   buPROPion (WELLBUTRIN XL) 300 MG 24 hr tablet, Take 300 mg by mouth daily., Disp: , Rfl:    CALCIUM -VITAMIN D PO, Take 1 tablet by mouth daily., Disp: , Rfl:    diclofenac  Sodium (VOLTAREN ) 1 % GEL, Apply 4 g topically 4 (  four) times daily., Disp: 100 g, Rfl: 11   fluconazole  (DIFLUCAN ) 150 MG tablet, Take 1 tablet (150 mg total) by mouth daily., Disp: 10 tablet, Rfl: 11   fluticasone  (CUTIVATE ) 0.05 % cream, Apply topically as needed., Disp: , Rfl:    fluticasone  (FLONASE ) 50 MCG/ACT nasal spray, 1 spray each nostril twice a day for 3 days, then reduce to 1 spray each nostril daily., Disp: 16 g, Rfl: 1   Glucosamine-Chondroit-Vit C-Mn (GLUCOSAMINE CHONDR 1500 COMPLX PO), Take by mouth 2 (two) times daily., Disp: , Rfl:    ketoconazole  (NIZORAL ) 2 % cream, Apply 1 Application topically 2 (two) times daily as needed for irritation., Disp: 60 g, Rfl: 5   losartan  (COZAAR ) 50 MG tablet, TAKE 1 TABLET BY MOUTH DAILY, Disp: 90 tablet, Rfl: 3   Magnesium 250 MG TABS, Take 250 mg by mouth daily at 12 noon., Disp: , Rfl:     meloxicam  (MOBIC ) 15 MG tablet, TAKE 1 TABLET BY MOUTH DAILY, Disp: 90 tablet, Rfl: 3   methocarbamol  (ROBAXIN ) 750 MG tablet, TAKE 1 TABLET BY MOUTH EVERY 6 HOURS AS NEEDED FOR MUSCLE SPASMS, Disp: 120 tablet, Rfl: 2   metoCLOPramide  (REGLAN ) 5 MG tablet, TAKE 1 TABLET BY MOUTH FOUR TIMES A DAY, Disp: 120 tablet, Rfl: 3   morphine  (MS CONTIN ) 30 MG 12 hr tablet, Take 1 tablet (30 mg total) by mouth every 12 (twelve) hours as needed for pain., Disp: 60 tablet, Rfl: 0   morphine  (MS CONTIN ) 30 MG 12 hr tablet, Take 1 tablet (30 mg total) by mouth every 12 (twelve) hours as needed for pain., Disp: 60 tablet, Rfl: 0   Multiple Vitamin (MULTIVITAMIN) capsule, Take 1 capsule by mouth daily., Disp: , Rfl:    Omega-3 Fatty Acids (FISH OIL) 1200 MG CAPS, Take by mouth daily at 2 PM., Disp: , Rfl:    omeprazole (PRILOSEC) 40 MG capsule, TAKE 1 CAPSULE BY MOUTH EVERY DAY IN THE MORNING, Disp: 90 capsule, Rfl: 3   ondansetron  (ZOFRAN ) 8 MG tablet, Take 1 tablet (8 mg total) by mouth every 6 (six) hours as needed for nausea or vomiting., Disp: 60 tablet, Rfl: 2   oxybutynin  (DITROPAN ) 5 MG tablet, Take 1 tablet (5 mg total) by mouth 2 (two) times daily., Disp: 180 tablet, Rfl: 3   oxyCODONE  (OXY IR/ROXICODONE ) 5 MG immediate release tablet, Take 1 tablet (5 mg total) by mouth every 12 (twelve) hours as needed for severe pain (pain score 7-10)., Disp: 60 tablet, Rfl: 0   oxyCODONE  (OXY IR/ROXICODONE ) 5 MG immediate release tablet, Take 1 tablet (5 mg total) by mouth every 12 (twelve) hours as needed for severe pain (pain score 7-10)., Disp: 60 tablet, Rfl: 0   potassium chloride  (KLOR-CON ) 10 MEQ tablet, TAKE 1 TABLET BY MOUTH TWICE A DAY, Disp: 180 tablet, Rfl: 3   promethazine -dextromethorphan (PROMETHAZINE -DM) 6.25-15 MG/5ML syrup, TAKE 5 ML BY MOUTH AT BEDTIME AS NEEDED FOR COUGH, Disp: 120 mL, Rfl: 0   risperiDONE (RISPERDAL) 2 MG tablet, Take 2 mg by mouth at bedtime., Disp: , Rfl:    traZODone (DESYREL) 150  MG tablet, Take 75 mg by mouth at bedtime., Disp: , Rfl:    trimethoprim -polymyxin b  (POLYTRIM ) ophthalmic solution, Place 1 drop into the left eye every 6 (six) hours., Disp: 10 mL, Rfl: 0   valACYclovir  (VALTREX ) 1000 MG tablet, Take 1,000 mg by mouth as needed. , Disp: , Rfl:    valACYclovir  (VALTREX ) 1000 MG tablet, Take 1 tablet (1,000  mg total) by mouth 2 (two) times daily., Disp: 60 tablet, Rfl: 5   albuterol  (PROVENTIL ) (2.5 MG/3ML) 0.083% nebulizer solution, Take 3 mLs (2.5 mg total) by nebulization every 4 (four) hours as needed for wheezing or shortness of breath., Disp: 75 mL, Rfl: 5   morphine  (MS CONTIN ) 30 MG 12 hr tablet, Take 1 tablet (30 mg total) by mouth every 12 (twelve) hours as needed for pain., Disp: 60 tablet, Rfl: 0   oxyCODONE  (OXY IR/ROXICODONE ) 5 MG immediate release tablet, Take 1 tablet (5 mg total) by mouth every 12 (twelve) hours as needed for severe pain (pain score 7-10)., Disp: 60 tablet, Rfl: 0  EXAM:  VITALS per patient if applicable:  GENERAL: alert, oriented, appears well and in no acute distress  HEENT: atraumatic, conjunttiva clear, no obvious abnormalities on inspection of external nose and ears  NECK: normal movements of the head and neck  LUNGS: on inspection no signs of respiratory distress, breathing rate appears normal, no obvious gross SOB, gasping or wheezing  CV: no obvious cyanosis  MS: moves all visible extremities without noticeable abnormality  PSYCH/NEURO: pleasant and cooperative, no obvious depression or anxiety, speech and thought processing grossly intact  ASSESSMENT AND PLAN: Acute exacerbation of low back pain, we will start her on a Medrol  dose pack. Recheck as needed.  Garnette Olmsted, MD  Discussed the following assessment and plan:  No diagnosis found.     I discussed the assessment and treatment plan with the patient. The patient was provided an opportunity to ask questions and all were answered. The patient agreed  with the plan and demonstrated an understanding of the instructions.   The patient was advised to call back or seek an in-person evaluation if the symptoms worsen or if the condition fails to improve as anticipated.      Review of Systems     Objective:   Physical Exam        Assessment & Plan:

## 2024-08-28 ENCOUNTER — Ambulatory Visit: Admitting: Family Medicine

## 2024-08-30 ENCOUNTER — Ambulatory Visit: Admitting: Family Medicine

## 2024-09-17 ENCOUNTER — Other Ambulatory Visit: Payer: Self-pay | Admitting: Family Medicine

## 2024-10-11 ENCOUNTER — Telehealth: Payer: Self-pay

## 2024-10-11 NOTE — Telephone Encounter (Signed)
 Copied from CRM #[id number]. Topic: Clinical - Prescription Issue >> Oct 11, 2024  4:41 PM Rea ORN wrote: Reason for CRM: Pt asking for PCP nurse to call her back. Pt advised with the weather, she is unsure if she will be able to keep her 10/17/24 appt. If the weather prevents her appt, pt is asking how will she get her refill of morphine  (MS CONTIN ) 30 MG 12 hr tablet since an appt is needed for it? She said she does not want to go without it.  Pt asking for a call back today if possible, please call back to advise,  [phone]

## 2024-10-12 MED ORDER — MORPHINE SULFATE ER 30 MG PO TBCR: 30.0000 mg | EXTENDED_RELEASE_TABLET | Freq: Two times a day (BID) | ORAL | 0 refills | Status: DC | PRN

## 2024-10-12 NOTE — Addendum Note (Signed)
 Addended by: JOHNNY SENIOR A on: 10/12/2024 03:57 PM   Modules accepted: Orders
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
## 2024-10-12 NOTE — Telephone Encounter (Signed)
I sent in a 30 day supply

## 2024-10-12 NOTE — Telephone Encounter (Signed)
 Pt notified to pick up Rx from her pharmacy, voiced understanding

## 2024-10-12 NOTE — Telephone Encounter (Signed)
 I agree, I am sure we can see her on the 28th one way or the other

## 2024-10-17 ENCOUNTER — Ambulatory Visit (INDEPENDENT_AMBULATORY_CARE_PROVIDER_SITE_OTHER): Admitting: Family Medicine

## 2024-10-17 ENCOUNTER — Encounter: Payer: Self-pay | Admitting: Family Medicine

## 2024-10-17 VITALS — BP 130/84 | HR 64 | Temp 98.4°F | Wt 159.2 lb

## 2024-10-17 DIAGNOSIS — G8918 Other acute postprocedural pain: Secondary | ICD-10-CM

## 2024-10-17 DIAGNOSIS — G8929 Other chronic pain: Secondary | ICD-10-CM

## 2024-10-17 DIAGNOSIS — M797 Fibromyalgia: Secondary | ICD-10-CM | POA: Diagnosis not present

## 2024-10-17 MED ORDER — OXYCODONE HCL 5 MG PO TABS: 5.0000 mg | ORAL_TABLET | Freq: Two times a day (BID) | ORAL | 0 refills | Status: AC | PRN

## 2024-10-17 MED ORDER — MORPHINE SULFATE ER 30 MG PO TBCR: 30.0000 mg | EXTENDED_RELEASE_TABLET | Freq: Two times a day (BID) | ORAL | 0 refills | Status: AC | PRN

## 2024-10-17 MED ORDER — MELOXICAM 15 MG PO TABS: 15.0000 mg | ORAL_TABLET | Freq: Every day | ORAL | 3 refills | Status: AC

## 2024-10-17 NOTE — Progress Notes (Signed)
" ° °  Subjective:    Patient ID: Ashley [name], female    DOB: 1960/10/30, 64 y.o.   MRN: [id number]  HPI Here for pain management. Her fibromyalgia has been stable, but her low back pain has been worse. She will see Dr. Louis later this week, and she will ask him about getting another epidural steroid injection.    Review of Systems  Constitutional: Negative.   Musculoskeletal:  Positive for back pain and myalgias.       Objective:   Physical Exam Constitutional:      Appearance: Normal appearance.  Neurological:     Mental Status: She is alert.           Assessment & Plan:  Pain management.  Indication for chronic opioid: fibromyalgia  Medication and dose: MS Contin  30 mg and Oxycodone  5 mg  # pills per month: 60 and 60 Last UDS date: 10-17-24 Opioid Treatment Agreement signed (Y/N): 11-02-18 Opioid Treatment Agreement last reviewed with patient:  10-17-24 [facility] reviewed this encounter (include red flags): Yes Meds were refilled.  Garnette Olmsted, MD   "

## 2024-10-17 NOTE — Addendum Note (Signed)
 Addended by: LADONNA INOCENTE SAILOR on: 10/17/2024 04:41 PM   Modules accepted: Orders

## 2024-10-22 LAB — DRUG MONITOR, PANEL 1, W/CONF, URINE
Alphahydroxyalprazolam: 486 ng/mL — ABNORMAL HIGH
Alphahydroxymidazolam: NEGATIVE ng/mL
Alphahydroxytriazolam: NEGATIVE ng/mL
Aminoclonazepam: NEGATIVE ng/mL
Amphetamines: NEGATIVE ng/mL
Barbiturates: NEGATIVE ng/mL
Benzodiazepines: POSITIVE ng/mL — AB
Cocaine Metabolite: NEGATIVE ng/mL
Codeine: 59 ng/mL — ABNORMAL HIGH
Creatinine: 195.6 mg/dL
Hydrocodone: NEGATIVE ng/mL
Hydromorphone: 640 ng/mL — ABNORMAL HIGH
Hydroxyethylflurazepam: NEGATIVE ng/mL
Lorazepam: NEGATIVE ng/mL
Marijuana Metabolite: 40 ng/mL — ABNORMAL HIGH
Marijuana Metabolite: POSITIVE ng/mL — AB
Methadone Metabolite: NEGATIVE ng/mL
Morphine: >10000 ng/mL — ABNORMAL HIGH
Nordiazepam: NEGATIVE ng/mL
Norhydrocodone: NEGATIVE ng/mL
Noroxycodone: 3032 ng/mL — ABNORMAL HIGH
Opiates: POSITIVE ng/mL — AB
Oxazepam: NEGATIVE ng/mL
Oxidant: NEGATIVE ug/mL
Oxycodone: 1106 ng/mL — ABNORMAL HIGH
Oxycodone: POSITIVE ng/mL — AB
Oxymorphone: 144 ng/mL — ABNORMAL HIGH
Phencyclidine: NEGATIVE ng/mL
Temazepam: NEGATIVE ng/mL
pH: 6 (ref 4.5–9.0)

## 2024-10-22 LAB — DM TEMPLATE

## 2024-10-24 ENCOUNTER — Other Ambulatory Visit: Payer: Self-pay | Admitting: Family Medicine

## 2025-01-02 ENCOUNTER — Encounter: Admitting: Family Medicine

## 2025-01-15 ENCOUNTER — Ambulatory Visit: Admitting: Family Medicine
